# Patient Record
Sex: Male | Born: 1955 | Race: White | Hispanic: No | Smoking: Former smoker
Health system: Southern US, Community
[De-identification: ages and names within clinical notes are randomized; demographics above are authoritative.]

## PROBLEM LIST (undated history)

## (undated) DIAGNOSIS — E785 Hyperlipidemia, unspecified: Secondary | ICD-10-CM

## (undated) DIAGNOSIS — K859 Acute pancreatitis without necrosis or infection, unspecified: Secondary | ICD-10-CM

## (undated) DIAGNOSIS — N393 Stress incontinence (female) (male): Secondary | ICD-10-CM

## (undated) DIAGNOSIS — C61 Malignant neoplasm of prostate: Secondary | ICD-10-CM

## (undated) DIAGNOSIS — N529 Male erectile dysfunction, unspecified: Secondary | ICD-10-CM

## (undated) DIAGNOSIS — Z8669 Personal history of other diseases of the nervous system and sense organs: Secondary | ICD-10-CM

## (undated) DIAGNOSIS — E119 Type 2 diabetes mellitus without complications: Secondary | ICD-10-CM

## (undated) DIAGNOSIS — H269 Unspecified cataract: Secondary | ICD-10-CM

## (undated) DIAGNOSIS — Z8719 Personal history of other diseases of the digestive system: Secondary | ICD-10-CM

## (undated) DIAGNOSIS — I10 Essential (primary) hypertension: Secondary | ICD-10-CM

## (undated) DIAGNOSIS — M1990 Unspecified osteoarthritis, unspecified site: Secondary | ICD-10-CM

## (undated) DIAGNOSIS — K219 Gastro-esophageal reflux disease without esophagitis: Secondary | ICD-10-CM

## (undated) DIAGNOSIS — R51 Headache: Secondary | ICD-10-CM

## (undated) DIAGNOSIS — U071 COVID-19: Secondary | ICD-10-CM

## (undated) DIAGNOSIS — IMO0001 Reserved for inherently not codable concepts without codable children: Secondary | ICD-10-CM

## (undated) DIAGNOSIS — IMO0002 Reserved for concepts with insufficient information to code with codable children: Secondary | ICD-10-CM

## (undated) HISTORY — DX: Stress incontinence (female) (male): N39.3

## (undated) HISTORY — PX: INGUINAL HERNIA REPAIR: SUR1180

## (undated) HISTORY — PX: CHOLECYSTECTOMY, LAPAROSCOPIC: SHX56

## (undated) HISTORY — DX: Reserved for concepts with insufficient information to code with codable children: IMO0002

## (undated) HISTORY — PX: CATARACT EXTRACTION: SUR2

## (undated) HISTORY — DX: Reserved for inherently not codable concepts without codable children: IMO0001

---

## 1988-06-05 HISTORY — PX: CHOLECYSTECTOMY: SHX55

## 2011-01-09 DIAGNOSIS — C61 Malignant neoplasm of prostate: Secondary | ICD-10-CM

## 2011-01-09 HISTORY — DX: Malignant neoplasm of prostate: C61

## 2011-01-11 ENCOUNTER — Other Ambulatory Visit (HOSPITAL_COMMUNITY): Payer: Self-pay | Admitting: Urology

## 2011-01-11 DIAGNOSIS — C61 Malignant neoplasm of prostate: Secondary | ICD-10-CM

## 2011-01-19 ENCOUNTER — Encounter (HOSPITAL_COMMUNITY)
Admission: RE | Admit: 2011-01-19 | Discharge: 2011-01-19 | Disposition: A | Discharge status: Home / Self Care | Payer: Managed Care, Other (non HMO) | Source: Ambulatory Visit | Attending: Urology | Admitting: Urology

## 2011-01-19 ENCOUNTER — Encounter (HOSPITAL_COMMUNITY): Payer: Self-pay

## 2011-01-19 DIAGNOSIS — C61 Malignant neoplasm of prostate: Secondary | ICD-10-CM | POA: Insufficient documentation

## 2011-01-19 HISTORY — DX: Malignant neoplasm of prostate: C61

## 2011-01-19 MED ORDER — TECHNETIUM TC 99M MEDRONATE IV KIT
23.2000 | PACK | Freq: Once | INTRAVENOUS | Status: AC | PRN
  Administered 2011-01-19: 23.2 via INTRAVENOUS

## 2011-01-20 ENCOUNTER — Ambulatory Visit (HOSPITAL_COMMUNITY)
Admission: RE | Admit: 2011-01-20 | Discharge: 2011-01-20 | Disposition: A | Discharge status: Home / Self Care | Payer: Managed Care, Other (non HMO) | Source: Ambulatory Visit | Attending: Urology | Admitting: Urology

## 2011-01-20 DIAGNOSIS — C61 Malignant neoplasm of prostate: Secondary | ICD-10-CM | POA: Insufficient documentation

## 2011-01-20 DIAGNOSIS — N402 Nodular prostate without lower urinary tract symptoms: Secondary | ICD-10-CM | POA: Insufficient documentation

## 2011-01-20 MED ORDER — GADOBENATE DIMEGLUMINE 529 MG/ML IV SOLN
10.0000 mL | Freq: Once | INTRAVENOUS | Status: AC | PRN
  Administered 2011-01-20: 10 mL via INTRAVENOUS

## 2011-02-24 ENCOUNTER — Encounter (HOSPITAL_COMMUNITY): Payer: Managed Care, Other (non HMO)

## 2011-02-24 ENCOUNTER — Other Ambulatory Visit: Payer: Self-pay | Admitting: Urology

## 2011-02-24 ENCOUNTER — Ambulatory Visit (HOSPITAL_COMMUNITY)
Admission: RE | Admit: 2011-02-24 | Discharge: 2011-02-24 | Disposition: A | Discharge status: Home / Self Care | Payer: Managed Care, Other (non HMO) | Source: Ambulatory Visit | Attending: Urology | Admitting: Urology

## 2011-02-24 ENCOUNTER — Other Ambulatory Visit (HOSPITAL_COMMUNITY): Payer: Self-pay | Admitting: Urology

## 2011-02-24 DIAGNOSIS — C61 Malignant neoplasm of prostate: Secondary | ICD-10-CM | POA: Insufficient documentation

## 2011-02-24 DIAGNOSIS — Z01818 Encounter for other preprocedural examination: Secondary | ICD-10-CM

## 2011-02-24 DIAGNOSIS — Z01812 Encounter for preprocedural laboratory examination: Secondary | ICD-10-CM | POA: Insufficient documentation

## 2011-02-24 DIAGNOSIS — Z01811 Encounter for preprocedural respiratory examination: Secondary | ICD-10-CM | POA: Insufficient documentation

## 2011-02-24 LAB — BASIC METABOLIC PANEL
CO2: 27 mEq/L (ref 19–32)
Calcium: 10.3 mg/dL (ref 8.4–10.5)
Glucose, Bld: 118 mg/dL — ABNORMAL HIGH (ref 70–99)
Potassium: 4.4 mEq/L (ref 3.5–5.1)
Sodium: 138 mEq/L (ref 135–145)

## 2011-02-24 LAB — CBC
Hemoglobin: 15.1 g/dL (ref 13.0–17.0)
MCH: 29.5 pg (ref 26.0–34.0)
RBC: 5.12 MIL/uL (ref 4.22–5.81)

## 2011-02-24 LAB — SURGICAL PCR SCREEN: Staphylococcus aureus: NEGATIVE

## 2011-03-02 ENCOUNTER — Other Ambulatory Visit: Payer: Self-pay | Admitting: Urology

## 2011-03-02 ENCOUNTER — Inpatient Hospital Stay (HOSPITAL_COMMUNITY)
Admission: RE | Admit: 2011-03-02 | Discharge: 2011-03-03 | DRG: 708 | Disposition: A | Discharge status: Home / Self Care | Payer: Managed Care, Other (non HMO) | Source: Ambulatory Visit | Attending: Urology | Admitting: Urology

## 2011-03-02 DIAGNOSIS — E785 Hyperlipidemia, unspecified: Secondary | ICD-10-CM | POA: Diagnosis present

## 2011-03-02 DIAGNOSIS — E119 Type 2 diabetes mellitus without complications: Secondary | ICD-10-CM | POA: Diagnosis present

## 2011-03-02 DIAGNOSIS — I10 Essential (primary) hypertension: Secondary | ICD-10-CM | POA: Diagnosis present

## 2011-03-02 DIAGNOSIS — Z87891 Personal history of nicotine dependence: Secondary | ICD-10-CM

## 2011-03-02 DIAGNOSIS — C61 Malignant neoplasm of prostate: Principal | ICD-10-CM | POA: Diagnosis present

## 2011-03-02 HISTORY — PX: PROSTATE SURGERY: SHX751

## 2011-03-02 LAB — ABO/RH: ABO/RH(D): O POS

## 2011-03-02 LAB — TYPE AND SCREEN: ABO/RH(D): O POS

## 2011-03-02 LAB — HEMOGLOBIN AND HEMATOCRIT, BLOOD: Hemoglobin: 13.9 g/dL (ref 13.0–17.0)

## 2011-03-02 LAB — GLUCOSE, CAPILLARY: Glucose-Capillary: 212 mg/dL — ABNORMAL HIGH (ref 70–99)

## 2011-03-03 LAB — GLUCOSE, CAPILLARY
Glucose-Capillary: 192 mg/dL — ABNORMAL HIGH (ref 70–99)
Glucose-Capillary: 152 mg/dL — ABNORMAL HIGH (ref 70–99)

## 2011-03-06 NOTE — Op Note (Signed)
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  PROCEDURES: 1. Robotic-assisted laparoscopic radical prostatectomy (non-nerve     sparing). 2. Bilateral robotic-assisted extended pelvic lymphadenectomy.  SURGEON:  Heloise Purpura, MD  ASSISTANT:  Delia Chimes, [facility]  ANESTHESIA:  General.  COMPLICATIONS:  None.  ESTIMATED BLOOD LOSS:  100 cc.  INTRAVENOUS FLUIDS:  1500 cc of crystalloid.  SPECIMEN: 1. Prostate and seminal vesicles. 2. Right external iliac lymph nodes. 3. Right pelvic lymph nodes. 4. Left external iliac lymph nodes. 5. Left pelvic lymph nodes.  DISPOSITION:  Specimens to Pathology.  DRAINS: 1. 20-French coude catheter. 2. #19 Blake pelvic drain.  INDICATION:  Douglas Zhang is a 55 year old gentleman who was recently diagnosed with locally advanced adenocarcinoma of prostate.  He was not found to have any evidence of metastatic disease on staging evaluation and we discussed options, which included primary surgical therapy versus primary radiotherapy, understanding that he likely would require a multimodality approach.  Considering his age and excellent life expectancy, he elected to proceed with primary surgical therapy.  The above procedures were discussed including the potential risks and complications and he elected to proceed and gave his informed consent.  DESCRIPTION OF PROCEDURE:  The patient was taken to the operating room and a general anesthetic was administered.  He was given  preoperative antibiotics, placed in the dorsal lithotomy position, and prepped and draped in the usual sterile fashion.  Next, preoperative time-out was performed.  A Foley catheter was then inserted into the bladder and a site was selected just superior to the umbilicus for placement of the camera port.  This was placed using a standard open Hassan technique, which allowed entry into the peritoneal cavity under direct vision without difficulty.  A 12 mm port was then placed and the pneumoperitoneum established.  With the 0-degree lens, the abdomen was inspected and there was no evidence of any intra-abdominal injuries. There were noted to be some adhesions toward the left inguinal canal, likely a result of his prior inguinal hernia repairs, which had been performed bilaterally.  The remaining ports were then placed with 8 mm robotic ports placed in the left lower quadrant, far left lower quadrant, and right lower quadrant.  A 12 mm port was placed in the far right lateral abdominal wall and a 5-mm port was placed in the right upper quadrant.  All ports were placed under direct vision without difficulty.  The surgical cart was then docked.  The aforementioned adhesions in the left lower quadrant were then carefully taken down sharply and the bladder was then reflected posteriorly with the aid of cautery scissors allowing entry into the space of Retzius and identification of the endopelvic  fascia and prostate.  The endopelvic fascia was then incised from the apex back to the base of the prostate bilaterally and the underlying levator muscle fibers were swept laterally off the prostate thereby isolating the dorsal venous complex. The dorsal vein was then stapled and divided with a 45 mm flex echelon stapler.  The bladder neck was identified with the aid of Foley catheter manipulation and was divided anteriorly thereby exposing the catheter. The catheter balloon was deflated and the catheter  was brought into the operative field and used to retract the prostate anteriorly.  This exposed the posterior bladder neck, which appeared unremarkable and it was divided and dissection proceeded between the bladder and prostate until the vasa deferentia and seminal vesicles were identified.  The vasa deferentia were isolated, divided and lifted anteriorly and the seminal vesicles were dissected down to their tips with care to control seminal vesicle arterial blood supply.  These structures were then also lifted anteriorly and the space between the Denonvilliers fascia and the anterior rectum was developed with a combination of sharp and blunt dissection.  Due to the extent of the patient's disease and his preexisting erectile dysfunction, a wide non-nerve sparing dissection was performed bilaterally.  The vascular pedicles of the prostate were ligated with Hem-o-lok clips and divided with sharp scissor dissection. Toward the apex of the prostate, bipolar energy was used for hemostatic purposes.  The urethra was then sharply divided allowing the prostate specimen to be disarticulated.  The pelvis was copiously irrigated and hemostasis was ensured.  With the irrigation in the pelvis, air was injected into the rectal catheter.  There was no evidence of a rectal injury.  Attention then turned to the right pelvic sidewall.  The fibrofatty tissue extending from the genitofemoral nerve laterally to the bifurcation of the iliac vessels proximally, the bladder medially and the hypogastric artery inferiorly and to  Cooper's ligament distally was removed with care to preserve the obturator nerve and vascular structures.  Hem-o-lok clips were used for lymphostasis and hemostasis. These packets were sent separately as external iliac lymph nodes and pelvic lymph nodes.  An identical procedure was performed on the contralateral side in a similar fashion.  Attention then returned to the pelvis.  The  urethral anastomosis was performed after a 2-0 Vicryl slip- knot was placed between Denonvilliers fascia, the posterior bladder neck, and the posterior urethra to reapproximate these structures.  A double-armed 3-0 Monocryl suture was then used to perform a 360 degrees running tension-free anastomosis between these structures.  A new 20- [place] coude catheter was inserted into the bladder and irrigated. There were no blood clots within the bladder and the anastomosis appeared to be watertight.  A #19 Blake drain was then brought through the left robotic port and positioned appropriately within the pelvis. It was secured to the skin with a nylon suture.  Surgical cart was then undocked.  The right lateral 12 mm port site was closed with a 0 Vicryl suture placed laparoscopically.  All remaining ports were then removed under direct vision and the prostate specimen was removed intact via the periumbilical port site within the Endopouch retrieval bag.  This fascial opening was then closed with 2 running 0 Vicryl sutures.  All port sites were then injected with 4% Marcaine and reapproximated at the skin level with staples.  Sterile dressings were applied.  The patient tolerated the procedure well without complications.  He was able to be extubated and transferred to recovery unit in satisfactory  condition.     Heloise Purpura, MD     LB/MEDQ  D:  03/02/2011  T:  03/02/2011  Job:  [id number]  Electronically Signed by Heloise Purpura MD on 03/06/2011 10:31:16 AM

## 2011-12-27 ENCOUNTER — Encounter: Payer: Self-pay | Admitting: Gastroenterology

## 2012-01-31 ENCOUNTER — Ambulatory Visit (AMBULATORY_SURGERY_CENTER): Payer: BC Managed Care – PPO

## 2012-01-31 VITALS — Ht 70.0 in | Wt 206.6 lb

## 2012-01-31 DIAGNOSIS — Z8546 Personal history of malignant neoplasm of prostate: Secondary | ICD-10-CM

## 2012-01-31 DIAGNOSIS — Z1211 Encounter for screening for malignant neoplasm of colon: Secondary | ICD-10-CM

## 2012-01-31 MED ORDER — MOVIPREP 100 G PO SOLR: ORAL | Status: DC

## 2012-02-01 ENCOUNTER — Encounter: Payer: Self-pay | Admitting: Gastroenterology

## 2012-02-13 ENCOUNTER — Telehealth: Payer: Self-pay | Admitting: Gastroenterology

## 2012-02-13 NOTE — Telephone Encounter (Signed)
Questions about the prep and his diabetic meds answered.  No further questions

## 2012-02-14 ENCOUNTER — Encounter: Payer: Self-pay | Admitting: Gastroenterology

## 2012-02-14 ENCOUNTER — Ambulatory Visit (AMBULATORY_SURGERY_CENTER): Payer: BC Managed Care – PPO | Admitting: Gastroenterology

## 2012-02-14 VITALS — BP 134/91 | HR 96 | Temp 97.4°F | Resp 20 | Ht 70.0 in | Wt 206.0 lb

## 2012-02-14 DIAGNOSIS — D126 Benign neoplasm of colon, unspecified: Secondary | ICD-10-CM

## 2012-02-14 DIAGNOSIS — Z1211 Encounter for screening for malignant neoplasm of colon: Secondary | ICD-10-CM

## 2012-02-14 MED ORDER — SODIUM CHLORIDE 0.9 % IV SOLN: 500.0000 mL | INTRAVENOUS | Status: DC

## 2012-02-14 NOTE — Progress Notes (Signed)
Patient did not experience any of the following events: a burn prior to discharge; a fall within the facility; wrong site/side/patient/procedure/implant event; or a hospital transfer or hospital admission upon discharge from the facility. (G8907) Patient did not have preoperative order for IV antibiotic SSI prophylaxis. (G8918)  

## 2012-02-14 NOTE — Patient Instructions (Signed)
YOU HAD AN ENDOSCOPIC PROCEDURE TODAY AT THE [facility]: Refer to the procedure report that was given to you for any specific questions about what was found during the examination.  If the procedure report does not answer your questions, please call your gastroenterologist to clarify.  If you requested that your care partner not be given the details of your procedure findings, then the procedure report has been included in a sealed envelope for you to review at your convenience later.  YOU SHOULD EXPECT: Some feelings of bloating in the abdomen. Passage of more gas than usual.  Walking can help get rid of the air that was put into your GI tract during the procedure and reduce the bloating. If you had a lower endoscopy (such as a colonoscopy or flexible sigmoidoscopy) you may notice spotting of blood in your stool or on the toilet paper. If you underwent a bowel prep for your procedure, then you may not have a normal bowel movement for a few days.  DIET: Your first meal following the procedure should be a light meal and then it is ok to progress to your normal diet.  A half-sandwich or bowl of soup is an example of a good first meal.  Heavy or fried foods are harder to digest and may make you feel nauseous or bloated.  Likewise meals heavy in dairy and vegetables can cause extra gas to form and this can also increase the bloating.  Drink plenty of fluids but you should avoid alcoholic beverages for 24 hours.  ACTIVITY: Your care partner should take you home directly after the procedure.  You should plan to take it easy, moving slowly for the rest of the day.  You can resume normal activity the day after the procedure however you should NOT DRIVE or use heavy machinery for 24 hours (because of the sedation medicines used during the test).    SYMPTOMS TO REPORT IMMEDIATELY: A gastroenterologist can be reached at any hour.  During normal business hours, 8:30 AM to 5:00 PM Monday through Friday,  call [phone].  After hours and on weekends, please call the GI answering service at [phone] who will take a message and have the physician on call contact you.   Following lower endoscopy (colonoscopy or flexible sigmoidoscopy):  Excessive amounts of blood in the stool  Significant tenderness or worsening of abdominal pains  Swelling of the abdomen that is new, acute  Fever of 100F or higher    FOLLOW UP: If any biopsies were taken you will be contacted by phone or by letter within the next 1-3 weeks.  Call your gastroenterologist if you have not heard about the biopsies in 3 weeks.  Our staff will call the home number listed on your records the next business day following your procedure to check on you and address any questions or concerns that you may have at that time regarding the information given to you following your procedure. This is a courtesy call and so if there is no answer at the home number and we have not heard from you through the emergency physician on call, we will assume that you have returned to your regular daily activities without incident.  SIGNATURES/CONFIDENTIALITY: You and/or your care partner have signed paperwork which will be entered into your electronic medical record.  These signatures attest to the fact that that the information above on your After Visit Summary has been reviewed and is understood.  Full responsibility of the confidentiality   of this discharge information lies with you and/or your care-partner.     

## 2012-02-14 NOTE — Op Note (Addendum)
[facility] [address] [place], [address]   COLONOSCOPY PROCEDURE REPORT PATIENT: Douglas Zhang, Douglas Zhang  MR#: [id number] BIRTHDATE: Jan 11, 1956 , 55  yrs. old GENDER: Male ENDOSCOPIST: Meryl Dare, MD, [facility] REFERRED MV:HQION Duaine Dredge, M.D. PROCEDURE DATE:  02/14/2012 PROCEDURE:   Colonoscopy with snare polypectomy ASA CLASS:   Class III INDICATIONS:average risk screening. MEDICATIONS: MAC sedation, administered by CRNA and propofol (Diprivan) 280mg  IV DESCRIPTION OF PROCEDURE:   After the risks benefits and alternatives of the procedure were thoroughly explained, informed consent was obtained.  A digital rectal exam revealed no abnormalities of the rectum.   The LB CF-H180AL [id number]  endoscope was introduced through the anus and advanced to the cecum, which was identified by both the appendix and ileocecal valve. No adverse events experienced.   The quality of the prep was adequate, using MoviPrep  The instrument was then slowly withdrawn as the colon was fully examined.   COLON FINDINGS: A sessile polyp measuring 6 mm in size was found in the ascending colon.  A polypectomy was performed with a cold snare.  The resection was complete and the polyp tissue was completely retrieved.   A pedunculated polyp measuring 1 cm in size was found at the hepatic flexure.  A polypectomy was performed using snare cautery.  The resection was complete and the polyp tissue was completely retrieved.   Two sessile polyps measuring 5-7 mm in size were found in the descending colon.  A polypectomy was performed with a cold snare.  The resection was complete and the polyp tissue was completely retrieved.   The colon was otherwise normal.  There was no diverticulosis, inflamation, polyps or cancers unless previously stated.  Retroflexed views revealed internal hemorrhoids. The time to cecum=1 minutes 54 seconds. Withdrawal time=12 minutes 44 seconds.  The scope was withdrawn and the  procedure completed. COMPLICATIONS: There were no complications.  ENDOSCOPIC IMPRESSION: 1.   Sessile polyp in the ascending colon; polypectomy was performed with a cold snare 2.   Pedunculated polyp at the hepatic flexure; polypectomy was performed using snare cautery 3.   Two sessile polyps found in the descending colon; polypectomy was performed with a cold snare 4.   Internal hemorrhoids  RECOMMENDATIONS: 1.  await pathology results 2.  Repeat colonoscopy in 3 years if the 1 cm polyp is adenomatous or 3 polyps are adenomatous; 5 years if 1-2 smaller polyps adenomatous, otherwise 10 years eSigned:  Meryl Dare, MD, [facility] 02/14/2012 9:32 AM Revised: 02/14/2012 9:32 AM

## 2012-02-15 ENCOUNTER — Telehealth: Payer: Self-pay | Admitting: *Deleted

## 2012-02-15 NOTE — Telephone Encounter (Signed)
  Follow up Call-  Call back number 02/14/2012  Post procedure Call Back phone  # [phone]  Permission to leave phone message Yes     Patient questions:  Do you have a fever, pain , or abdominal swelling? no Pain Score  0 *  Have you tolerated food without any problems? yes  Have you been able to return to your normal activities? yes  Do you have any questions about your discharge instructions: Diet   no Medications  no Follow up visit  no  Do you have questions or concerns about your Care? no  Actions: * If pain score is 4 or above: No action needed, pain <4.

## 2012-02-19 ENCOUNTER — Encounter: Payer: Self-pay | Admitting: Gastroenterology

## 2012-02-27 ENCOUNTER — Other Ambulatory Visit: Payer: Self-pay | Admitting: Family Medicine

## 2012-02-27 DIAGNOSIS — R1110 Vomiting, unspecified: Secondary | ICD-10-CM

## 2012-02-27 DIAGNOSIS — R1310 Dysphagia, unspecified: Secondary | ICD-10-CM

## 2012-03-06 ENCOUNTER — Ambulatory Visit
Admission: RE | Admit: 2012-03-06 | Discharge: 2012-03-06 | Disposition: A | Discharge status: Home / Self Care | Payer: BC Managed Care – PPO | Source: Ambulatory Visit | Attending: Family Medicine | Admitting: Family Medicine

## 2012-03-06 DIAGNOSIS — R1310 Dysphagia, unspecified: Secondary | ICD-10-CM

## 2012-03-06 DIAGNOSIS — R1110 Vomiting, unspecified: Secondary | ICD-10-CM

## 2012-06-17 ENCOUNTER — Encounter: Payer: Self-pay | Admitting: *Deleted

## 2012-06-17 ENCOUNTER — Encounter: Payer: Self-pay | Admitting: Radiation Oncology

## 2012-06-17 DIAGNOSIS — N393 Stress incontinence (female) (male): Secondary | ICD-10-CM | POA: Insufficient documentation

## 2012-06-17 DIAGNOSIS — C61 Malignant neoplasm of prostate: Secondary | ICD-10-CM | POA: Insufficient documentation

## 2012-06-17 NOTE — Progress Notes (Signed)
New Consult Prostate Cancer; Elevated PSA's s/p radical Prostatectomy 03/02/11 Gleason =4+4=8.PSA=8.39,volume=50cc PSA 06/09/11=0.64 PSA 05/15/12=<0.01  Single,no children, no  down to 1-2 pads daily,occaional dysuria, nocturia 2x sometimes, weak stream, "sometimes a little bit comes out when voiding"stated patient regular bowel movements, some fatigue, Hot flashes s/p androgen deprivation therapy   Allergies:NKDA

## 2012-06-18 ENCOUNTER — Ambulatory Visit
Admission: RE | Admit: 2012-06-18 | Discharge: 2012-06-18 | Disposition: A | Discharge status: Home / Self Care | Payer: BC Managed Care – PPO | Source: Ambulatory Visit | Attending: Radiation Oncology | Admitting: Radiation Oncology

## 2012-06-18 ENCOUNTER — Encounter: Payer: Self-pay | Admitting: Radiation Oncology

## 2012-06-18 VITALS — BP 119/73 | HR 109 | Temp 97.6°F | Resp 20 | Ht 69.0 in | Wt 202.2 lb

## 2012-06-18 DIAGNOSIS — C61 Malignant neoplasm of prostate: Secondary | ICD-10-CM

## 2012-06-18 DIAGNOSIS — Z7982 Long term (current) use of aspirin: Secondary | ICD-10-CM | POA: Insufficient documentation

## 2012-06-18 DIAGNOSIS — N393 Stress incontinence (female) (male): Secondary | ICD-10-CM | POA: Insufficient documentation

## 2012-06-18 DIAGNOSIS — E119 Type 2 diabetes mellitus without complications: Secondary | ICD-10-CM | POA: Insufficient documentation

## 2012-06-18 DIAGNOSIS — I10 Essential (primary) hypertension: Secondary | ICD-10-CM | POA: Insufficient documentation

## 2012-06-18 DIAGNOSIS — Z79899 Other long term (current) drug therapy: Secondary | ICD-10-CM | POA: Insufficient documentation

## 2012-06-18 HISTORY — DX: Unspecified cataract: H26.9

## 2012-06-18 HISTORY — DX: Essential (primary) hypertension: I10

## 2012-06-18 HISTORY — DX: Personal history of other diseases of the nervous system and sense organs: Z86.69

## 2012-06-18 HISTORY — DX: Male erectile dysfunction, unspecified: N52.9

## 2012-06-18 HISTORY — DX: Type 2 diabetes mellitus without complications: E11.9

## 2012-06-18 HISTORY — DX: Acute pancreatitis without necrosis or infection, unspecified: K85.90

## 2012-06-18 HISTORY — DX: Hyperlipidemia, unspecified: E78.5

## 2012-06-18 HISTORY — DX: Gastro-esophageal reflux disease without esophagitis: K21.9

## 2012-06-18 NOTE — Progress Notes (Signed)
Please see the Nurse Progress Note in the MD Initial Consult Encounter for this patient. 

## 2012-06-18 NOTE — Progress Notes (Signed)
[facility] Radiation Oncology NEW PATIENT EVALUATION  Name: Douglas Zhang MRN: [id number]  Date:   06/18/2012           DOB: 1956/05/17  Status: outpatient   CC: Carolyne Fiscal, MD  Crecencio Mc, MD    REFERRING PHYSICIAN: Crecencio Mc, MD   DIAGNOSIS: Pathologic stage T3a adenocarcinoma prostate   HISTORY OF PRESENT ILLNESS:  Douglas Zhang is a 57 y.o. male who is a most pleasant 57 year old male who is seen today for the courtesy of Dr. Laverle Patter for consideration of post-prostatectomy radiation therapy in the management of his pathologic stage T3a adenocarcinoma prostate. He initially presented with a PSA of 8.39 prompting a biopsies by Dr. Laverle Patter on 01/09/2011. His then have extensive Gleason 8 (4+4) involving 50% of one core from the left lateral base, 30% of one core from the left base, 80% of one core from left lateral mid gland, 60% of one core from the left mid gland, and 40% of one core from left lateral apex. He had other areas of high-grade PIN. His staging workup was negative. On 03/02/2011 he underwent a unilateral nerve sparing laparoscopic radical prostatectomy by Dr. Laverle Patter. He was found to have Gleason 8 (4+4) involving 15% of the gland, less than one half of the left lobe. There was extracapsular extension of carcinoma with focal involvement of the soft tissue surgical margin of resection in the area of the left seminal vesicle, however there was no involvement of the seminal vesicles. 25 lymph nodes were free of metastatic disease. His postop PSA in November of 2012 0.36, and rose to 0.64 by January of 2013. He was then started on androgen deprivation therapy/Lupron with his PSA is becoming undetectable since then. His last PSA was in December 2013. He was prese uses nted at our GU oncology conference, and it was felt that he may be a candidate for salvage radiation therapy. He had some difficulty with incontinence following his surgery but this is much improved. He states  that he uses 2 pads a day. He is doing better on VESIcare. No GI difficulties. He does admit to hot flashes as expected. He has not had any erections since surgery.  PREVIOUS RADIATION THERAPY: No   PAST MEDICAL HISTORY:  has a past medical history of Stress incontinence, male; Prostate cancer (01/09/11); migraines; Hypertension; Hyperlipidemia; Diabetes mellitus without complication; AP (acute pancreatitis); ED (erectile dysfunction); GERD (gastroesophageal reflux disease); and Cataract.     PAST SURGICAL HISTORY:  Past Surgical History  Procedure Date  . Prostate surgery 03/02/11    gleason 4+4=8  . Cholecystectomy, laparoscopic   . Inguinal hernia repair     bi-lateral  . Cholecystectomy 1990     FAMILY HISTORY: family history includes Cancer in his maternal grandmother and Stroke in his father. His father died of a stroke at 14 and his mother died from complications of Alzheimer's disease at 24. No family history of prostate cancer.   SOCIAL HISTORY:  reports that he quit smoking about 29 years ago. His smoking use included Cigarettes. He has a 5 pack-year smoking history. He has never used smokeless tobacco. He reports that he drinks alcohol. He reports that he does not use illicit drugs. Divorced, no children. 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   ALLERGIES: Review of patient's allergies indicates no known allergies.   MEDICATIONS:  Current Outpatient Prescriptions  Medication Sig Dispense Refill  . aspirin 81 MG tablet  Take 81 mg by mouth daily.      . calcium citrate-vitamin D (CITRACAL+D) 315-200 MG-UNIT per tablet Take 1 tablet by mouth 2 (two) times daily.      . cetirizine (ZYRTEC) 10 MG tablet Take 10 mg by mouth as needed.      Marland Kitchen glipiZIDE (GLUCOTROL XL) 5 MG 24 hr tablet Take 5 mg by mouth 2 (two) times daily.      Marland Kitchen lisinopril (PRINIVIL,ZESTRIL) 20 MG tablet Take 20 mg by mouth daily.      . metFORMIN (GLUCOPHAGE)  500 MG tablet Take 500 mg by mouth 4 (four) times daily.       . Multiple Vitamin (MULTIVITAMIN WITH MINERALS) TABS Take 1 tablet by mouth daily.      Marland Kitchen omeprazole (PRILOSEC) 20 MG capsule Take 20 mg by mouth daily.      . pravastatin (PRAVACHOL) 40 MG tablet Take 40 mg by mouth daily.      . solifenacin (VESICARE) 5 MG tablet Take 10 mg by mouth as directed.      . SUMAtriptan (IMITREX) 100 MG tablet Take 100 mg by mouth every 2 (two) hours as needed.         REVIEW OF SYSTEMS:  Pertinent items are noted in HPI.    PHYSICAL EXAM:  height is 5\' 9"  (1.753 m) and weight is 202 lb 3.2 oz (91.717 kg). His oral temperature is 97.6 F (36.4 C). His blood pressure is 119/73 and his pulse is 109. His respiration is 20.   Alert and oriented 57 year old white male appearing his stated age. Head and neck examination: Grossly unremarkable. Nodes: Without palpable cervical or supraclavicular lymphadenopathy. Chest: Lungs clear. Back: Without spinal or CVA tenderness. Heart: Regular in rhythm. Abdomen: Without masses organomegaly. Genitalia: Unremarkable to inspection. Rectal: Prostate bed without masses or nodularity. Extremities: Without edema. Neurologic examination: Grossly nonfocal.   LABORATORY DATA:  Lab Results  Component Value Date   WBC 6.6 02/24/2011   HGB 13.1 03/03/2011   HCT 38.8* 03/03/2011   MCV 86.1 02/24/2011   PLT 175 02/24/2011   Lab Results  Component Value Date   NA 138 02/24/2011   K 4.4 02/24/2011   CL 102 02/24/2011   CO2 27 02/24/2011   No results found for this basename: ALT, AST, GGT, ALKPHOS, BILITOT   PSA from 05/15/2012 undetectable.   IMPRESSION: Pathologic stage T3a N0 Gleason 8 (4+4) adenocarcinoma with a focal positive soft tissue margin the left seminal vesicle. I explained to the patient and his sister that the risk for local failure is increased with margin positivity. On the other hand, he has competing risks factors for disseminated disease based on his Gleason  score of 8. Likelihood for successful salvage his low, but in view of his young age, absence of nodal spread, and a positive margin I would be willing to offer him post prostatectomy/salvage radiation therapy. We discussed the potential acute and late toxicities of radiation therapy. We also discussed treatment with a comfortably full bladder if this is possible . Dr. Laverle Patter plans on holding off on any further and a deprivation therapy so we can follow his PSA. Consent is signed today. I will have her return by early next week for simulation/treatment planning.   PLAN: As discussed above.   I spent 60 minutes minutes face to face with the patient and more than 50% of that time was spent in counseling and/or coordination of care.

## 2012-06-24 ENCOUNTER — Ambulatory Visit
Admission: RE | Admit: 2012-06-24 | Discharge: 2012-06-24 | Disposition: A | Discharge status: Home / Self Care | Payer: BC Managed Care – PPO | Source: Ambulatory Visit | Attending: Radiation Oncology | Admitting: Radiation Oncology

## 2012-06-24 ENCOUNTER — Telehealth: Payer: Self-pay | Admitting: Radiation Oncology

## 2012-06-24 DIAGNOSIS — Z510 Encounter for antineoplastic radiation therapy: Secondary | ICD-10-CM | POA: Insufficient documentation

## 2012-06-24 DIAGNOSIS — C61 Malignant neoplasm of prostate: Secondary | ICD-10-CM | POA: Insufficient documentation

## 2012-06-24 DIAGNOSIS — R351 Nocturia: Secondary | ICD-10-CM | POA: Insufficient documentation

## 2012-06-24 NOTE — Progress Notes (Signed)
Simulation/treatment planning note: Mr. Douglas Zhang was taken to the CT simulator. A VAC LOC immobilization device was constructed. A red rubber cath was placed within the rectal vault. He was in catheterized and contrast instilled into the bladder/urethra. I contoured his high-risk prostate bed, CTV 6600 and also low risk prostate bed, CTV 5445. He should these were expanded by 0.5 cm to create respective planning target volumes. I contoured his bladder and rectum as well. I'm prescribing 6006 were centigrays to his high-risk prostate bed PTV and 5000 445 cGy to his low risk prostate bed PTV. I requesting daily MV CT, setting up to his anterior rectum. He is be treated with a comfortably full bladder. He is now ready for IMRT simulation/treatment planning.

## 2012-06-24 NOTE — Telephone Encounter (Signed)
Met w patient to discuss RO billing. Pt had no financial concerns today.  Dx: Prostate  Attending Rad: RM  Rad Tx: IMRT x 33

## 2012-06-27 ENCOUNTER — Encounter: Payer: Self-pay | Admitting: Radiation Oncology

## 2012-06-27 NOTE — Progress Notes (Signed)
IMRT simulation/treatment planning note:  The patient underwent IMRT simulation/treatment planning in the management of his carcinoma the prostate. IMRT was chosen to decrease the risk for both acute and late bladder and rectal toxicity compared to 3-D conformal or conventional radiation therapy. Dose fine histograms were obtained for the high-risk prostate bed and also avoidance structures including the bladder and rectum. We met our departmental guidelines. He will receive 6600 cGy in 33 sessions utilizing 6 MV photons, helical IMRT Tomotherapy. I requesting daily MV CT for image guidance, setting up to his anterior rectum. He is be treated with a comfortably full bladder.

## 2012-07-03 ENCOUNTER — Ambulatory Visit
Admission: RE | Admit: 2012-07-03 | Discharge: 2012-07-03 | Disposition: A | Discharge status: Home / Self Care | Payer: BC Managed Care – PPO | Source: Ambulatory Visit | Attending: Radiation Oncology | Admitting: Radiation Oncology

## 2012-07-03 ENCOUNTER — Encounter: Payer: Self-pay | Admitting: Radiation Oncology

## 2012-07-03 NOTE — Progress Notes (Signed)
Chart note: Mr. Douglas Zhang began his radiation therapy today to his prostate bed. He is being treated with 6 MV photons, helical IMRT. He is being treated to 6.9 delivered field widths corresponding to one set of IMRT treatment devices [phone]).

## 2012-07-04 ENCOUNTER — Ambulatory Visit
Admission: RE | Admit: 2012-07-04 | Discharge: 2012-07-04 | Disposition: A | Discharge status: Home / Self Care | Payer: BC Managed Care – PPO | Source: Ambulatory Visit | Attending: Radiation Oncology | Admitting: Radiation Oncology

## 2012-07-05 ENCOUNTER — Ambulatory Visit
Admission: RE | Admit: 2012-07-05 | Discharge: 2012-07-05 | Disposition: A | Discharge status: Home / Self Care | Payer: BC Managed Care – PPO | Source: Ambulatory Visit | Attending: Radiation Oncology | Admitting: Radiation Oncology

## 2012-07-08 ENCOUNTER — Ambulatory Visit
Admission: RE | Admit: 2012-07-08 | Discharge: 2012-07-08 | Disposition: A | Discharge status: Home / Self Care | Payer: BC Managed Care – PPO | Source: Ambulatory Visit | Attending: Radiation Oncology | Admitting: Radiation Oncology

## 2012-07-08 ENCOUNTER — Encounter: Payer: Self-pay | Admitting: Radiation Oncology

## 2012-07-08 VITALS — BP 129/86 | HR 92 | Temp 98.1°F | Ht 69.0 in | Wt 207.2 lb

## 2012-07-08 DIAGNOSIS — C61 Malignant neoplasm of prostate: Secondary | ICD-10-CM

## 2012-07-08 NOTE — Progress Notes (Signed)
Weekly Management Note:  Site: Prostate bed Current Dose:  800  cGy Projected Dose: 6600  cGy  Narrative: The patient is seen today for routine under treatment assessment. CBCT/MVCT images/port films were reviewed. The chart was reviewed.   Bladder filling satisfactory. No new GU or GI difficulties.  Physical Examination:  Filed Vitals:   07/08/12 1636  BP: 129/86  Pulse: 92  Temp: 98.1 F (36.7 C)  .  Weight: 207 lb 3.2 oz (93.985 kg). No change.   Impression: Tolerating radiation therapy well.  Plan: Continue radiation therapy as planned.

## 2012-07-08 NOTE — Progress Notes (Signed)
Douglas Zhang has received 4 fractions to pelvis for prostate cancer.  He denies any pain or marked change in his urination.

## 2012-07-09 ENCOUNTER — Ambulatory Visit
Admission: RE | Admit: 2012-07-09 | Discharge: 2012-07-09 | Disposition: A | Discharge status: Home / Self Care | Payer: BC Managed Care – PPO | Source: Ambulatory Visit | Attending: Radiation Oncology | Admitting: Radiation Oncology

## 2012-07-10 ENCOUNTER — Ambulatory Visit
Admission: RE | Admit: 2012-07-10 | Discharge: 2012-07-10 | Disposition: A | Discharge status: Home / Self Care | Payer: BC Managed Care – PPO | Source: Ambulatory Visit | Attending: Radiation Oncology | Admitting: Radiation Oncology

## 2012-07-11 ENCOUNTER — Ambulatory Visit
Admission: RE | Admit: 2012-07-11 | Discharge: 2012-07-11 | Disposition: A | Discharge status: Home / Self Care | Payer: BC Managed Care – PPO | Source: Ambulatory Visit | Attending: Radiation Oncology | Admitting: Radiation Oncology

## 2012-07-12 ENCOUNTER — Ambulatory Visit
Admission: RE | Admit: 2012-07-12 | Discharge: 2012-07-12 | Disposition: A | Discharge status: Home / Self Care | Payer: BC Managed Care – PPO | Source: Ambulatory Visit | Attending: Radiation Oncology | Admitting: Radiation Oncology

## 2012-07-14 ENCOUNTER — Encounter: Payer: Self-pay | Admitting: Radiation Oncology

## 2012-07-15 ENCOUNTER — Ambulatory Visit
Admission: RE | Admit: 2012-07-15 | Discharge: 2012-07-15 | Disposition: A | Discharge status: Home / Self Care | Payer: BC Managed Care – PPO | Source: Ambulatory Visit | Attending: Radiation Oncology | Admitting: Radiation Oncology

## 2012-07-15 ENCOUNTER — Encounter: Payer: Self-pay | Admitting: Radiation Oncology

## 2012-07-15 VITALS — BP 124/80 | HR 96 | Temp 98.0°F | Resp 20 | Wt 205.5 lb

## 2012-07-15 DIAGNOSIS — C61 Malignant neoplasm of prostate: Secondary | ICD-10-CM

## 2012-07-15 NOTE — Progress Notes (Signed)
Post sim ed completed w/pt and 2 family members. Gave pt "Radiation and You" booklet w/all pertinent information marked and discussed, re: fatigue, urinary issues/care, nutrition, pain. Pt and family verbalized understanding, no questions verbalized.

## 2012-07-15 NOTE — Progress Notes (Signed)
Pt denies pain, loss of appetite, urinary/bowel issues. He has urinary urgency which is his baseline. Post sim done, charted under pt education appt.

## 2012-07-15 NOTE — Progress Notes (Signed)
Weekly Management Note:  Site: Prostate bed Current Dose:  1800  cGy Projected Dose: 6600  cGy  Narrative: The patient is seen today for routine under treatment assessment. CBCT/MVCT images/port films were reviewed. The chart was reviewed.   Satisfactory bladder filling today. He does have intermittent fatigue which is common for him. No GU or GI difficulties.  Physical Examination:  Filed Vitals:   07/15/12 1419  BP: 124/80  Pulse: 96  Temp: 98 F (36.7 C)  Resp: 20  .  Weight: 205 lb 8 oz (93.214 kg). No change .  Impression: Tolerating radiation therapy well.  Plan: Continue radiation therapy as planned.

## 2012-07-16 ENCOUNTER — Ambulatory Visit
Admission: RE | Admit: 2012-07-16 | Discharge: 2012-07-16 | Disposition: A | Discharge status: Home / Self Care | Payer: BC Managed Care – PPO | Source: Ambulatory Visit | Attending: Radiation Oncology | Admitting: Radiation Oncology

## 2012-07-17 ENCOUNTER — Ambulatory Visit
Admission: RE | Admit: 2012-07-17 | Discharge: 2012-07-17 | Disposition: A | Discharge status: Home / Self Care | Payer: BC Managed Care – PPO | Source: Ambulatory Visit | Attending: Radiation Oncology | Admitting: Radiation Oncology

## 2012-07-18 ENCOUNTER — Ambulatory Visit: Payer: BC Managed Care – PPO

## 2012-07-19 ENCOUNTER — Ambulatory Visit
Admission: RE | Admit: 2012-07-19 | Discharge: 2012-07-19 | Disposition: A | Discharge status: Home / Self Care | Payer: BC Managed Care – PPO | Source: Ambulatory Visit | Attending: Radiation Oncology | Admitting: Radiation Oncology

## 2012-07-22 ENCOUNTER — Encounter: Payer: Self-pay | Admitting: Radiation Oncology

## 2012-07-22 ENCOUNTER — Ambulatory Visit
Admission: RE | Admit: 2012-07-22 | Discharge: 2012-07-22 | Disposition: A | Discharge status: Home / Self Care | Payer: BC Managed Care – PPO | Source: Ambulatory Visit | Attending: Radiation Oncology | Admitting: Radiation Oncology

## 2012-07-22 VITALS — BP 129/93 | HR 97 | Temp 97.5°F | Resp 20 | Wt 204.6 lb

## 2012-07-22 DIAGNOSIS — C61 Malignant neoplasm of prostate: Secondary | ICD-10-CM

## 2012-07-22 NOTE — Progress Notes (Signed)
Weekly Management Note:  Site: Prostate bed Current Dose:  2600  cGy Projected Dose: 6600  cGy  Narrative: The patient is seen today for routine under treatment assessment. CBCT/MVCT images/port films were reviewed. The chart was reviewed.   Bladder filling appears to be suboptimal today. He tells me he has difficulty maintaining a full bladder. He is on VESIcare 10 mg daily.  Physical Examination:  Filed Vitals:   07/22/12 1403  BP: 129/93  Pulse: 97  Temp: 97.5 F (36.4 C)  Resp: 20  .  Weight: 204 lb 9.6 oz (92.806 kg). No change  Impression: Tolerating radiation therapy well. I encouraged him to make an extra effort to try to keep his bladder full.  Plan: Continue radiation therapy as planned.

## 2012-07-22 NOTE — Progress Notes (Signed)
Pt denies pain, loss of appetite, bowel issues. He reports urinary urgency,  nocturia x 2-3, fatigue which he states is his usual since beginning hormone therapy.

## 2012-07-23 ENCOUNTER — Ambulatory Visit
Admission: RE | Admit: 2012-07-23 | Discharge: 2012-07-23 | Disposition: A | Discharge status: Home / Self Care | Payer: BC Managed Care – PPO | Source: Ambulatory Visit | Attending: Radiation Oncology | Admitting: Radiation Oncology

## 2012-07-24 ENCOUNTER — Ambulatory Visit
Admission: RE | Admit: 2012-07-24 | Discharge: 2012-07-24 | Disposition: A | Discharge status: Home / Self Care | Payer: BC Managed Care – PPO | Source: Ambulatory Visit | Attending: Radiation Oncology | Admitting: Radiation Oncology

## 2012-07-25 ENCOUNTER — Ambulatory Visit
Admission: RE | Admit: 2012-07-25 | Discharge: 2012-07-25 | Disposition: A | Discharge status: Home / Self Care | Payer: BC Managed Care – PPO | Source: Ambulatory Visit | Attending: Radiation Oncology | Admitting: Radiation Oncology

## 2012-07-26 ENCOUNTER — Ambulatory Visit
Admission: RE | Admit: 2012-07-26 | Discharge: 2012-07-26 | Disposition: A | Discharge status: Home / Self Care | Payer: BC Managed Care – PPO | Source: Ambulatory Visit | Attending: Radiation Oncology | Admitting: Radiation Oncology

## 2012-07-29 ENCOUNTER — Ambulatory Visit
Admission: RE | Admit: 2012-07-29 | Payer: BC Managed Care – PPO | Source: Ambulatory Visit | Admitting: Radiation Oncology

## 2012-07-29 ENCOUNTER — Encounter: Payer: Self-pay | Admitting: Radiation Oncology

## 2012-07-29 ENCOUNTER — Ambulatory Visit
Admission: RE | Admit: 2012-07-29 | Discharge: 2012-07-29 | Disposition: A | Discharge status: Home / Self Care | Payer: BC Managed Care – PPO | Source: Ambulatory Visit | Attending: Radiation Oncology | Admitting: Radiation Oncology

## 2012-07-29 VITALS — BP 139/72 | HR 79 | Temp 97.6°F | Wt 203.8 lb

## 2012-07-29 DIAGNOSIS — C61 Malignant neoplasm of prostate: Secondary | ICD-10-CM

## 2012-07-29 NOTE — Progress Notes (Signed)
Weekly Management Note:  Site: Prostate bed Current Dose:  3600  cGy Projected Dose: 6600  cGy  Narrative: The patient is seen today for routine under treatment assessment. CBCT/MVCT images/port films were reviewed. The chart was reviewed.   Satisfactory bladder filling. No new GU or GI difficulties although he does report nocturia x3-4.  Physical Examination:  Filed Vitals:   07/29/12 1411  BP: 139/72  Pulse: 79  Temp: 97.6 F (36.4 C)  .  Weight: 203 lb 12.8 oz (92.443 kg). No change.  Impression: Tolerating radiation therapy well.  Plan: Continue radiation therapy as planned.

## 2012-07-29 NOTE — Progress Notes (Signed)
States he notes increased frequency of urination with urgency as well as increase in nocturia from 2 x nightly to 3.  He also notes that when he voids he has to havea BM, but he denies any proctitis.

## 2012-07-30 ENCOUNTER — Ambulatory Visit
Admission: RE | Admit: 2012-07-30 | Discharge: 2012-07-30 | Disposition: A | Discharge status: Home / Self Care | Payer: BC Managed Care – PPO | Source: Ambulatory Visit | Attending: Radiation Oncology | Admitting: Radiation Oncology

## 2012-07-31 ENCOUNTER — Ambulatory Visit
Admission: RE | Admit: 2012-07-31 | Discharge: 2012-07-31 | Disposition: A | Discharge status: Home / Self Care | Payer: BC Managed Care – PPO | Source: Ambulatory Visit | Attending: Radiation Oncology | Admitting: Radiation Oncology

## 2012-08-01 ENCOUNTER — Ambulatory Visit
Admission: RE | Admit: 2012-08-01 | Discharge: 2012-08-01 | Disposition: A | Discharge status: Home / Self Care | Payer: BC Managed Care – PPO | Source: Ambulatory Visit | Attending: Radiation Oncology | Admitting: Radiation Oncology

## 2012-08-02 ENCOUNTER — Ambulatory Visit
Admission: RE | Admit: 2012-08-02 | Discharge: 2012-08-02 | Disposition: A | Discharge status: Home / Self Care | Payer: BC Managed Care – PPO | Source: Ambulatory Visit | Attending: Radiation Oncology | Admitting: Radiation Oncology

## 2012-08-05 ENCOUNTER — Ambulatory Visit
Admission: RE | Admit: 2012-08-05 | Discharge: 2012-08-05 | Disposition: A | Discharge status: Home / Self Care | Payer: BC Managed Care – PPO | Source: Ambulatory Visit | Attending: Radiation Oncology | Admitting: Radiation Oncology

## 2012-08-05 VITALS — BP 123/80 | HR 98 | Temp 97.7°F | Wt 202.6 lb

## 2012-08-05 DIAGNOSIS — C61 Malignant neoplasm of prostate: Secondary | ICD-10-CM

## 2012-08-05 NOTE — Progress Notes (Signed)
Douglas Zhang is here for his weekly under treatment visit.  He denies pain, incontinence, frequency of urination or trouble maintaining urinary stream.  He does state that he is getting up 4-5 times each night to urinate.  He also states that he does have fatigue.  He has been trying to take naps during the day.  He also has diarrhea once a day in the morning.  He thinks this may be due to what he is eating.

## 2012-08-05 NOTE — Progress Notes (Signed)
Weekly Management Note:  Site: Prostate bed Current Dose:  4600  cGy Projected Dose: 6600  cGy  Narrative: The patient is seen today for routine under treatment assessment. CBCT/MVCT images/port films were reviewed. The chart was reviewed.   Bladder filling is suboptimal today. Continues to have nocturia x4-5, and this is been a chronic problem predating his radiation therapy. No GI difficulties.  Physical Examination:  Filed Vitals:   08/05/12 1329  BP: 123/80  Pulse: 98  Temp: 97.7 F (36.5 C)  .  Weight: 202 lb 9.6 oz (91.899 kg). No change  Impression: Tolerating radiation therapy well. I encouraged him to improve his bladder filling.  Plan: Continue radiation therapy as planned.

## 2012-08-06 ENCOUNTER — Ambulatory Visit
Admission: RE | Admit: 2012-08-06 | Discharge: 2012-08-06 | Disposition: A | Discharge status: Home / Self Care | Payer: BC Managed Care – PPO | Source: Ambulatory Visit | Attending: Radiation Oncology | Admitting: Radiation Oncology

## 2012-08-07 ENCOUNTER — Ambulatory Visit
Admission: RE | Admit: 2012-08-07 | Discharge: 2012-08-07 | Disposition: A | Discharge status: Home / Self Care | Payer: BC Managed Care – PPO | Source: Ambulatory Visit | Attending: Radiation Oncology | Admitting: Radiation Oncology

## 2012-08-08 ENCOUNTER — Ambulatory Visit: Admission: RE | Admit: 2012-08-08 | Payer: BC Managed Care – PPO | Source: Ambulatory Visit

## 2012-08-09 ENCOUNTER — Ambulatory Visit: Admission: RE | Admit: 2012-08-09 | Payer: BC Managed Care – PPO | Source: Ambulatory Visit

## 2012-08-12 ENCOUNTER — Ambulatory Visit
Admission: RE | Admit: 2012-08-12 | Discharge: 2012-08-12 | Disposition: A | Discharge status: Home / Self Care | Payer: BC Managed Care – PPO | Source: Ambulatory Visit | Attending: Radiation Oncology | Admitting: Radiation Oncology

## 2012-08-12 ENCOUNTER — Encounter: Payer: Self-pay | Admitting: Radiation Oncology

## 2012-08-12 VITALS — BP 123/78 | HR 93 | Temp 97.5°F | Resp 20 | Wt 204.6 lb

## 2012-08-12 DIAGNOSIS — C61 Malignant neoplasm of prostate: Secondary | ICD-10-CM

## 2012-08-12 NOTE — Progress Notes (Signed)
Pt continues to have fatigue, states maybe one loose BM daily. He denies pain, urinary incontinence, dysuria. He states since beginning Vesicare he does not have incontinence but still has nocturia x 3-4.

## 2012-08-12 NOTE — Addendum Note (Signed)
Encounter addended by: Glennie Hawk, RN on: 08/12/2012  3:05 PM<BR>     Documentation filed: Inpatient Document Flowsheet

## 2012-08-12 NOTE — Progress Notes (Signed)
Weekly Management Note:  Site: Prostate bed Current Dose:  5200  cGy Projected Dose: 6600  cGy  Narrative: The patient is seen today for routine under treatment assessment. CBCT/MVCT images/port films were reviewed. The chart was reviewed.   Bladder filling is less than ideal today. This was reviewed with the patient. No change in his GU or GI habits.  Physical Examination:  Filed Vitals:   08/12/12 1347  BP: 123/78  Pulse: 93  Temp: 97.5 F (36.4 C)  Resp: 20  .  Weight: 204 lb 9.6 oz (92.806 kg). No change.  Impression: Tolerating radiation therapy well. He'll finish his treatment next Wednesday. I again emphasized the need for comfortably full bladder.  Plan: Continue radiation therapy as planned.

## 2012-08-13 ENCOUNTER — Ambulatory Visit
Admission: RE | Admit: 2012-08-13 | Discharge: 2012-08-13 | Disposition: A | Discharge status: Home / Self Care | Payer: BC Managed Care – PPO | Source: Ambulatory Visit | Attending: Radiation Oncology | Admitting: Radiation Oncology

## 2012-08-14 ENCOUNTER — Ambulatory Visit
Admission: RE | Admit: 2012-08-14 | Discharge: 2012-08-14 | Disposition: A | Discharge status: Home / Self Care | Payer: BC Managed Care – PPO | Source: Ambulatory Visit | Attending: Radiation Oncology | Admitting: Radiation Oncology

## 2012-08-15 ENCOUNTER — Ambulatory Visit
Admission: RE | Admit: 2012-08-15 | Discharge: 2012-08-15 | Disposition: A | Discharge status: Home / Self Care | Payer: BC Managed Care – PPO | Source: Ambulatory Visit | Attending: Radiation Oncology | Admitting: Radiation Oncology

## 2012-08-16 ENCOUNTER — Ambulatory Visit
Admission: RE | Admit: 2012-08-16 | Discharge: 2012-08-16 | Disposition: A | Discharge status: Home / Self Care | Payer: BC Managed Care – PPO | Source: Ambulatory Visit | Attending: Radiation Oncology | Admitting: Radiation Oncology

## 2012-08-16 ENCOUNTER — Ambulatory Visit: Payer: BC Managed Care – PPO

## 2012-08-19 ENCOUNTER — Ambulatory Visit: Payer: BC Managed Care – PPO

## 2012-08-19 ENCOUNTER — Encounter: Payer: Self-pay | Admitting: Radiation Oncology

## 2012-08-19 ENCOUNTER — Ambulatory Visit
Admission: RE | Admit: 2012-08-19 | Discharge: 2012-08-19 | Disposition: A | Discharge status: Home / Self Care | Payer: BC Managed Care – PPO | Source: Ambulatory Visit | Attending: Radiation Oncology | Admitting: Radiation Oncology

## 2012-08-19 VITALS — BP 122/74 | HR 96 | Temp 97.4°F | Resp 20 | Wt 205.8 lb

## 2012-08-19 DIAGNOSIS — C61 Malignant neoplasm of prostate: Secondary | ICD-10-CM

## 2012-08-19 NOTE — Progress Notes (Signed)
Weekly rad txs prostate 30/33 completed, alert, x 3,  oriented ,no dysuria, nocturia 3-4x, some frequency  At times,  Drinks till 10pm at night, regular bowel movements, eating well, drinking plenty fluids, some fatigue 1:19 PM

## 2012-08-19 NOTE — Progress Notes (Signed)
   Weekly Management Note:  outpatient Current Dose:  60 Gy  Projected Dose: 66 Gy   Narrative:  The patient presents for routine under treatment assessment.  CBCT/MVCT images/Port film x-rays were reviewed.  The chart was checked. He is doing well. Does have loose stool in the AM. Urinary sx are stable  Physical Findings:  weight is 205 lb 12.8 oz (93.35 kg). His oral temperature is 97.4 F (36.3 C). His blood pressure is 122/74 and his pulse is 96. His respiration is 20.   Well appearing  Impression:  The patient is tolerating radiotherapy.  Plan:  Continue radiotherapy as planned.   ________________________________   Lonie Peak, M.D.

## 2012-08-20 ENCOUNTER — Ambulatory Visit: Payer: BC Managed Care – PPO

## 2012-08-20 ENCOUNTER — Ambulatory Visit
Admission: RE | Admit: 2012-08-20 | Discharge: 2012-08-20 | Disposition: A | Discharge status: Home / Self Care | Payer: BC Managed Care – PPO | Source: Ambulatory Visit | Attending: Radiation Oncology | Admitting: Radiation Oncology

## 2012-08-21 ENCOUNTER — Ambulatory Visit: Payer: BC Managed Care – PPO

## 2012-08-21 ENCOUNTER — Ambulatory Visit
Admission: RE | Admit: 2012-08-21 | Discharge: 2012-08-21 | Disposition: A | Discharge status: Home / Self Care | Payer: BC Managed Care – PPO | Source: Ambulatory Visit | Attending: Radiation Oncology | Admitting: Radiation Oncology

## 2012-08-21 VITALS — BP 129/85 | HR 87 | Temp 97.8°F | Wt 204.7 lb

## 2012-08-21 DIAGNOSIS — C61 Malignant neoplasm of prostate: Secondary | ICD-10-CM

## 2012-08-21 NOTE — Progress Notes (Addendum)
Douglas Zhang here for final treatment visit.  He has had 33/33 fractions to his prostate.  He denies pain and hematuria at this time.  He does have urinary frequency and urgency.  He has to get up 3/4 time a night to urinate.  He was having loose stools earlier in the week and Dr. Basilio Cairo advised him to take imodium.  He states that this has really helped and only had one stool today.  He also states that he has fatigue.

## 2012-08-21 NOTE — Progress Notes (Signed)
Weekly Management Note Current Dose:  66 Gy  Projected Dose: 66 Gy   Narrative:  The patient presents for routine under treatment assessment.  CBCT/MVCT images/Port film x-rays were reviewed.  The chart was checked. Doing well. Stools better with immodium. Nocturia stable.   Physical Findings: Weight: 204 lb 11.2 oz (92.851 kg). Pleasant, no distress.  Impression:  The patient finishes RT today.   Plan:  F/u 1 month. Call with questions or problems sooner.

## 2012-08-22 ENCOUNTER — Ambulatory Visit: Payer: BC Managed Care – PPO

## 2012-08-23 ENCOUNTER — Ambulatory Visit: Payer: BC Managed Care – PPO

## 2012-08-24 ENCOUNTER — Encounter: Payer: Self-pay | Admitting: Radiation Oncology

## 2012-08-24 NOTE — Progress Notes (Signed)
[facility] Radiation Oncology End of Treatment Note  Name:Douglas Zhang  Date: 08/24/2012 WUJ:[id number] DOB:10-Apr-1956   Status:outpatient    CC: Carolyne Fiscal, MD ,  Dr. Crecencio Mc  REFERRING PHYSICIAN:  Dr. Crecencio Mc    DIAGNOSIS: Pathologic stage T3a, PSA recurrent carcinoma the prostate   INDICATION FOR TREATMENT: Curative   TREATMENT DATES: 07/03/2002 T2 08/21/2012                          SITE/DOSE:   High-risk prostate bed 6600 cGy 33 sessions                         BEAMS/ENERGY:    6 MV photons helical IMRT Tomotherapy             NARRATIVE:   [name] tolerated treatment well with no significant GU or GI toxicity by completion of therapy.                        PLAN: Routine followup in one month. Patient instructed to call if questions or worsening complaints in interim.

## 2012-08-26 ENCOUNTER — Ambulatory Visit: Payer: BC Managed Care – PPO

## 2012-08-27 ENCOUNTER — Ambulatory Visit: Payer: BC Managed Care – PPO

## 2012-09-19 ENCOUNTER — Encounter: Payer: Self-pay | Admitting: Oncology

## 2012-09-25 ENCOUNTER — Ambulatory Visit
Admission: RE | Admit: 2012-09-25 | Discharge: 2012-09-25 | Disposition: A | Discharge status: Home / Self Care | Payer: BC Managed Care – PPO | Source: Ambulatory Visit | Attending: Radiation Oncology | Admitting: Radiation Oncology

## 2012-09-25 VITALS — BP 151/89 | HR 94 | Temp 98.2°F | Ht 69.0 in | Wt 202.2 lb

## 2012-09-25 DIAGNOSIS — C61 Malignant neoplasm of prostate: Secondary | ICD-10-CM

## 2012-09-25 NOTE — Progress Notes (Signed)
CC: Dr. Crecencio Mc, Dr. Mosetta Putt  Followup note: Douglas Zhang returns today approximately 1 month following completion of postoperative radiation therapy in the management of his pathologic stageT3a/PSA recurrent carcinoma the prostate. He believes that he is back to his baseline urinary habits. He still having slight dysuria, urinary frequency along with nocturia x4-5. He is on VESIcare. He occasionally has loose bowel movements for which he takes an occasional Imodium, but not every day. He is pleased with his progress. He sees Dr. Laverle Patter for a followup visit and PSA determination in mid May.  Physical examination: Alert and oriented. Filed Vitals:   09/25/12 0954  BP: 151/89  Pulse: 94  Temp: 98.2 F (36.8 C)   He's not examined today.  Impression: Clinically stable.  Plan: He will see Dr. Laverle Patter for a followup visit in May. He'll have a PSA determination at that time. I've not scheduled the patient for a formal followup visit and I ask that Dr. Laverle Patter keep me posted on his progress.

## 2012-09-25 NOTE — Progress Notes (Signed)
Douglas Zhang here for follow up after 33 fractions to his prostate.  He denies pain.  He does have fatigue.  He reports occasional burning with urination and frequency.  He needs to get up 4-5 times a night to urinate.  He denies hematuria.  He was having diarrhea during treatment and has been taking Imodium every other day which he states has stopped the diarrhea.

## 2013-05-17 ENCOUNTER — Emergency Department (HOSPITAL_COMMUNITY)
Admission: EM | Admit: 2013-05-17 | Discharge: 2013-05-17 | Disposition: A | Discharge status: Home / Self Care | Payer: BC Managed Care – PPO | Attending: Emergency Medicine | Admitting: Emergency Medicine

## 2013-05-17 ENCOUNTER — Encounter (HOSPITAL_COMMUNITY): Payer: Self-pay | Admitting: Emergency Medicine

## 2013-05-17 DIAGNOSIS — Z87891 Personal history of nicotine dependence: Secondary | ICD-10-CM | POA: Insufficient documentation

## 2013-05-17 DIAGNOSIS — K921 Melena: Secondary | ICD-10-CM | POA: Insufficient documentation

## 2013-05-17 DIAGNOSIS — Z923 Personal history of irradiation: Secondary | ICD-10-CM | POA: Insufficient documentation

## 2013-05-17 DIAGNOSIS — E785 Hyperlipidemia, unspecified: Secondary | ICD-10-CM | POA: Insufficient documentation

## 2013-05-17 DIAGNOSIS — G43909 Migraine, unspecified, not intractable, without status migrainosus: Secondary | ICD-10-CM | POA: Insufficient documentation

## 2013-05-17 DIAGNOSIS — Z8669 Personal history of other diseases of the nervous system and sense organs: Secondary | ICD-10-CM | POA: Insufficient documentation

## 2013-05-17 DIAGNOSIS — E119 Type 2 diabetes mellitus without complications: Secondary | ICD-10-CM | POA: Insufficient documentation

## 2013-05-17 DIAGNOSIS — R319 Hematuria, unspecified: Secondary | ICD-10-CM | POA: Insufficient documentation

## 2013-05-17 DIAGNOSIS — Z8546 Personal history of malignant neoplasm of prostate: Secondary | ICD-10-CM | POA: Insufficient documentation

## 2013-05-17 DIAGNOSIS — I10 Essential (primary) hypertension: Secondary | ICD-10-CM | POA: Insufficient documentation

## 2013-05-17 DIAGNOSIS — R5381 Other malaise: Secondary | ICD-10-CM | POA: Insufficient documentation

## 2013-05-17 DIAGNOSIS — Z79899 Other long term (current) drug therapy: Secondary | ICD-10-CM | POA: Insufficient documentation

## 2013-05-17 DIAGNOSIS — K219 Gastro-esophageal reflux disease without esophagitis: Secondary | ICD-10-CM | POA: Insufficient documentation

## 2013-05-17 DIAGNOSIS — R300 Dysuria: Secondary | ICD-10-CM | POA: Insufficient documentation

## 2013-05-17 DIAGNOSIS — Z794 Long term (current) use of insulin: Secondary | ICD-10-CM | POA: Insufficient documentation

## 2013-05-17 LAB — COMPREHENSIVE METABOLIC PANEL
ALT: 32 U/L (ref 0–53)
AST: 29 U/L (ref 0–37)
Albumin: 4.2 g/dL (ref 3.5–5.2)
Calcium: 9.8 mg/dL (ref 8.4–10.5)
Creatinine, Ser: 1.27 mg/dL (ref 0.50–1.35)
Sodium: 140 mEq/L (ref 135–145)
Total Protein: 7.3 g/dL (ref 6.0–8.3)

## 2013-05-17 LAB — URINALYSIS W MICROSCOPIC + REFLEX CULTURE
Bilirubin Urine: NEGATIVE
Nitrite: NEGATIVE
Specific Gravity, Urine: 1.006 (ref 1.005–1.030)
Urobilinogen, UA: 0.2 mg/dL (ref 0.0–1.0)
pH: 6.5 (ref 5.0–8.0)

## 2013-05-17 LAB — CBC WITH DIFFERENTIAL/PLATELET
Basophils Absolute: 0.1 10*3/uL (ref 0.0–0.1)
Basophils Relative: 1 % (ref 0–1)
Eosinophils Relative: 2 % (ref 0–5)
HCT: 43.6 % (ref 39.0–52.0)
Lymphocytes Relative: 20 % (ref 12–46)
MCHC: 34.9 g/dL (ref 30.0–36.0)
MCV: 85 fL (ref 78.0–100.0)
Monocytes Absolute: 0.6 10*3/uL (ref 0.1–1.0)
Neutro Abs: 4.3 10*3/uL (ref 1.7–7.7)
Platelets: 175 10*3/uL (ref 150–400)
RDW: 12.2 % (ref 11.5–15.5)
WBC: 6.4 10*3/uL (ref 4.0–10.5)

## 2013-05-17 NOTE — ED Notes (Signed)
He c/o painless hematuria--began this morning.  He states he has had TURP in 2012 by Dr. Laverle Patter.  He denies fever, nor any other sign of recent illness.

## 2013-05-17 NOTE — ED Notes (Addendum)
Patient arrived via POV. Patient states he is having bloody urine, which he describes the blood as fresh. Patient states his stool are dark since Tuesday of this week. Patient denies NV, diarrhea or stomach pain. Patient denies any changes in his diet. Patient states he is up to date on colonoscopy.

## 2013-05-17 NOTE — ED Provider Notes (Signed)
CSN: [id number]     Arrival date & time 05/17/13  1412 History   First MD Initiated Contact with Patient 05/17/13 1509     Chief Complaint  Patient presents with  . Hematuria   (Consider location/radiation/quality/duration/timing/severity/associated sxs/prior Treatment) The history is provided by the patient. No language interpreter was used.  Enmanuel Zufall is a 57 y/o M with PMHx of stress incontinence, prostate cancer with a TURP performed in 2012 by Dr. Laverle Patter, HTN, HL, DM, acute pancreatitis, migraines presenting to the ED with hematuria that started this morning. Patient reported that he has had at least 5-7 episodes of urination with red-tinged urine noted, patient cannot recall whether or not he has seen clots. Patient stated that he has mild burning sensation with each void. Stated that he has been experiencing black, tarry stools since Tuesday, reported that this occurs with each defecation the patient has. Patient reported that he was feeling tired approximately 2 days ago, but stated that he feels fine. Denied pelvic pain, abdominal pain, nausea, vomiting, diarrhea, hematochezia, back pain, neck pain, fever, dizziness, numbness, tingling.  PCP Dr. Duaine Dredge Urology: Dr. Illa Level   Past Medical History  Diagnosis Date  . Stress incontinence, male   . Prostate cancer 01/09/11    biopsy-gleason 4+4=8  . Hx of migraines   . Hypertension   . Hyperlipidemia   . Diabetes mellitus without complication   . AP (acute pancreatitis)     due to gallstones, history  . ED (erectile dysfunction)   . GERD (gastroesophageal reflux disease)   . Cataract     no surgery, just forming  . Radiation 07/03/2012-08/21/2012    prostate 6600 cGy 33 sessions   Past Surgical History  Procedure Laterality Date  . Prostate surgery  03/02/11    gleason 4+4=8  . Cholecystectomy, laparoscopic    . Inguinal hernia repair      bi-lateral  . Cholecystectomy  1990   Family History  Problem Relation Age of  Onset  . Stroke Father   . Cancer Maternal Grandmother     colon cancer   History  Substance Use Topics  . Smoking status: Former Smoker -- 1.00 packs/day for 5 years    Types: Cigarettes    Quit date: 01/31/1983  . Smokeless tobacco: Never Used  . Alcohol Use: No    Review of Systems  Constitutional: Negative for fever and chills.  Eyes: Negative for visual disturbance.  Gastrointestinal: Positive for blood in stool (black stools). Negative for nausea, vomiting, abdominal pain and diarrhea.  Genitourinary: Positive for dysuria and hematuria. Negative for flank pain.  Musculoskeletal: Negative for back pain and neck pain.  Neurological: Positive for weakness. Negative for dizziness.  All other systems reviewed and are negative.    Allergies  Review of patient's allergies indicates no known allergies.  Home Medications   Current Outpatient Rx  Name  Route  Sig  Dispense  Refill  . aspirin EC 81 MG tablet   Oral   Take 81 mg by mouth every morning.         . calcium citrate-vitamin D (CITRACAL+D) 315-200 MG-UNIT per tablet   Oral   Take 1 tablet by mouth 2 (two) times daily.         . cetirizine (ZYRTEC) 10 MG tablet   Oral   Take 10 mg by mouth daily as needed.          Marland Kitchen glipiZIDE (GLUCOTROL XL) 5 MG 24 hr tablet   Oral  Take 5 mg by mouth 2 (two) times daily.         Marland Kitchen JANUVIA 50 MG tablet   Oral   Take 50 mg by mouth daily.          Marland Kitchen lisinopril (PRINIVIL,ZESTRIL) 20 MG tablet   Oral   Take 20 mg by mouth daily.         . metFORMIN (GLUCOPHAGE) 500 MG tablet   Oral   Take 500 mg by mouth 4 (four) times daily.          . Multiple Vitamin (MULTIVITAMIN WITH MINERALS) TABS   Oral   Take 1 tablet by mouth daily.         Marland Kitchen omeprazole (PRILOSEC) 20 MG capsule   Oral   Take 20 mg by mouth daily.         . pravastatin (PRAVACHOL) 40 MG tablet   Oral   Take 40 mg by mouth daily.         . solifenacin (VESICARE) 5 MG tablet    Oral   Take 10 mg by mouth as directed.         . SUMAtriptan (IMITREX) 100 MG tablet   Oral   Take 100 mg by mouth every 2 (two) hours as needed.          BP 142/86  Pulse 101  Temp(Src) 98.2 F (36.8 C) (Oral)  Resp 18  SpO2 94% Physical Exam  Nursing note and vitals reviewed. Constitutional: He is oriented to person, place, and time. He appears well-developed and well-nourished. No distress.  HENT:  Head: Normocephalic and atraumatic.  Mouth/Throat: Oropharynx is clear and moist. No oropharyngeal exudate.  Eyes: Conjunctivae and EOM are normal. Pupils are equal, round, and reactive to light. Right eye exhibits no discharge. Left eye exhibits no discharge.  Neck: Normal range of motion. Neck supple.  Cardiovascular: Normal rate, regular rhythm and normal heart sounds.  Exam reveals no friction rub.   No murmur heard. Pulses:      Radial pulses are 2+ on the right side, and 2+ on the left side.  Pulmonary/Chest: Effort normal and breath sounds normal. No respiratory distress. He has no wheezes. He has no rales.  Abdominal: Soft. Bowel sounds are normal. There is no tenderness. There is no guarding.  Abdomen hard upon palpation   Genitourinary: Rectum normal.  Rectal Exam: negative swelling, erythema, inflammation, lesions, sores, hemorrhoids noted to the anus. Negative fissures noted. Negative masses palpated in the rectum. Sphincter tone strong. Negative bright red blood on glove. Stool brown on glove.   Musculoskeletal: Normal range of motion.  Neurological: He is alert and oriented to person, place, and time. He exhibits normal muscle tone. Coordination normal.  Skin: Skin is warm and dry. No rash noted. He is not diaphoretic. No erythema.  Psychiatric: He has a normal mood and affect. His behavior is normal. Thought content normal.    ED Course  Procedures (including critical care time)  This provider reviewed the patient's chart. Patient has been treated for prostate  cancer with a TURP in 2012 performed by Dr. Laverle Patter with radiation therapy that ended in 08/2012 for recurrent prostate cancer stage T3a.   4:13 PM Discussed history, case, presentation and labs with Dr. Silverio Lay. Dr. Silverio Lay to see patient and assess.   4:22 PM Dr. Cheri Rous recommended Urology to be consulted, patient has good outpatient follow-up. Consult for Urology made.   4:38 PM This provider spoke with Dr. Mena Goes,  Urology - discussed patient's history, case, presentation, labs. As per physician, reported that this is most likely from the radiation and effects of the recurrent cancer. Dr. Mena Goes did not recommend a scan at this time, reported that this can be done as an out patient. As per physician, recommended that patient can be discharge and to follow-up with Dr. Laverle Patter and for a cystoscopy to be performed as an outpatient.   Results for orders placed during the hospital encounter of 05/17/13  URINALYSIS W MICROSCOPIC + REFLEX CULTURE      Result Value Range   Color, Urine RED (*) YELLOW   APPearance CLOUDY (*) CLEAR   Specific Gravity, Urine 1.006  1.005 - 1.030   pH 6.5  5.0 - 8.0   Glucose, UA NEGATIVE  NEGATIVE mg/dL   Hgb urine dipstick LARGE (*) NEGATIVE   Bilirubin Urine NEGATIVE  NEGATIVE   Ketones, ur NEGATIVE  NEGATIVE mg/dL   Protein, ur 161 (*) NEGATIVE mg/dL   Urobilinogen, UA 0.2  0.0 - 1.0 mg/dL   Nitrite NEGATIVE  NEGATIVE   Leukocytes, UA SMALL (*) NEGATIVE   WBC, UA 0-2  <3 WBC/hpf   RBC / HPF TOO NUMEROUS TO COUNT  <3 RBC/hpf  CBC WITH DIFFERENTIAL      Result Value Range   WBC 6.4  4.0 - 10.5 K/uL   RBC 5.13  4.22 - 5.81 MIL/uL   Hemoglobin 15.2  13.0 - 17.0 g/dL   HCT 09.6  04.5 - 40.9 %   MCV 85.0  78.0 - 100.0 fL   MCH 29.6  26.0 - 34.0 pg   MCHC 34.9  30.0 - 36.0 g/dL   RDW 81.1  91.4 - 78.2 %   Platelets 175  150 - 400 K/uL   Neutrophils Relative % 67  43 - 77 %   Neutro Abs 4.3  1.7 - 7.7 K/uL   Lymphocytes Relative 20  12 - 46 %   Lymphs Abs 1.3  0.7  - 4.0 K/uL   Monocytes Relative 9  3 - 12 %   Monocytes Absolute 0.6  0.1 - 1.0 K/uL   Eosinophils Relative 2  0 - 5 %   Eosinophils Absolute 0.2  0.0 - 0.7 K/uL   Basophils Relative 1  0 - 1 %   Basophils Absolute 0.1  0.0 - 0.1 K/uL  COMPREHENSIVE METABOLIC PANEL      Result Value Range   Sodium 140  135 - 145 mEq/L   Potassium 4.2  3.5 - 5.1 mEq/L   Chloride 104  96 - 112 mEq/L   CO2 23  19 - 32 mEq/L   Glucose, Bld 128 (*) 70 - 99 mg/dL   BUN 13  6 - 23 mg/dL   Creatinine, Ser 9.56  0.50 - 1.35 mg/dL   Calcium 9.8  8.4 - 21.3 mg/dL   Total Protein 7.3  6.0 - 8.3 g/dL   Albumin 4.2  3.5 - 5.2 g/dL   AST 29  0 - 37 U/L   ALT 32  0 - 53 U/L   Alkaline Phosphatase 61  39 - 117 U/L   Total Bilirubin 0.2 (*) 0.3 - 1.2 mg/dL   GFR calc non Af Amer 61 (*) >90 mL/min   GFR calc Af Amer 70 (*) >90 mL/min  OCCULT BLOOD, POC DEVICE      Result Value Range   Fecal Occult Bld NEGATIVE  NEGATIVE     Labs Review Labs Reviewed  URINALYSIS W MICROSCOPIC + REFLEX CULTURE - Abnormal; Notable for the following:    Color, Urine RED (*)    APPearance CLOUDY (*)    Hgb urine dipstick LARGE (*)    Protein, ur 100 (*)    Leukocytes, UA SMALL (*)    All other components within normal limits  COMPREHENSIVE METABOLIC PANEL - Abnormal; Notable for the following:    Glucose, Bld 128 (*)    Total Bilirubin 0.2 (*)    GFR calc non Af Amer 61 (*)    GFR calc Af Amer 70 (*)    All other components within normal limits  CBC WITH DIFFERENTIAL  OCCULT BLOOD, POC DEVICE   Imaging Review No results found.  EKG Interpretation   None       MDM   1. Hematuria     Filed Vitals:   05/17/13 1432 05/17/13 1642  BP: 134/94 142/86  Pulse: 118 101  Temp: 98.5 F (36.9 C) 98.2 F (36.8 C)  TempSrc: Oral Oral  Resp: 16 18  SpO2: 92% 94%    Patient presenting to the ED with hematuria that started this morning and has been occurring with each void in association with mild dysuria. Patient  reported that he has been having black tarry stools since Tuesday. Alert and oriented GCS 15. Heart rate and rhythm normal. Pulses palpable and strong, radial 2+ bilaterally. Lungs clear to auscultation to upper and lower lobes bilaterally. BS normoactive in all 4 quadrants, non-tender, mildly hard upon palpation. Negative pain upon palpation to the abdomen. Rectal exam unremarkable  CBC negative findings - negative elevation in WBC, negative leukocytosis noted. Hgb 15.2, Hct 43.6. CMP negative findings. Urinalysis noted large Hgb with small amount of leukocytes. Fecal occult negative findings.  This provider spoke with Dr. Mena Goes, Urology, who recommended patient to be followed-up as outpatient. Patient has close follow-up and recommended cystoscopy to be performed. Patient stable, afebrile. Patient hemodynamically stable - negative drop in Hgb and Hct. Negative signs of infection to the urine. Suspicion of hematuria could possibly be mets to bladder (?) - highly recommended cystoscopy to be performed. Discussed with patient to rest and stay hydrated. Referred to PCP and urology. Discussed with patient to closely monitor symptoms and if symptoms are to worsen or change to report back to the ED - strict return instructions given.  Patient agreed to plan of care, understood, all questions answered.    Raymon Mutton, PA-C 05/18/13 1454

## 2013-05-18 NOTE — ED Provider Notes (Signed)
Medical screening examination/treatment/procedure(s) were conducted as a shared visit with non-physician practitioner(s) and myself.  I personally evaluated the patient during the encounter.  EKG Interpretation   None       Douglas Zhang is a 57 y.o. male hx of prostatectomy for prostate Ca, radiation here with blood in urine today. Able to urinate, no clots in urine. Nontender abdomen. CBC stable. No evidence of UTI. He has good f/u so workup can be done outpatient. PA called Dr. Mena Goes, who recommend f/u in clinic. I told him to return if he has symptoms of urinary retention.    Richardean Canal, MD 05/18/13 [phone]

## 2013-06-06 ENCOUNTER — Other Ambulatory Visit: Payer: Self-pay | Admitting: Urology

## 2013-06-10 ENCOUNTER — Other Ambulatory Visit (HOSPITAL_COMMUNITY): Payer: Self-pay | Admitting: Urology

## 2013-06-10 ENCOUNTER — Encounter (HOSPITAL_COMMUNITY): Payer: Self-pay | Admitting: Pharmacy Technician

## 2013-06-10 NOTE — Patient Instructions (Addendum)
[place]  06/10/2013   Your procedure is scheduled on:  06/12/13 THURSDAY  Report to [name] at  0830     AM.  Call this number if you have problems the morning of surgery: [phone]       Remember:   Do not eat food  Or drink :After Midnight. Wednesday NIGHT   Take these medicines the morning of surgery with A SIP OF WATER: OMEPRAZOLE DO NOT TAKE ANY DIABETES MEDICATION MORNING OF SURGERY  .  Contacts, dentures or partial plates can not be worn to surgery  Leave suitcase in the car. After surgery it may be brought to your room.  For patients admitted to the hospital, checkout time is 11:00 AM day of  discharge.             SPECIAL INSTRUCTIONS- SEE [place] PREPARING FOR SURGERY INSTRUCTION SHEET-     DO NOT WEAR JEWELRY, LOTIONS, POWDERS, OR PERFUMES.  WOMEN-- DO NOT SHAVE LEGS OR UNDERARMS FOR 12 HOURS BEFORE SHOWERS. MEN MAY SHAVE FACE.  Patients discharged the day of surgery will not be allowed to drive home. IF going home the day of surgery, you must have a driver and someone to stay with you for the first 24 hours  Name and phone number of your driver:    sister                                                                                                                                                      [name]  PST 25  [phone]                 FAILURE TO [place]                                                  Patient Signature _____________________________

## 2013-06-11 ENCOUNTER — Encounter (HOSPITAL_COMMUNITY): Payer: Self-pay | Admitting: *Deleted

## 2013-06-11 ENCOUNTER — Ambulatory Visit (HOSPITAL_COMMUNITY)
Admission: RE | Admit: 2013-06-11 | Discharge: 2013-06-11 | Disposition: A | Discharge status: Home / Self Care | Payer: BC Managed Care – PPO | Source: Ambulatory Visit | Attending: Urology | Admitting: Urology

## 2013-06-11 ENCOUNTER — Encounter (HOSPITAL_COMMUNITY)
Admission: RE | Admit: 2013-06-11 | Discharge: 2013-06-11 | Disposition: A | Discharge status: Home / Self Care | Payer: BC Managed Care – PPO | Source: Ambulatory Visit | Attending: Urology | Admitting: Urology

## 2013-06-11 ENCOUNTER — Encounter (HOSPITAL_COMMUNITY): Payer: Self-pay

## 2013-06-11 DIAGNOSIS — Z01812 Encounter for preprocedural laboratory examination: Secondary | ICD-10-CM

## 2013-06-11 DIAGNOSIS — Z01818 Encounter for other preprocedural examination: Secondary | ICD-10-CM | POA: Insufficient documentation

## 2013-06-11 DIAGNOSIS — R310 Gross hematuria: Secondary | ICD-10-CM | POA: Insufficient documentation

## 2013-06-11 DIAGNOSIS — Z01810 Encounter for preprocedural cardiovascular examination: Secondary | ICD-10-CM

## 2013-06-11 HISTORY — DX: Headache: R51

## 2013-06-11 LAB — CBC
HCT: 42.8 % (ref 39.0–52.0)
Hemoglobin: 14.5 g/dL (ref 13.0–17.0)
MCH: 28.8 pg (ref 26.0–34.0)
MCHC: 33.9 g/dL (ref 30.0–36.0)
MCV: 85.1 fL (ref 78.0–100.0)
PLATELETS: 169 10*3/uL (ref 150–400)
RBC: 5.03 MIL/uL (ref 4.22–5.81)
RDW: 12.1 % (ref 11.5–15.5)
WBC: 6.2 10*3/uL (ref 4.0–10.5)

## 2013-06-11 LAB — BASIC METABOLIC PANEL
BUN: 14 mg/dL (ref 6–23)
CALCIUM: 10.1 mg/dL (ref 8.4–10.5)
CO2: 23 mEq/L (ref 19–32)
CREATININE: 1.12 mg/dL (ref 0.50–1.35)
Chloride: 101 mEq/L (ref 96–112)
GFR calc non Af Amer: 71 mL/min — ABNORMAL LOW (ref >90)
GFR, EST AFRICAN AMERICAN: 82 mL/min — AB (ref >90)
Glucose, Bld: 157 mg/dL — ABNORMAL HIGH (ref 70–99)
Potassium: 4.4 mEq/L (ref 3.7–5.3)
Sodium: 139 mEq/L (ref 137–147)

## 2013-06-11 NOTE — H&P (Signed)
History of Present Illness Douglas Zhang is a 58 year old with prostate cancer s/p a UNS RAL radical prostatectomy on 03/02/11. His PSA was persistently elevated after surgery. He was initially treated with androgen deprivation which resulted in an undetectable PSA. He was treated with salvage radiation therapy from January 2014-March 2014 (66Gy) under the care of Dr. Valere Dross once his incontinence had stabilized.    Diagnosis: pT3a N0 Mx, Gleason 4+4=8 adenocarcinoma with a focal positive soft tissue margin in the area near the left seminal vesicle (25 lymph nodes negative)  Pretreatment PSA: 8.39  Pretreatment SHIM: 5    Interval history:    He follows up today for a new recent complaint of painless gross hematuria. He has had some mild dysuria but otherwise has not any significant flank pain or abdominal pain. He began noting gross hematuria in early December along with passage of multiple clots. He did not develop clot urinary retention but was concerned and presented for evaluation. He was seen in December and underwent a hematuria protocol CT scan which demonstrated multiple small left lower pole nonobstructing calculi and multiple small hypodense lesion likely renal cysts although they could not be completely characterized on his CT scan. His ureters were well visualized and there did not appear to be any cause for his significant gross hematuria on his upper tract imaging study. He follows up today for further evaluation and to undergo cystoscopy for lower tract evaluation.   Past Medical History Problems  1. History of Acute Pancreatitis Due To Gallstones (577.0) 2. Former smoker (V15.82) 3. History of diabetes mellitus (V12.29) 4. History of hyperlipidemia (V12.29) 5. History of hypertension (V12.59) 6. History of migraine headaches (V12.49)  Surgical History Problems  1. History of Cholecystectomy Laparoscopic 2. History of Inguinal Hernia Repair 3. History of Prostatectomy  Robotic-Assisted  Current Meds 1. Aspirin 81 MG Oral Tablet;  Therapy: (Recorded:13Jul2012) to Recorded 2. GlipiZIDE ER 5 MG Oral Tablet Extended Release 24 Hour;  Therapy: [id number] to Recorded 3. Januvia 50 MG Oral Tablet;  Therapy: [id number] to Recorded 4. Lisinopril 20 MG Oral Tablet;  Therapy: (Recorded:13Jul2012) to Recorded 5. MetFORMIN HCl - 500 MG Oral Tablet;  Therapy: (Recorded:13Jul2012) to Recorded 6. Multi Vitamin Mens TABS;  Therapy: (Recorded:18Dec2013) to Recorded 7. Myrbetriq 50 MG Oral Tablet Extended Release 24 Hour; Take 1 tablet daily;  Therapy: [id number] to (Evaluate:09Oct2015)  Requested for: 14Oct2014; Last  Rx:14Oct2014 Ordered 8. Omeprazole 20 MG Oral Capsule Delayed Release;  Therapy: 30Oct2013 to Recorded 9. Pravastatin Sodium 40 MG Oral Tablet;  Therapy: 23Jul2013 to Recorded 10. SUMAtriptan Succinate 100 MG Oral Tablet;   Therapy: (Recorded:13Jul2012) to Recorded 11. VESIcare 10 MG Oral Tablet; Take 1 tablet daily;   Therapy: [id number] to (Evaluate:19Sep2015)  Requested for: 24Sep2014; Last   Rx:24Sep2014 Ordered  Allergies Medication  1. No Known Drug Allergies  Family History Problems  1. Family history of Alzheimer's Disease : Mother 2. Family history of Asthma (V17.5) : Sister 3. Family history of Diabetes Mellitus (V18.0) : Sister 4. Family history of Hypertension (V17.49) : Sister 5. Family history of Migraine Headache : Sister 6. Family history of Stroke Syndrome (V17.1) : Father  Social History Problems  1. Alcohol Use   drinks socially, maybe once a month 2. Exercise Habits   no regular exercise program.  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 3. Former smoker (V15.82) 4. Occupation:   long Chief Operating Officer  Vitals Vital Signs [Data Includes: Last 1 Day]  Recorded: 02Jan2015 08:04AM  Heart Rate: 90 Recorded: 02Jan2015 08:03AM  Height: 5 ft 9.5 in Weight: 200 lb   BMI Calculated: 29.11 BSA Calculated: 2.08 Blood Pressure: 135 / 90 Temperature: 97.1 F  Physical Exam Constitutional: Well nourished and well developed . No acute distress.  ENT:. The ears and nose are normal in appearance.  Neck: The appearance of the neck is normal and no neck mass is present.  Pulmonary: No respiratory distress and normal respiratory rhythm and effort.  Cardiovascular: Heart rate and rhythm are normal . No peripheral edema.  Abdomen: The abdomen is soft and nontender. No masses are palpated. No CVA tenderness. No hernias are palpable. No hepatosplenomegaly noted.  Genitourinary: Examination of the penis demonstrates no lesions and a normal meatus.  Lymphatics: The supraclavicular, femoral and inguinal nodes are not enlarged or tender.  Skin: Normal skin turgor, no visible rash and no visible skin lesions.  Neuro/Psych:. Mood and affect are appropriate.    Results/Data Urine [Data Includes: Last 1 Day]   [id number]  COLOR YELLOW   APPEARANCE CLEAR   SPECIFIC GRAVITY 1.025   pH 5.5   GLUCOSE NEG mg/dL  BILIRUBIN NEG   KETONE NEG mg/dL  BLOOD LARGE   PROTEIN NEG mg/dL  UROBILINOGEN 0.2 mg/dL  NITRITE NEG   LEUKOCYTE ESTERASE NEG   SQUAMOUS EPITHELIAL/HPF NONE SEEN   WBC 0-2 WBC/hpf  RBC 11-20 RBC/hpf  BACTERIA NONE SEEN   CRYSTALS NONE SEEN   CASTS NONE SEEN   Selected Results  CBC W/DIFF [id number] 02:53PM Douglas Zhang  SPECIMEN TYPE: BLOOD   Test Name Result Flag Reference  WBC 4.9 K/uL  4.0-10.5  RBC 4.66 MIL/uL  4.22-5.81  HEMOGLOBIN 13.4 g/dL  13.0-17.0  HEMATOCRIT 39.7 %  39.0-52.0  MCV 85.2 fL  78.0-100.0  MCH 28.8 pg  26.0-34.0  MCHC 33.8 g/dL  30.0-36.0  RDW 13.4 %  11.5-15.5  PLATELET COUNT 153 K/uL  150-400  GRANULOCYTE % 69 %  43-77  LYMPH % 18 %  12-46  MONO % 9 %  3-12  EOS % 2 %  0-5  BASO % 2 % H 0-1  ABSOLUTE GRAN 3.4 K/uL  1.7-7.7  ABSOLUTE LYMPH 0.9 K/uL  0.7-4.0  ABSOLUTE MONO 0.5 K/uL  0.1-1.0  ABSOLUTE EOS 0.1 K/uL   0.0-0.7  ABSOLUTE BASO 0.1 K/uL  0.0-0.1  SMEAR REVIEW     Criteria for review not met   AU CT-HEMATURIA PROTOCOL [id number] 12:00AM Douglas Zhang   Test Name Result Flag Reference  AU CT-HEMATURIA PROTOCOL (Report)    ** RADIOLOGY REPORT BY [facility] **   CLINICAL DATA: Hematuria. History prostate cancer status post prostatectomy and radiation therapy.  EXAM: CT ABDOMEN AND PELVIS WITHOUT AND WITH CONTRAST  TECHNIQUE: Multidetector CT imaging of the abdomen and pelvis was performed without contrast material in one or both body regions, followed by contrast material(s) and further sections in one or both body regions.  CONTRAST: 125 mm of Isovue 300.  COMPARISON: No priors.  FINDINGS: Lung Bases: Unremarkable.  Abdomen/Pelvis: Two tiny nonobstructive calculi are present in the lower pole collecting system of the left kidney, largest of which measures only 3 mm. No additional calculi are identified within the right renal collecting system, along the course of either ureter, or within the lumen of the urinary bladder. No hydroureteronephrosis to suggest urinary tract obstruction at this time. Multiple low-attenuation lesions are noted in the kidneys bilaterally, many  of which are too small to definitively characterize. The largest of these low-attenuation lesions measures up to 3.8 cm in diameter extending exophytically the off the upper pole of the right kidney, compatible with a simple cyst. No definite aggressive appearing renal lesions are identified. Postcontrast delayed images demonstrate no definite filling defect within the collecting system of either kidney, along the course of either ureter, or within the lumen of the urinary bladder to strongly suggest the presence of a urothelial neoplasm at this time.  Status post cholecystectomy. The appearance of the liver, spleen and bilateral adrenal glands is unremarkable. Two subcentimeter  low attenuation lesions (fatty attenuation) in the body and tail of the pancreas are compatible with small pancreatic lipomas. In addition, there is a 1.8 cm fatty attenuation lesion in the 3rd portion of the duodenum, compatible with a duodenal lipoma.  No significant volume of ascites. No pneumoperitoneum. No pathologic distention of small bowel. No definite lymphadenopathy identified within the abdomen or pelvis. The appendix appears dilated, measuring up to 12 mm in diameter, however, there are no periappendiceal inflammatory changes, and the entire appendiceal lumen is filled with gas. Postoperative changes of radical prostatectomy are noted. Atherosclerosis throughout the abdominal and pelvic vasculature, without evidence of aneurysm or dissection.  Musculoskeletal: There are no aggressive appearing lytic or blastic lesions noted in the visualized portions of the skeleton. Superior endplate compression fracture of L3 with approximately 30% loss of central height.  IMPRESSION: 1. Two tiny nonobstructive calculi in the lower pole collecting system of the left kidney, largest of which measures only 3 mm. No ureteral stones or findings of urinary tract obstruction at this time. 2. Multiple low-attenuation renal lesions bilaterally, none of which appear suspicious, but several of which are too small to definitively characterize. These are all favored to represent cysts, but would require MRI with and without IV gadolinium for definitive characterization if clinically indicated. 3. 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 These lesions are benign. 4. There is also an incidental 1.8 cm lipoma in the 3rd portion of the duodenum. 5. Status post cholecystectomy. 6. Additional incidental findings, as above.   Electronically Signed  By: Vinnie Langton M.D.  On: 05/19/2013 16:57    I independently reviewed his CT scan from 05/19/13.  Findings are as outlined above in the history of present illness.  Procedure  Procedure: Cystoscopy  Chaperone Present: Uvaldo Bristle.  Indication: Hematuria.  Informed Consent: Risks, benefits, and potential adverse events were discussed and informed consent was obtained from the patient.  Prep: The patient was prepped with betadine.  Anesthesia:. Local anesthesia was administered intraurethrally with 2% lidocaine jelly.  Procedure Note:  Urethral meatus:. No abnormalities.  Anterior urethra: No abnormalities.  Prostatic urethra:. Surgically absent.  Bladder: Visulization was clear. The ureteral orifices were in the normal anatomic position bilaterally and had clear efflux of urine. Systematic examination of the bladder revealed significant hypertrophic changes of the left lateral bladder extending from the bladder neck back posteriorly and laterally. There were no definite papillary bladder tumors noted. This area measured approximately 5 cm in largest diameter and is potentially consistent with radiation changes although also could possibly represent an invasive bladder tumor. There were areas of hemorrhage clearly identifying this as the source of his bleeding symptoms. No other abnormalities were identified. The patient tolerated the procedure well.  Complications: None.    Plan  Health Maintenance  1. UA With REFLEX; [Do Not Release]; Status:Complete;  Done: [id number] 07:54AM Prostate cancer  2. PSA; Status:Hold For - Specimen/Data Collection,Appointment; Requested  YTK:[id number];  3. PSA; Status:In Progress - Specimen/Data Collected;   Done: 75TZG0174 4. VENIPUNCTURE; Status:Complete;   Done: 94WHQ7591 5. Follow-up Keep Future Appt Office  Follow-up  - PSA drawn today so can cancel lab appt  on 1/20 but need to keep [place] on 1/28  Status: Hold For - Appointment  Requested for:  02Jan2015 6. Follow-up Schedule Surgery Office  Follow-up  Status: Hold For - Appointment   Requested for:  [id number]  Prostate cancer (185) (C61)   Discussion/Summary 1. Gross hematuria: The source of his bleeding appears to be identified on cystoscopy today and either represents radiation changes to the bladder versus an invasive bladder tumor. Considering his age, history of radiation therapy, and remote history of smoking, I explained the need to rule out a bladder malignancy. I have recommended that he proceed with cystoscopy under anesthesia, pelvic exam under anesthesia, bladder washing for urine cytology, and bladder biopsies for diagnostic purposes. We have reviewed the potential risks and complications as well as the expected recovery process associated with this procedure. Specifically, we discussed the potential for increased risk of bleeding considering his prior radiation therapy.    2. Urolithiasis: He has nonobstructing left renal calculi which do not require intervention or treatment at this time.    3. Bilateral hypodense renal lesions: Although these statistically likely represent simple renal cysts, they are not completely characterized on CT imaging and he will require further imaging with MRI in the future. I will likely delay this for 6-8 months to assess for any interval size changes of these lesions. This also will help to definitively characterize these lesions.    4. Prostate cancer: His PSA will be checked today.    5. Incontinence: He will continue Vesicare 10 mg.    Cc: Dr. Arloa Koh  Dr. Derinda Late     Signatures Electronically signed by : Raynelle Bring, M.D.; Jun 06 2013  9:00AM EST

## 2013-06-11 NOTE — Progress Notes (Signed)
06/11/13 0819  OBSTRUCTIVE SLEEP APNEA  Have you ever been diagnosed with sleep apnea through a sleep study? No  Do you snore loudly (loud enough to be heard through closed doors)?  0  Do you often feel tired, fatigued, or sleepy during the daytime? 1  Has anyone observed you stop breathing during your sleep? 0  Do you have, or are you being treated for high blood pressure? 1  BMI more than 35 kg/m2? 0  Age over 59 years old? 1  Neck circumference greater than 40 cm/18 inches? 0  Gender: 1  Obstructive Sleep Apnea Score 4  Score 4 or greater  Results sent to PCP

## 2013-06-12 ENCOUNTER — Encounter (HOSPITAL_COMMUNITY)
Admission: RE | Disposition: A | Discharge status: Home / Self Care | Payer: Self-pay | Source: Ambulatory Visit | Attending: Urology

## 2013-06-12 ENCOUNTER — Ambulatory Visit (HOSPITAL_COMMUNITY): Payer: BC Managed Care – PPO | Admitting: Anesthesiology

## 2013-06-12 ENCOUNTER — Encounter (HOSPITAL_COMMUNITY): Payer: BC Managed Care – PPO | Admitting: Anesthesiology

## 2013-06-12 ENCOUNTER — Encounter (HOSPITAL_COMMUNITY): Payer: Self-pay | Admitting: *Deleted

## 2013-06-12 ENCOUNTER — Ambulatory Visit (HOSPITAL_COMMUNITY)
Admission: RE | Admit: 2013-06-12 | Discharge: 2013-06-12 | Disposition: A | Discharge status: Home / Self Care | Payer: BC Managed Care – PPO | Source: Ambulatory Visit | Attending: Urology | Admitting: Urology

## 2013-06-12 DIAGNOSIS — Z01812 Encounter for preprocedural laboratory examination: Secondary | ICD-10-CM | POA: Insufficient documentation

## 2013-06-12 DIAGNOSIS — R310 Gross hematuria: Secondary | ICD-10-CM | POA: Insufficient documentation

## 2013-06-12 DIAGNOSIS — Z9089 Acquired absence of other organs: Secondary | ICD-10-CM | POA: Insufficient documentation

## 2013-06-12 DIAGNOSIS — Z79899 Other long term (current) drug therapy: Secondary | ICD-10-CM | POA: Insufficient documentation

## 2013-06-12 DIAGNOSIS — N3289 Other specified disorders of bladder: Secondary | ICD-10-CM | POA: Insufficient documentation

## 2013-06-12 DIAGNOSIS — N289 Disorder of kidney and ureter, unspecified: Secondary | ICD-10-CM | POA: Insufficient documentation

## 2013-06-12 DIAGNOSIS — Z01810 Encounter for preprocedural cardiovascular examination: Secondary | ICD-10-CM | POA: Insufficient documentation

## 2013-06-12 DIAGNOSIS — I10 Essential (primary) hypertension: Secondary | ICD-10-CM | POA: Insufficient documentation

## 2013-06-12 DIAGNOSIS — K219 Gastro-esophageal reflux disease without esophagitis: Secondary | ICD-10-CM | POA: Insufficient documentation

## 2013-06-12 DIAGNOSIS — R32 Unspecified urinary incontinence: Secondary | ICD-10-CM | POA: Insufficient documentation

## 2013-06-12 DIAGNOSIS — Z7982 Long term (current) use of aspirin: Secondary | ICD-10-CM | POA: Insufficient documentation

## 2013-06-12 DIAGNOSIS — N200 Calculus of kidney: Secondary | ICD-10-CM | POA: Insufficient documentation

## 2013-06-12 DIAGNOSIS — C61 Malignant neoplasm of prostate: Secondary | ICD-10-CM | POA: Insufficient documentation

## 2013-06-12 DIAGNOSIS — N3000 Acute cystitis without hematuria: Secondary | ICD-10-CM | POA: Insufficient documentation

## 2013-06-12 DIAGNOSIS — Z87891 Personal history of nicotine dependence: Secondary | ICD-10-CM | POA: Insufficient documentation

## 2013-06-12 DIAGNOSIS — Z01818 Encounter for other preprocedural examination: Secondary | ICD-10-CM | POA: Insufficient documentation

## 2013-06-12 DIAGNOSIS — Z9079 Acquired absence of other genital organ(s): Secondary | ICD-10-CM | POA: Insufficient documentation

## 2013-06-12 DIAGNOSIS — E785 Hyperlipidemia, unspecified: Secondary | ICD-10-CM | POA: Insufficient documentation

## 2013-06-12 DIAGNOSIS — E119 Type 2 diabetes mellitus without complications: Secondary | ICD-10-CM | POA: Insufficient documentation

## 2013-06-12 HISTORY — PX: CYSTOSCOPY WITH BIOPSY: SHX5122

## 2013-06-12 LAB — GLUCOSE, CAPILLARY: Glucose-Capillary: 171 mg/dL — ABNORMAL HIGH (ref 70–99)

## 2013-06-12 SURGERY — CYSTOSCOPY, WITH BIOPSY
Anesthesia: General

## 2013-06-12 MED ORDER — PROMETHAZINE HCL 25 MG/ML IJ SOLN: 6.2500 mg | INTRAMUSCULAR | Status: DC | PRN

## 2013-06-12 MED ORDER — 0.9 % SODIUM CHLORIDE (POUR BTL) OPTIME
TOPICAL | Status: DC | PRN
  Administered 2013-06-12: 1000 mL

## 2013-06-12 MED ORDER — OXYCODONE HCL 5 MG PO TABS: 5.0000 mg | ORAL_TABLET | Freq: Once | ORAL | Status: DC | PRN

## 2013-06-12 MED ORDER — ONDANSETRON HCL 4 MG/2ML IJ SOLN
INTRAMUSCULAR | Status: DC | PRN
  Administered 2013-06-12: 4 mg via INTRAVENOUS

## 2013-06-12 MED ORDER — LIDOCAINE HCL (CARDIAC) 20 MG/ML IV SOLN
INTRAVENOUS | Status: DC | PRN
  Administered 2013-06-12: 50 mg via INTRAVENOUS

## 2013-06-12 MED ORDER — LACTATED RINGERS IV SOLN
INTRAVENOUS | Status: DC
  Administered 2013-06-12: 1000 mL via INTRAVENOUS

## 2013-06-12 MED ORDER — HYDROCODONE-ACETAMINOPHEN 5-325 MG PO TABS: 1.0000 | ORAL_TABLET | Freq: Four times a day (QID) | ORAL | Status: DC | PRN

## 2013-06-12 MED ORDER — HYDROMORPHONE HCL PF 1 MG/ML IJ SOLN: 0.2500 mg | INTRAMUSCULAR | Status: DC | PRN

## 2013-06-12 MED ORDER — MIDAZOLAM HCL 2 MG/2ML IJ SOLN
INTRAMUSCULAR | Status: AC
  Filled 2013-06-12: qty 2

## 2013-06-12 MED ORDER — LIDOCAINE HCL (CARDIAC) 20 MG/ML IV SOLN
INTRAVENOUS | Status: AC
  Filled 2013-06-12: qty 5

## 2013-06-12 MED ORDER — STERILE WATER FOR IRRIGATION IR SOLN
Status: DC | PRN
  Administered 2013-06-12: 3000 mL

## 2013-06-12 MED ORDER — CIPROFLOXACIN IN D5W 400 MG/200ML IV SOLN
INTRAVENOUS | Status: AC
  Filled 2013-06-12: qty 200

## 2013-06-12 MED ORDER — MIDAZOLAM HCL 5 MG/5ML IJ SOLN
INTRAMUSCULAR | Status: DC | PRN
  Administered 2013-06-12: 2 mg via INTRAVENOUS

## 2013-06-12 MED ORDER — FENTANYL CITRATE 0.05 MG/ML IJ SOLN
INTRAMUSCULAR | Status: AC
  Filled 2013-06-12: qty 2

## 2013-06-12 MED ORDER — FENTANYL CITRATE 0.05 MG/ML IJ SOLN
INTRAMUSCULAR | Status: DC | PRN
  Administered 2013-06-12 (×2): 50 ug via INTRAVENOUS

## 2013-06-12 MED ORDER — PHENAZOPYRIDINE HCL 100 MG PO TABS
100.0000 mg | ORAL_TABLET | Freq: Once | ORAL | Status: AC
  Administered 2013-06-12: 100 mg via ORAL
  Filled 2013-06-12: qty 1

## 2013-06-12 MED ORDER — PHENAZOPYRIDINE HCL 100 MG PO TABS: 100.0000 mg | ORAL_TABLET | Freq: Three times a day (TID) | ORAL | Status: DC | PRN

## 2013-06-12 MED ORDER — CIPROFLOXACIN IN D5W 400 MG/200ML IV SOLN
400.0000 mg | INTRAVENOUS | Status: AC
  Administered 2013-06-12: 400 mg via INTRAVENOUS

## 2013-06-12 MED ORDER — PROPOFOL 10 MG/ML IV BOLUS
INTRAVENOUS | Status: DC | PRN
Start: 2013-06-12 — End: 2013-06-12
  Administered 2013-06-12: 160 mg via INTRAVENOUS

## 2013-06-12 MED ORDER — MEPERIDINE HCL 50 MG/ML IJ SOLN: 6.2500 mg | INTRAMUSCULAR | Status: DC | PRN

## 2013-06-12 MED ORDER — PROPOFOL 10 MG/ML IV BOLUS
INTRAVENOUS | Status: AC
  Filled 2013-06-12: qty 20

## 2013-06-12 MED ORDER — CEFAZOLIN SODIUM-DEXTROSE 2-3 GM-% IV SOLR
INTRAVENOUS | Status: AC
  Filled 2013-06-12: qty 50

## 2013-06-12 MED ORDER — OXYCODONE HCL 5 MG/5ML PO SOLN
5.0000 mg | Freq: Once | ORAL | Status: DC | PRN
  Filled 2013-06-12: qty 5

## 2013-06-12 SURGICAL SUPPLY — 18 items
BAG URINE DRAINAGE (UROLOGICAL SUPPLIES) IMPLANT
BAG URO CATCHER STRL LF (DRAPE) ×3 IMPLANT
DRAPE CAMERA CLOSED 9X96 (DRAPES) ×3 IMPLANT
ELECT REM PT RETURN 9FT ADLT (ELECTROSURGICAL) ×3
ELECTRODE REM PT RTRN 9FT ADLT (ELECTROSURGICAL) ×1 IMPLANT
EVACUATOR MICROVAS BLADDER (UROLOGICAL SUPPLIES) IMPLANT
GLOVE BIOGEL M STRL SZ7.5 (GLOVE) ×3 IMPLANT
GOWN STRL REUS W/TWL LRG LVL3 (GOWN DISPOSABLE) ×9 IMPLANT
KIT ASPIRATION TUBING (SET/KITS/TRAYS/PACK) IMPLANT
LOOPS RESECTOSCOPE DISP (ELECTROSURGICAL) IMPLANT
MANIFOLD NEPTUNE II (INSTRUMENTS) ×3 IMPLANT
NDL SAFETY ECLIPSE 18X1.5 (NEEDLE) ×1 IMPLANT
NEEDLE HYPO 18GX1.5 SHARP (NEEDLE) ×2
PACK CYSTO (CUSTOM PROCEDURE TRAY) ×3 IMPLANT
SYR 50ML LL SCALE MARK (SYRINGE) ×6 IMPLANT
SYRINGE IRR TOOMEY STRL 70CC (SYRINGE) IMPLANT
TUBING CONNECTING 10 (TUBING) ×2 IMPLANT
TUBING CONNECTING 10' (TUBING) ×1

## 2013-06-12 NOTE — Anesthesia Preprocedure Evaluation (Addendum)
Anesthesia Evaluation  Patient identified by MRN, date of birth, ID band Patient awake    Reviewed: Allergy & Precautions, H&P , NPO status , Patient's Chart, lab work & pertinent test results  Airway Mallampati: II TM Distance: >3 FB Neck ROM: Full    Dental  (+) Dental Advisory Given   Pulmonary neg pulmonary ROS, former smoker,  breath sounds clear to auscultation        Cardiovascular hypertension, Pt. on medications Rhythm:Regular Rate:Normal     Neuro/Psych  Headaches, negative psych ROS   GI/Hepatic Neg liver ROS, GERD-  Medicated,  Endo/Other  diabetes, Type 2, Oral Hypoglycemic Agents  Renal/GU negative Renal ROS     Musculoskeletal negative musculoskeletal ROS (+)   Abdominal   Peds  Hematology negative hematology ROS (+)   Anesthesia Other Findings   Reproductive/Obstetrics negative OB ROS                          Anesthesia Physical Anesthesia Plan  ASA: II  Anesthesia Plan: General   Post-op Pain Management:    Induction: Intravenous  Airway Management Planned: LMA  Additional Equipment:   Intra-op Plan:   Post-operative Plan: Extubation in OR  Informed Consent: I have reviewed the patients History and Physical, chart, labs and discussed the procedure including the risks, benefits and alternatives for the proposed anesthesia with the patient or authorized representative who has indicated his/her understanding and acceptance.   Dental advisory given  Plan Discussed with: CRNA  Anesthesia Plan Comments:         Anesthesia Quick Evaluation

## 2013-06-12 NOTE — Transfer of Care (Signed)
Immediate Anesthesia Transfer of Care Note  Patient: Douglas Zhang  Procedure(s) Performed: Procedure(s) (LRB): CYSTOSCOPY WITH BIOPSY bladder washing (N/A)  Patient Location: PACU  Anesthesia Type: General  Level of Consciousness: sedated, patient cooperative and responds to stimulation  Airway & Oxygen Therapy: Patient Spontanous Breathing and Patient connected to face mask oxgen  Post-op Assessment: Report given to PACU RN and Post -op Vital signs reviewed and stable  Post vital signs: Reviewed and stable  Complications: No apparent anesthesia complications

## 2013-06-12 NOTE — Anesthesia Postprocedure Evaluation (Signed)
Anesthesia Post Note  Patient: Douglas Zhang  Procedure(s) Performed: Procedure(s) (LRB): CYSTOSCOPY WITH BIOPSY bladder washing (N/A)  Anesthesia type: General  Patient location: PACU  Post pain: Pain level controlled  Post assessment: Post-op Vital signs reviewed  Last Vitals: BP 126/82  Pulse 71  Temp(Src) 36.4 C (Oral)  Resp 16  SpO2 97%  Post vital signs: Reviewed  Level of consciousness: sedated  Complications: No apparent anesthesia complications

## 2013-06-12 NOTE — Interval H&P Note (Signed)
History and Physical Interval Note:  06/12/2013 10:28 AM  Douglas Zhang  has presented today for surgery, with the diagnosis of Gross Hematuria, Possible Bladder Tumor  The various methods of treatment have been discussed with the patient and family. After consideration of risks, benefits and other options for treatment, the patient has consented to  Procedure(s) with comments: CYSTOSCOPY WITH BIOPSY (N/A) - BLADDER BIOPSY CYTOLOGY EXAM UNDER ANESTHESIA   as a surgical intervention .  The patient's history has been reviewed, patient examined, no change in status, stable for surgery.  I have reviewed the patient's chart and labs.  Questions were answered to the patient's satisfaction.     ,LES

## 2013-06-12 NOTE — Discharge Instructions (Signed)
1. You may see some blood in the urine and may have some burning with urination for 48-72 hours. You also may notice that you have to urinate more frequently or urgently after your procedure which is normal.  2. You should call should you develop an inability urinate, fever > 101, persistent nausea and vomiting that prevents you from eating or drinking to stay hydrated.  3.   You should stop your aspirin and vitamin until you see Dr. Alinda Money for your follow up visit.

## 2013-06-12 NOTE — Op Note (Signed)
Preoperative diagnosis:  1. Gross hematuria 2. Bladder lesion  Postoperative diagnosis: 1. Gross hematuria  2. Bladder lesion  Procedure(s): 1. Cystoscopy 2. Examination under anesthesia 3. Bladder biopsy (x 3) 4. Bladder washing for cytology  Surgeon: Dr. Roxy Horseman, Jr  Anesthesia: General  Complications: None   EBL: Minimal  Specimens: 1. Urine cytology 2. Bladder biopsy of left lateral lesion  Disposition of specimens: Pathology  Indication: Douglas Zhang is a 58 year old with a history of prostate cancer s/p radical prostatectomy and salvage radiation therapy.  He recently developed gross hematuria. Upper tract imaging was performed with a CT scan which was unremarkable.  Cystoscopy revealed an erythematous lesion measuring about 4 cm along the left lateral and posterior bladder possibly representing a bladder malignancy vs radiation changes. I discussed the potential benefits and risks of the procedure, side effects of the proposed treatment, the likelihood of the patient achieving the goals of the procedure, and any potential problems that might occur during the procedure or recuperation.   Description of procedure:  The patient was taken to the operating room and a general anesthetic was administered.  He was given preoperative antibiotics, prepped and draped in the usual sterile fashion, and placed in the dorsal lithotomy position. A preoperative time out was performed.  Cystourethroscopy was then performed which revealed a normal anterior urethra.  There appeared to be adequate coaptation of urethral sphincter.  The prostatic urethra was surgically absent.  A systematic examination of the bladder was then performed with a 12 and 70 lens which revealed a 4 cm area of edematous and erythematous tissue along the left lateral and posterior bladder wall just beyond the ureteral orifice.  These findings were consistent with either radiation change or possible invasive tumor.   No other mucosal abnormalities or tumor growths were identified on evaluation of the remainder of the bladder.  The patient had previously undergone a CT scan for upper tract imaging with good opacification of the ureters and no filling defects identified. A bladder washing was obtained for cytologic analysis.  The cold cup biopsy forceps was then used to take multiple representative samples from the abnormal mucosa within the bladder.  These areas were sent for pathologic analysis.  Bugbee electrode was then used to provide adequate hemostasis.  The bladder was emptied and then reinspected and hemostasis appeared excellent.  The cystoscope was removed and the procedure was ended.  The patient appeared to tolerate the procedure well without complications.  He was able to be awakened and transferred to the recovery in satisfactory condition.

## 2013-06-16 ENCOUNTER — Encounter (HOSPITAL_COMMUNITY): Payer: Self-pay | Admitting: Urology

## 2014-01-09 ENCOUNTER — Other Ambulatory Visit (HOSPITAL_COMMUNITY): Payer: Self-pay | Admitting: Family Medicine

## 2014-01-09 ENCOUNTER — Ambulatory Visit (HOSPITAL_COMMUNITY)
Admission: RE | Admit: 2014-01-09 | Discharge: 2014-01-09 | Disposition: A | Discharge status: Home / Self Care | Payer: BC Managed Care – PPO | Source: Ambulatory Visit | Attending: Surgery | Admitting: Surgery

## 2014-01-09 DIAGNOSIS — R0989 Other specified symptoms and signs involving the circulatory and respiratory systems: Secondary | ICD-10-CM | POA: Insufficient documentation

## 2014-09-24 ENCOUNTER — Emergency Department (HOSPITAL_COMMUNITY)
Admission: EM | Admit: 2014-09-24 | Discharge: 2014-09-25 | Disposition: A | Discharge status: Home / Self Care | Payer: Self-pay | Attending: Emergency Medicine | Admitting: Emergency Medicine

## 2014-09-24 ENCOUNTER — Encounter (HOSPITAL_COMMUNITY): Payer: Self-pay

## 2014-09-24 DIAGNOSIS — N529 Male erectile dysfunction, unspecified: Secondary | ICD-10-CM | POA: Insufficient documentation

## 2014-09-24 DIAGNOSIS — E119 Type 2 diabetes mellitus without complications: Secondary | ICD-10-CM | POA: Insufficient documentation

## 2014-09-24 DIAGNOSIS — Z923 Personal history of irradiation: Secondary | ICD-10-CM | POA: Insufficient documentation

## 2014-09-24 DIAGNOSIS — Z87891 Personal history of nicotine dependence: Secondary | ICD-10-CM | POA: Insufficient documentation

## 2014-09-24 DIAGNOSIS — R339 Retention of urine, unspecified: Secondary | ICD-10-CM

## 2014-09-24 DIAGNOSIS — Z8546 Personal history of malignant neoplasm of prostate: Secondary | ICD-10-CM | POA: Insufficient documentation

## 2014-09-24 DIAGNOSIS — R531 Weakness: Secondary | ICD-10-CM | POA: Insufficient documentation

## 2014-09-24 DIAGNOSIS — N390 Urinary tract infection, site not specified: Secondary | ICD-10-CM | POA: Insufficient documentation

## 2014-09-24 DIAGNOSIS — K219 Gastro-esophageal reflux disease without esophagitis: Secondary | ICD-10-CM | POA: Insufficient documentation

## 2014-09-24 DIAGNOSIS — E785 Hyperlipidemia, unspecified: Secondary | ICD-10-CM | POA: Insufficient documentation

## 2014-09-24 DIAGNOSIS — I10 Essential (primary) hypertension: Secondary | ICD-10-CM | POA: Insufficient documentation

## 2014-09-24 DIAGNOSIS — Z9889 Other specified postprocedural states: Secondary | ICD-10-CM | POA: Insufficient documentation

## 2014-09-24 DIAGNOSIS — Z9049 Acquired absence of other specified parts of digestive tract: Secondary | ICD-10-CM | POA: Insufficient documentation

## 2014-09-24 DIAGNOSIS — Z79899 Other long term (current) drug therapy: Secondary | ICD-10-CM | POA: Insufficient documentation

## 2014-09-24 DIAGNOSIS — H269 Unspecified cataract: Secondary | ICD-10-CM | POA: Insufficient documentation

## 2014-09-24 LAB — CBC WITH DIFFERENTIAL/PLATELET
BASOS ABS: 0.1 10*3/uL (ref 0.0–0.1)
BASOS PCT: 1 % (ref 0–1)
EOS ABS: 0.1 10*3/uL (ref 0.0–0.7)
Eosinophils Relative: 1 % (ref 0–5)
HCT: 39.8 % (ref 39.0–52.0)
HEMOGLOBIN: 13.7 g/dL (ref 13.0–17.0)
Lymphocytes Relative: 9 % — ABNORMAL LOW (ref 12–46)
Lymphs Abs: 1 10*3/uL (ref 0.7–4.0)
MCH: 29.7 pg (ref 26.0–34.0)
MCHC: 34.4 g/dL (ref 30.0–36.0)
MCV: 86.3 fL (ref 78.0–100.0)
MONOS PCT: 6 % (ref 3–12)
Monocytes Absolute: 0.7 10*3/uL (ref 0.1–1.0)
NEUTROS ABS: 9.3 10*3/uL — AB (ref 1.7–7.7)
Neutrophils Relative %: 83 % — ABNORMAL HIGH (ref 43–77)
Platelets: 168 10*3/uL (ref 150–400)
RBC: 4.61 MIL/uL (ref 4.22–5.81)
RDW: 12.2 % (ref 11.5–15.5)
WBC: 11.1 10*3/uL — ABNORMAL HIGH (ref 4.0–10.5)

## 2014-09-24 LAB — COMPREHENSIVE METABOLIC PANEL
ALT: 29 U/L (ref 0–53)
ANION GAP: 11 (ref 5–15)
AST: 28 U/L (ref 0–37)
Albumin: 4.4 g/dL (ref 3.5–5.2)
Alkaline Phosphatase: 52 U/L (ref 39–117)
BUN: 35 mg/dL — AB (ref 6–23)
CO2: 24 mmol/L (ref 19–32)
Calcium: 9.8 mg/dL (ref 8.4–10.5)
Chloride: 97 mmol/L (ref 96–112)
Creatinine, Ser: 1.56 mg/dL — ABNORMAL HIGH (ref 0.50–1.35)
GFR calc non Af Amer: 47 mL/min — ABNORMAL LOW (ref >90)
GFR, EST AFRICAN AMERICAN: 54 mL/min — AB (ref >90)
GLUCOSE: 223 mg/dL — AB (ref 70–99)
Potassium: 4.6 mmol/L (ref 3.5–5.1)
Sodium: 132 mmol/L — ABNORMAL LOW (ref 135–145)
Total Bilirubin: 0.4 mg/dL (ref 0.3–1.2)
Total Protein: 7.2 g/dL (ref 6.0–8.3)

## 2014-09-24 LAB — URINE MICROSCOPIC-ADD ON

## 2014-09-24 MED ORDER — CIPROFLOXACIN IN D5W 400 MG/200ML IV SOLN: 400.0000 mg | Freq: Once | INTRAVENOUS | Status: DC

## 2014-09-24 MED ORDER — CIPROFLOXACIN HCL 500 MG PO TABS: 500.0000 mg | ORAL_TABLET | Freq: Two times a day (BID) | ORAL | Status: DC

## 2014-09-24 MED ORDER — CIPROFLOXACIN HCL 500 MG PO TABS
500.0000 mg | ORAL_TABLET | Freq: Once | ORAL | Status: AC
Start: 2014-09-24 — End: 2014-09-25
  Administered 2014-09-25: 500 mg via ORAL
  Filled 2014-09-24: qty 1

## 2014-09-24 MED ORDER — LIDOCAINE HCL 2 % EX GEL
1.0000 "application " | Freq: Once | CUTANEOUS | Status: AC
  Administered 2014-09-24: 1 via URETHRAL
  Filled 2014-09-24: qty 10

## 2014-09-24 MED ORDER — SODIUM CHLORIDE 0.9 % IV BOLUS (SEPSIS)
500.0000 mL | Freq: Once | INTRAVENOUS | Status: AC
  Administered 2014-09-24: 500 mL via INTRAVENOUS

## 2014-09-24 MED ORDER — HYDROCODONE-ACETAMINOPHEN 5-325 MG PO TABS: 1.0000 | ORAL_TABLET | Freq: Four times a day (QID) | ORAL | Status: DC | PRN

## 2014-09-24 NOTE — ED Notes (Signed)
Nurse currently starting IV 

## 2014-09-24 NOTE — Discharge Instructions (Signed)
As discussed, it is important that you follow up as soon as possible with your physician for continued management of your condition. ° °If you develop any new, or concerning changes in your condition, please return to the emergency department immediately. ° °

## 2014-09-24 NOTE — ED Provider Notes (Signed)
CSN: [id number]     Arrival date & time 09/24/14  1951 History   First MD Initiated Contact with Patient 09/24/14 2010     Chief Complaint  Patient presents with  . Urinary Retention     HPI  Patient presents with urinary retention. About 4 hours prior to my evaluation the patient was unable to void. Subsequent, he had several episodes of minimal urine output, though he started producing blood and blood clots. Just before ED arrival the patient developed suprapubic pain as well. No upper abdominal pain, no nausea, vomiting, chest pain or other focal complaints. Patient has a history of prostatectomy 4 years ago.   Past Medical History  Diagnosis Date  . Stress incontinence, male   . Prostate cancer 01/09/11    biopsy-gleason 4+4=8  . Hx of migraines   . Hypertension   . Hyperlipidemia   . Diabetes mellitus without complication   . AP (acute pancreatitis)     due to gallstones, history  . GERD (gastroesophageal reflux disease)   . Cataract     no surgery, just forming  . Radiation 07/03/2012-08/21/2012    prostate 6600 cGy 33 sessions  . Headache(784.0)     migraines  . ED (erectile dysfunction)    Past Surgical History  Procedure Laterality Date  . Prostate surgery  03/02/11    gleason 4+4=8  . Cholecystectomy, laparoscopic    . Inguinal hernia repair      bi-lateral  . Cholecystectomy  1990  . Cystoscopy with biopsy N/A 06/12/2013    Procedure: CYSTOSCOPY WITH BIOPSY bladder washing;  Surgeon: Dutch Gray, MD;  Location: [facility];  Service: Urology;  Laterality: N/A;  BLADDER BIOPSY CYTOLOGY EXAM UNDER ANESTHESIA     Family History  Problem Relation Age of Onset  . Stroke Father   . Cancer Maternal Grandmother     colon cancer   History  Substance Use Topics  . Smoking status: Former Smoker -- 1.00 packs/day for 5 years    Types: Cigarettes    Quit date: 01/31/1983  . Smokeless tobacco: Never Used  . Alcohol Use: No    Review of Systems  Constitutional:         Per HPI, otherwise negative  HENT:       Per HPI, otherwise negative  Respiratory:       Per HPI, otherwise negative  Cardiovascular:       Per HPI, otherwise negative  Gastrointestinal: Negative for vomiting.  Endocrine:       Negative aside from HPI  Genitourinary:       Neg aside from HPI   Musculoskeletal:       Per HPI, otherwise negative  Skin: Negative.   Neurological: Positive for weakness. Negative for syncope.      Allergies  Review of patient's allergies indicates no known allergies.  Home Medications   Prior to Admission medications   Medication Sig Start Date End Date Taking? Authorizing Provider  calcium citrate-vitamin D (CITRACAL+D) 315-200 MG-UNIT per tablet Take 1 tablet by mouth 2 (two) times daily.   Yes Historical Provider, MD  cetirizine (ZYRTEC) 10 MG tablet Take 10 mg by mouth daily.    Yes Historical Provider, MD  glipiZIDE (GLUCOTROL XL) 5 MG 24 hr tablet Take 5 mg by mouth 2 (two) times daily.   Yes Historical Provider, MD  hydrochlorothiazide (HYDRODIURIL) 25 MG tablet Take 25 mg by mouth daily.  09/13/14  Yes Historical Provider, MD  JANUVIA 100 MG tablet Take  100 mg by mouth daily.  07/28/14  Yes Historical Provider, MD  lisinopril (PRINIVIL,ZESTRIL) 20 MG tablet Take 20 mg by mouth daily with breakfast.    Yes Historical Provider, MD  metFORMIN (GLUCOPHAGE-XR) 500 MG 24 hr tablet Take 1,000 mg by mouth 2 (two) times daily.  09/15/14  Yes Historical Provider, MD  MYRBETRIQ 50 MG TB24 tablet Take 50 mg by mouth daily.  08/26/14  Yes Historical Provider, MD  omeprazole (PRILOSEC) 20 MG capsule Take 20 mg by mouth daily as needed (indigestion).    Yes Historical Provider, MD  pravastatin (PRAVACHOL) 80 MG tablet Take 80 mg by mouth daily.  09/13/14  Yes Historical Provider, MD  SUMAtriptan (IMITREX) 100 MG tablet Take 100 mg by mouth every 2 (two) hours as needed for migraine (migraine).    Yes Historical Provider, MD  ciprofloxacin (CIPRO) 500 MG  tablet Take 1 tablet (500 mg total) by mouth 2 (two) times daily. 09/24/14   Carmin Muskrat, MD  HYDROcodone-acetaminophen (NORCO/VICODIN) 5-325 MG per tablet Take 1 tablet by mouth every 6 (six) hours as needed for severe pain. 09/24/14   Carmin Muskrat, MD   BP 159/96 mmHg  Pulse 93  Resp 19  Ht 5' 9.5" (1.765 m)  Wt 185 lb (83.915 kg)  BMI 26.94 kg/m2  SpO2 100% Physical Exam  Constitutional: He is oriented to person, place, and time. He appears well-developed. No distress.  HENT:  Head: Normocephalic and atraumatic.  Eyes: Conjunctivae and EOM are normal.  Cardiovascular: Normal rate and regular rhythm.   Pulmonary/Chest: Effort normal. No stridor. No respiratory distress.  Abdominal: He exhibits no distension. There is no hepatosplenomegaly. There is tenderness in the suprapubic area. There is no rigidity, no guarding and no CVA tenderness.  Genitourinary:     Musculoskeletal: He exhibits no edema.  Neurological: He is alert and oriented to person, place, and time.  Skin: Skin is warm and dry.  Psychiatric: He has a normal mood and affect.  Nursing note and vitals reviewed.   ED Course  Procedures (including critical care time) Labs Review Labs Reviewed  URINALYSIS, ROUTINE W REFLEX MICROSCOPIC - Abnormal; Notable for the following:    Color, Urine RED (*)    APPearance TURBID (*)    Hgb urine dipstick LARGE (*)    Bilirubin Urine LARGE (*)    Ketones, ur >80 (*)    Protein, ur 100 (*)    Urobilinogen, UA 2.0 (*)    Nitrite POSITIVE (*)    Leukocytes, UA LARGE (*)    All other components within normal limits  COMPREHENSIVE METABOLIC PANEL - Abnormal; Notable for the following:    Sodium 132 (*)    Glucose, Bld 223 (*)    BUN 35 (*)    Creatinine, Ser 1.56 (*)    GFR calc non Af Amer 47 (*)    GFR calc Af Amer 54 (*)    All other components within normal limits  CBC WITH DIFFERENTIAL/PLATELET - Abnormal; Notable for the following:    WBC 11.1 (*)     Neutrophils Relative % 83 (*)    Neutro Abs 9.3 (*)    Lymphocytes Relative 9 (*)    All other components within normal limits  URINE MICROSCOPIC-ADD ON - Abnormal; Notable for the following:    Bacteria, UA MANY (*)    All other components within normal limits    Pulse ox 100% room air normal  I reviewed the results (including imaging as performed), agree  with the interpretation  On repeat exam the patient appears better.  We reviewed all findings.   MDM   Final diagnoses:  Urinary retention  UTI (lower urinary tract infection)    Patient presents with urinary retention, history of urinary tract infection. After some irrigation of the bladder, patient had clearing of urine. No evidence for bacteremia or sepsis. Patient does have a history of prostate cancer, has a urologist with whom you follow-up tomorrow via telephone to arrange appropriate ongoing care. No evidence for sepsis or bacteremia, patient was awake, alert, oriented appropriately, low suspicion for other acute new pathology.     Carmin Muskrat, MD 09/25/14 [phone]

## 2014-09-24 NOTE — ED Notes (Addendum)
Patient reports he was urinating normally today and three hours PTA he noticed he was unable to void.  Prostate removed due to CA in 2012.   Reports he is passing blood.  He is pale, diaphoretic.  Orthostatic: standing 87/62

## 2014-09-30 LAB — URINALYSIS, ROUTINE W REFLEX MICROSCOPIC
Glucose, UA: NEGATIVE mg/dL
Ketones, ur: >80 mg/dL — AB
Nitrite: POSITIVE — AB
PROTEIN: 100 mg/dL — AB
Specific Gravity, Urine: 1.015 (ref 1.005–1.030)
Urobilinogen, UA: 0.2 mg/dL (ref 0.0–1.0)
pH: 6 (ref 5.0–8.0)

## 2014-10-07 ENCOUNTER — Other Ambulatory Visit (HOSPITAL_COMMUNITY): Payer: Self-pay | Admitting: Urology

## 2014-10-07 DIAGNOSIS — R310 Gross hematuria: Secondary | ICD-10-CM

## 2014-10-09 ENCOUNTER — Ambulatory Visit (HOSPITAL_COMMUNITY): Payer: Self-pay

## 2015-01-04 ENCOUNTER — Encounter: Payer: Self-pay | Admitting: Gastroenterology

## 2015-03-30 ENCOUNTER — Encounter: Payer: Medicare Other | Admitting: Gastroenterology

## 2015-06-15 IMAGING — CR DG CHEST 2V
2 series · 2 of 2 positions shown · non-contrast
Comparison: 02/24/2011

CLINICAL DATA: Preop for cystoscopy.

EXAM:
CHEST  2 VIEW

[w chest pa]
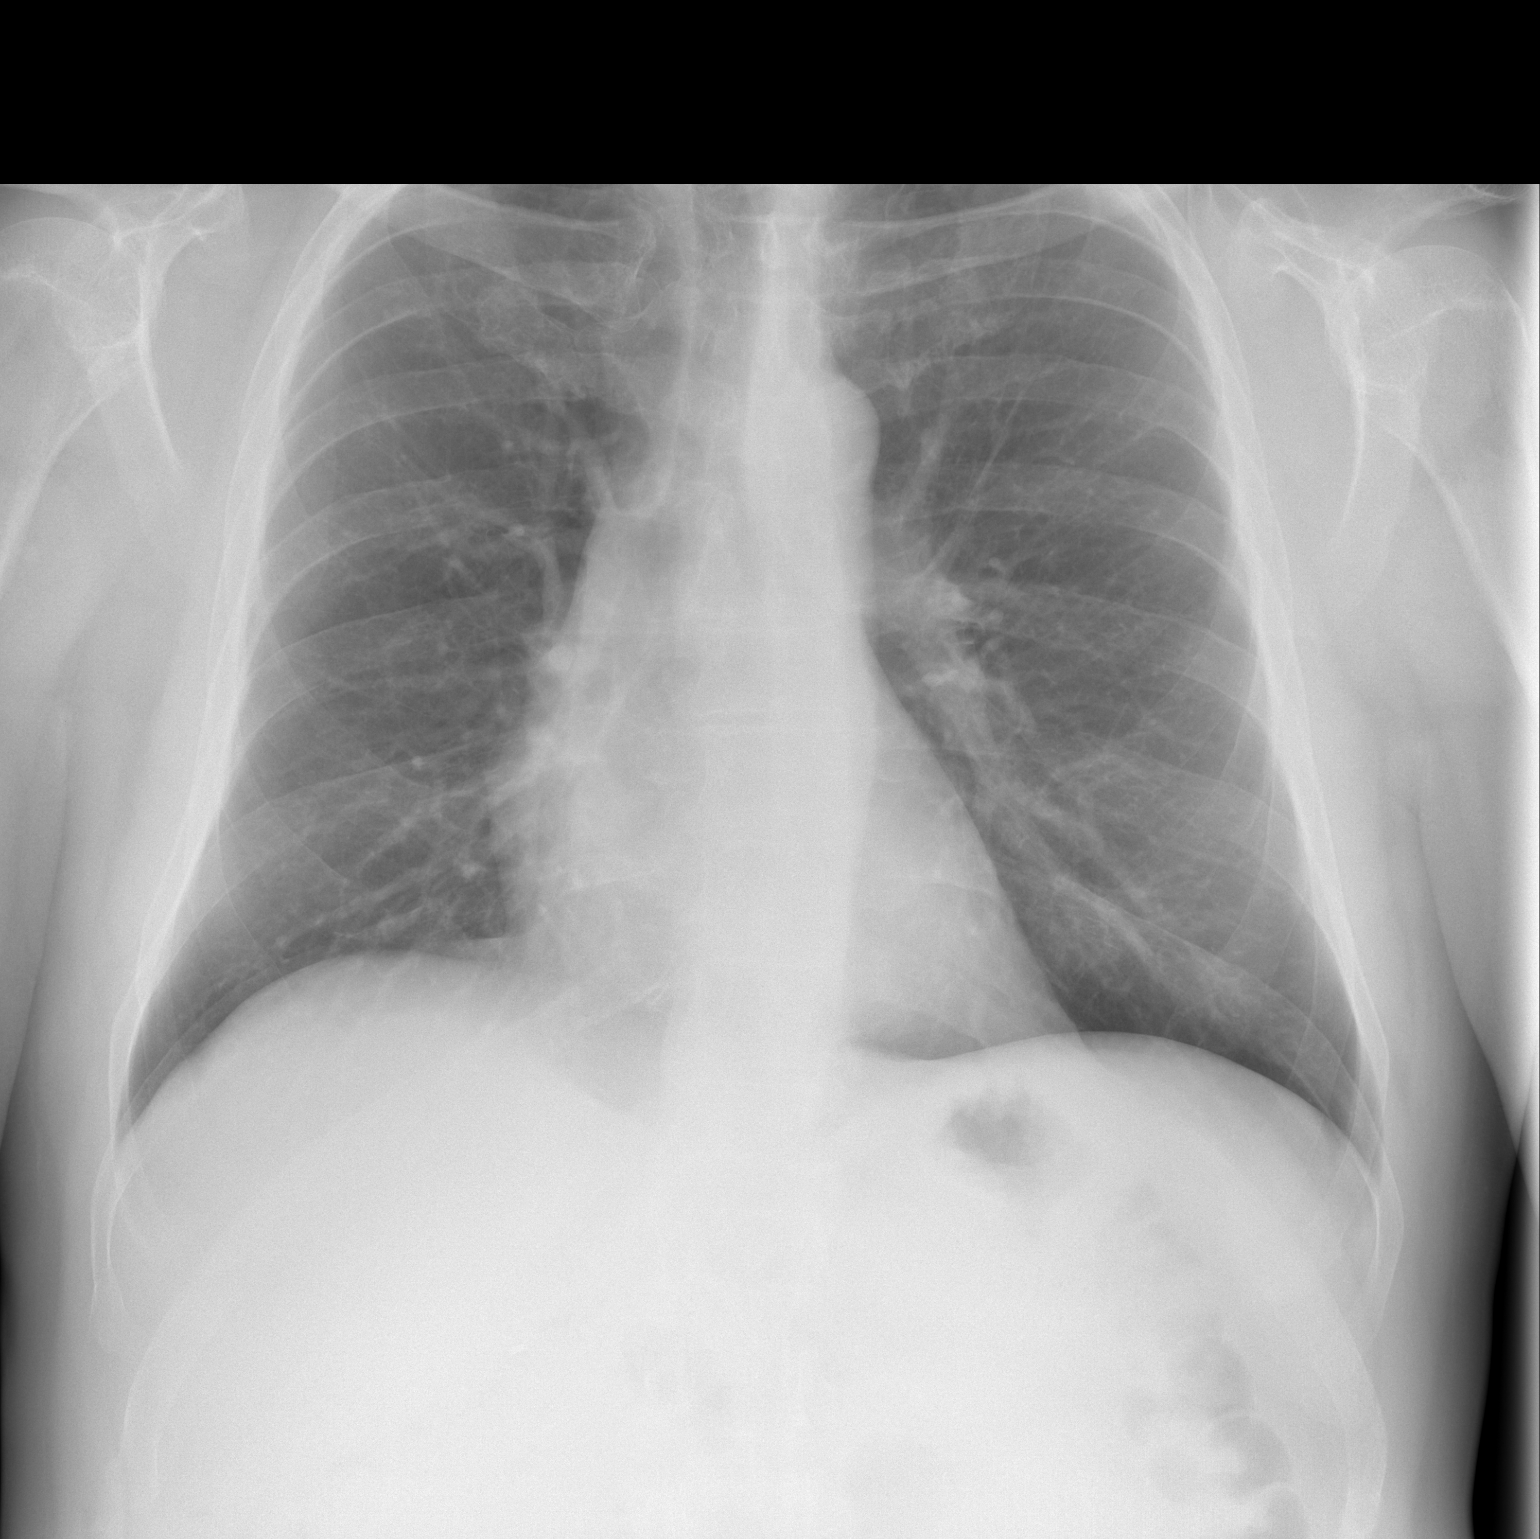

[w chest lat]
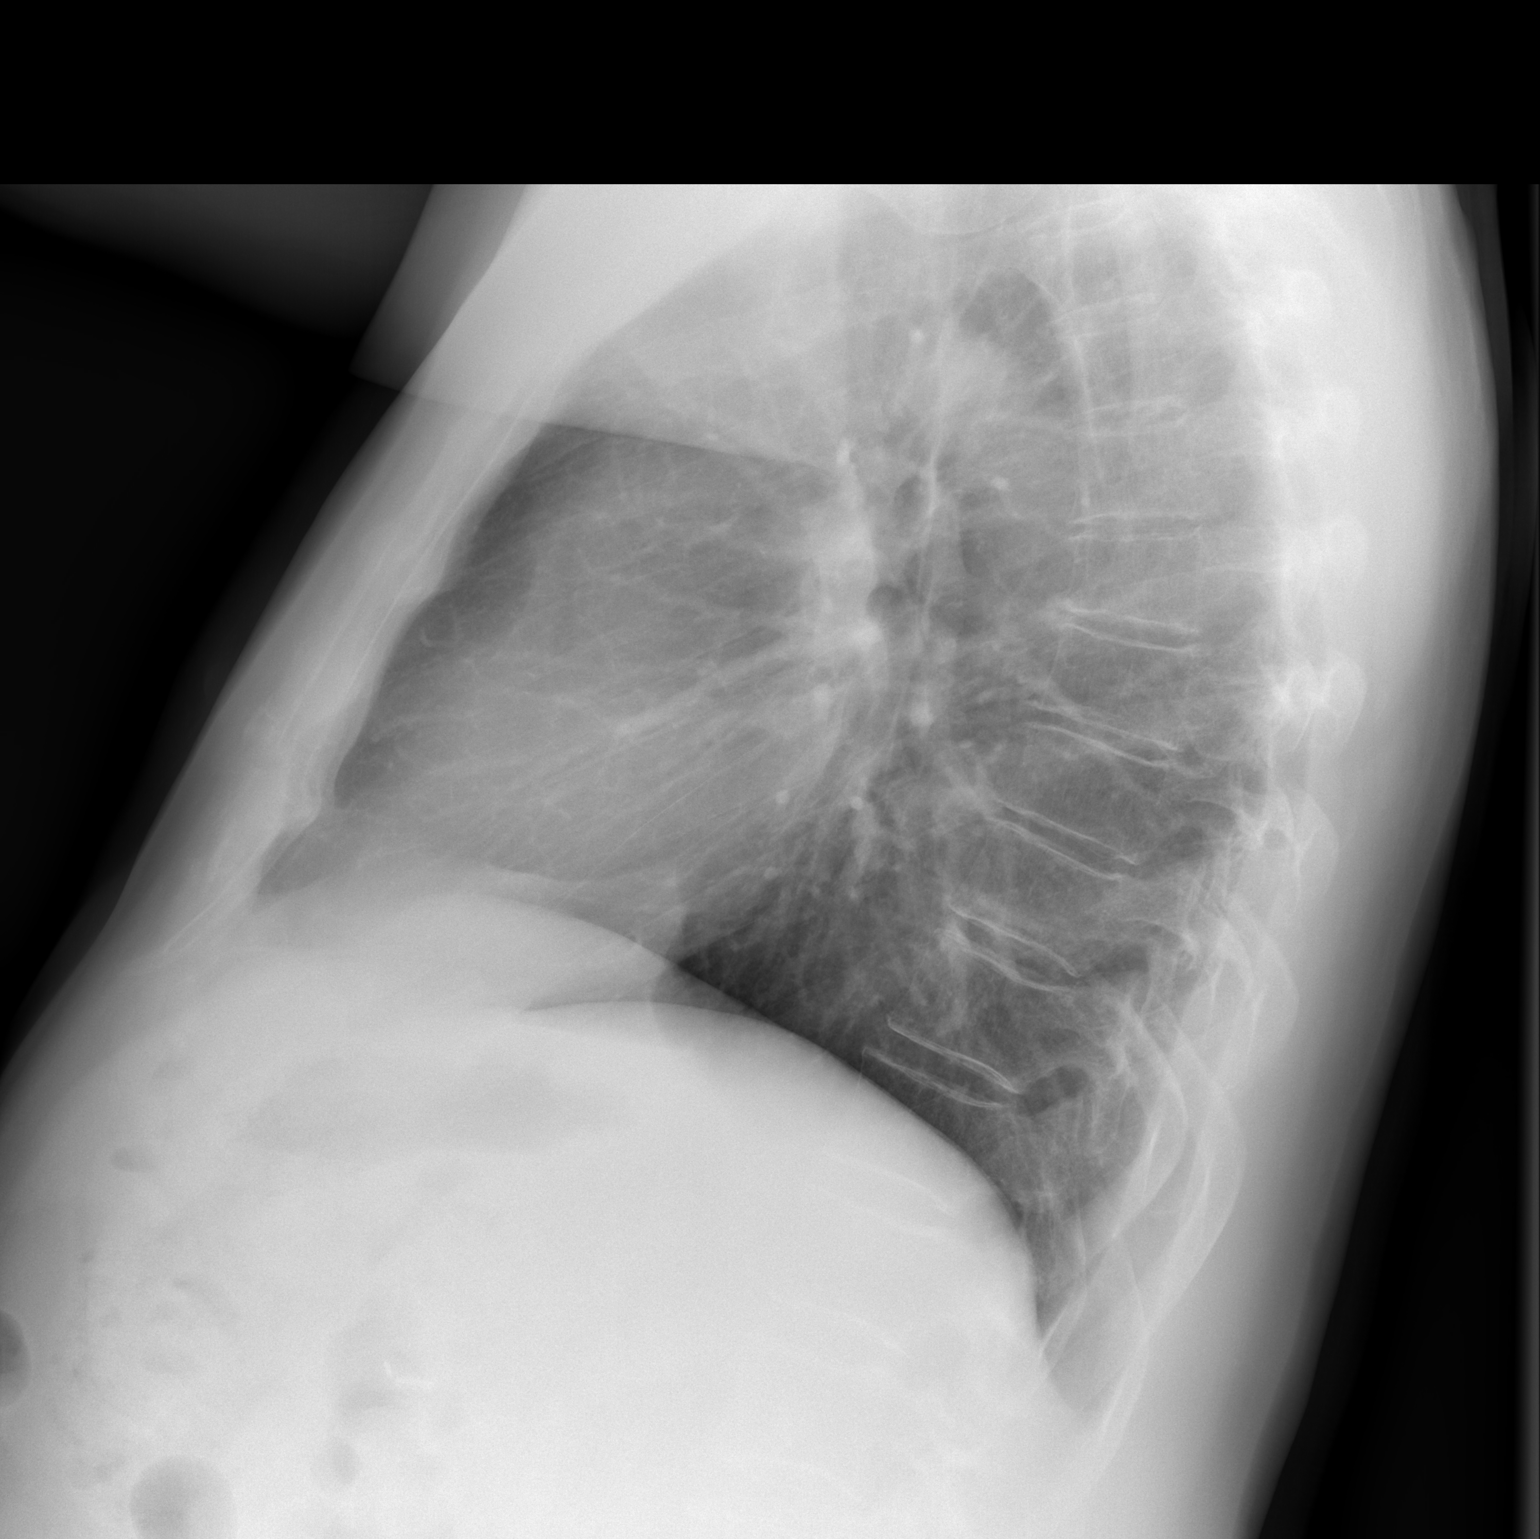

[2 of 2 positions shown; findings below may reference images not displayed]

FINDINGS: Two views of the chest demonstrate clear lungs. No evidence for
airspace disease or edema. Heart and mediastinum are stable. The
trachea is midline. Bony thorax is intact.
IMPRESSION: No acute cardiopulmonary disease.

## 2016-10-09 DIAGNOSIS — T83098A Other mechanical complication of other indwelling urethral catheter, initial encounter: Secondary | ICD-10-CM | POA: Diagnosis present

## 2016-10-09 DIAGNOSIS — Y732 Prosthetic and other implants, materials and accessory gastroenterology and urology devices associated with adverse incidents: Secondary | ICD-10-CM | POA: Insufficient documentation

## 2016-10-09 DIAGNOSIS — E119 Type 2 diabetes mellitus without complications: Secondary | ICD-10-CM | POA: Insufficient documentation

## 2016-10-09 DIAGNOSIS — Z79899 Other long term (current) drug therapy: Secondary | ICD-10-CM | POA: Diagnosis not present

## 2016-10-09 DIAGNOSIS — Z8546 Personal history of malignant neoplasm of prostate: Secondary | ICD-10-CM | POA: Insufficient documentation

## 2016-10-09 DIAGNOSIS — Z87891 Personal history of nicotine dependence: Secondary | ICD-10-CM | POA: Diagnosis not present

## 2016-10-09 DIAGNOSIS — Z7984 Long term (current) use of oral hypoglycemic drugs: Secondary | ICD-10-CM | POA: Insufficient documentation

## 2016-10-09 DIAGNOSIS — I10 Essential (primary) hypertension: Secondary | ICD-10-CM | POA: Diagnosis not present

## 2016-10-09 NOTE — ED Triage Notes (Signed)
Pt had a urinary catheter place on Saturday at [facility] for urinary retention He states it last drained about 3pm and it was 100cc of bright red urine, no urine since then

## 2016-10-10 ENCOUNTER — Emergency Department (HOSPITAL_COMMUNITY)
Admission: EM | Admit: 2016-10-10 | Discharge: 2016-10-10 | Disposition: A | Discharge status: Home / Self Care | Payer: Medicare HMO | Attending: Emergency Medicine | Admitting: Emergency Medicine

## 2016-10-10 DIAGNOSIS — T83091A Other mechanical complication of indwelling urethral catheter, initial encounter: Secondary | ICD-10-CM

## 2016-10-10 NOTE — Discharge Instructions (Signed)
Please follow up with your urologist for further care.

## 2016-10-10 NOTE — ED Notes (Signed)
Bladder scan showed approx. 400cc of urine retention.

## 2016-10-10 NOTE — ED Notes (Addendum)
Foley irrigated with 30cc of  sterile water, pt produced occasional small clots, and foley cath began to drain rendering 400 urinary output. Pt states that he felt relief, and no longer had abdominal pain or discomfort

## 2016-10-10 NOTE — ED Notes (Addendum)
Pt states that he has not been able to urinate since 3 pm yesterday. Pt has a foley cath in place and states that it has been leak around cath and has had a hx urethral  of blood clots. Pt has abdominal pain and pressure and marked abdominal distention.

## 2016-10-10 NOTE — ED Provider Notes (Signed)
[facility] DEPT Provider Note   CSN: [id number] Arrival date & time: 10/09/16  2047     History   Chief Complaint Chief Complaint  Patient presents with  . Urinary Retention    HPI Douglas Zhang is a 61 y.o. male.  HPI   62 year old male with history of prostate cancer status post radiation, diabetes presenting for urinary retention. Patient had a Foley catheter placed 4 days ago for urinary retention. He was able to make urine however, He reports noticing bright red blood per urine 10 hours ago and since then he has not produced any urine in the Foley bag. He is complaining of abdominal pain and distention since. He felt that maybe he may have been clogged up. Denies any associated fever, back pain or any recent injury. No specific treatment tried. Pain is described as tightness, soreness and pressure sensation, moderate in intensity and located in the suprapubic region.  Past Medical History:  Diagnosis Date  . AP (acute pancreatitis)    due to gallstones, history  . Cataract    no surgery, just forming  . Diabetes mellitus without complication   . ED (erectile dysfunction)   . GERD (gastroesophageal reflux disease)   . Headache(784.0)    migraines  . Hx of migraines   . Hyperlipidemia   . Hypertension   . Prostate cancer 01/09/11   biopsy-gleason 4+4=8  . Radiation 07/03/2012-08/21/2012   prostate 6600 cGy 33 sessions  . Stress incontinence, male     Patient Active Problem List   Diagnosis Date Noted  . Prostate cancer ([place])   . Stress incontinence, male     Past Surgical History:  Procedure Laterality Date  . CHOLECYSTECTOMY  1990  . CHOLECYSTECTOMY, LAPAROSCOPIC    . CYSTOSCOPY WITH BIOPSY N/A 06/12/2013   Procedure: CYSTOSCOPY WITH BIOPSY bladder washing;  Surgeon: Dutch Gray, MD;  Location: [facility];  Service: Urology;  Laterality: N/A;  BLADDER BIOPSY CYTOLOGY EXAM UNDER ANESTHESIA    . INGUINAL HERNIA REPAIR     bi-lateral  . PROSTATE SURGERY  03/02/11     gleason 4+4=8       Home Medications    Prior to Admission medications   Medication Sig Start Date End Date Taking? Authorizing Provider  calcium citrate-vitamin D (CITRACAL+D) 315-200 MG-UNIT per tablet Take 1 tablet by mouth 2 (two) times daily.    [provider]  cetirizine (ZYRTEC) 10 MG tablet Take 10 mg by mouth daily.     [provider]  ciprofloxacin (CIPRO) 500 MG tablet Take 1 tablet (500 mg total) by mouth 2 (two) times daily. 09/24/14   Carmin Muskrat, MD  glipiZIDE (GLUCOTROL XL) 5 MG 24 hr tablet Take 5 mg by mouth 2 (two) times daily.    [provider]  hydrochlorothiazide (HYDRODIURIL) 25 MG tablet Take 25 mg by mouth daily.  09/13/14   [provider]  HYDROcodone-acetaminophen (NORCO/VICODIN) 5-325 MG per tablet Take 1 tablet by mouth every 6 (six) hours as needed for severe pain. 09/24/14   Carmin Muskrat, MD  JANUVIA 100 MG tablet Take 100 mg by mouth daily.  07/28/14   [provider]  lisinopril (PRINIVIL,ZESTRIL) 20 MG tablet Take 20 mg by mouth daily with breakfast.     [provider]  metFORMIN (GLUCOPHAGE-XR) 500 MG 24 hr tablet Take 1,000 mg by mouth 2 (two) times daily.  09/15/14   [provider]  MYRBETRIQ 50 MG TB24 tablet Take 50 mg by mouth daily.  08/26/14  [provider]  omeprazole (PRILOSEC) 20 MG capsule Take 20 mg by mouth daily as needed (indigestion).     [provider]  pravastatin (PRAVACHOL) 80 MG tablet Take 80 mg by mouth daily.  09/13/14   [provider]  SUMAtriptan (IMITREX) 100 MG tablet Take 100 mg by mouth every 2 (two) hours as needed for migraine (migraine).     [provider]    Family History Family History  Problem Relation Age of Onset  . Stroke Father   . Cancer Maternal Grandmother     colon cancer    Social History Social History  Substance Use Topics  . Smoking status: Former Smoker    Packs/day: 1.00    Years:  5.00    Types: Cigarettes    Quit date: 01/31/1983  . Smokeless tobacco: Never Used  . Alcohol use No     Allergies   Patient has no known allergies.   Review of Systems Review of Systems  Genitourinary: Positive for difficulty urinating. Negative for testicular pain.  All other systems reviewed and are negative.    Physical Exam Updated Vital Signs BP 134/78 (BP Location: Left Arm)   Pulse 93   Temp 98.2 F (36.8 C) (Oral)   Resp 18   SpO2 95%   Physical Exam  Constitutional: He appears well-developed and well-nourished. No distress.  HENT:  Head: Atraumatic.  Eyes: Conjunctivae are normal.  Neck: Neck supple.  Abdominal: He exhibits distension (Abdomen is distended most significant at the suprapubic region, tender to palpation.).  Genitourinary:  Genitourinary Comments: Chaperone present during exam. Foley is in place, no urine in Foley bag.  Neurological: He is alert.  Skin: No rash noted.  Psychiatric: He has a normal mood and affect.  Nursing note and vitals reviewed.    ED Treatments / Results  Labs (all labs ordered are listed, but only abnormal results are displayed) Labs Reviewed - No data to display  EKG  EKG Interpretation None       Radiology No results found.  Procedures Procedures (including critical care time)  Medications Ordered in ED Medications - No data to display   Initial Impression / Assessment and Plan / ED Course  I have reviewed the triage vital signs and the nursing notes.  Pertinent labs & imaging results that were available during my care of the patient were reviewed by me and considered in my medical decision making (see chart for details).     BP 130/81 (BP Location: Left Arm)   Pulse 73   Temp 98.1 F (36.7 C) (Oral)   Resp 18   SpO2 97%    Final Clinical Impressions(s) / ED Diagnoses   Final diagnoses:  Obstruction of Foley catheter, initial encounter [facility])    New Prescriptions New Prescriptions    No medications on file   4:08 AM Patient came in with a clogged Foley catheter after he urinated blood through the Foley. Initial bladder scan shows approximately 400 mL of urine. He was complaining of low abdominal discomfort and does have a distended bladder. Successful irrigations the Foley which produce a small amount of clots and then the Foley were draining without difficulty. Approximately 400 mL of urine were noted in Foley bag. Patient felt much better. Encouraged patient to follow-up with his urologist for further management.   Domenic Moras, PA-C 10/10/16 0410    Rolland Porter, MD 10/10/16 [phone]

## 2023-01-16 ENCOUNTER — Other Ambulatory Visit (HOSPITAL_COMMUNITY): Payer: Self-pay | Admitting: Urology

## 2023-01-16 DIAGNOSIS — C61 Malignant neoplasm of prostate: Secondary | ICD-10-CM

## 2023-02-16 ENCOUNTER — Ambulatory Visit (HOSPITAL_COMMUNITY)
Admission: RE | Admit: 2023-02-16 | Discharge: 2023-02-16 | Disposition: A | Discharge status: Home / Self Care | Payer: Medicare HMO | Source: Ambulatory Visit | Attending: Urology | Admitting: Urology

## 2023-02-16 DIAGNOSIS — C61 Malignant neoplasm of prostate: Secondary | ICD-10-CM | POA: Diagnosis present

## 2023-02-16 MED ORDER — FLOTUFOLASTAT F 18 GALLIUM 296-5846 MBQ/ML IV SOLN
8.2000 | Freq: Once | INTRAVENOUS | Status: AC
  Administered 2023-02-16: 8.2 via INTRAVENOUS

## 2023-03-07 ENCOUNTER — Telehealth: Payer: Self-pay | Admitting: Emergency Medicine

## 2023-03-07 NOTE — Telephone Encounter (Signed)
Called and left VM for pt to call back to schedule pt a consult with Dr. Delton Coombes on 10/16 at 4pm (held slot).   Will forward to Dr. Laverle Patter as Lorain Childes.     ----- Message -----  From: Josephine Igo, DO  Sent: 03/06/2023   2:31 PM EDT  To: Leslye Peer, MD; Sheran Luz, RN    Can you get on mine or Rbs schedule next available?   Thanks   Brad  ----- Message -----  From: Heloise Purpura, MD  Sent: 02/27/2023   3:50 PM EDT  To: Josephine Igo, DO   Brad,   I hope you are well.  I wanted to touch base about this patient and see if you could peek at his recent PSMA PET scan.  He has what appears to be 3 avid pulmonary nodules that may be an unusual site for oligometastatic prostate cancer.  Do you think the largest one would be able to be biopsied?  I reviewed with Margaretmary Bayley and he would treat him with SBRT if confirmed to be metastatic disease since it is isolated to these 3 pulmonary lesions.  Thanks for looking.  If so, I will get him referred over to you.   Thanks so much!   Les

## 2023-03-21 ENCOUNTER — Ambulatory Visit: Payer: Medicare HMO | Admitting: Emergency Medicine

## 2023-03-21 ENCOUNTER — Encounter: Payer: Self-pay | Admitting: Emergency Medicine

## 2023-03-21 VITALS — BP 148/80 | HR 81 | Temp 98.3°F | Ht 69.0 in | Wt 170.4 lb

## 2023-03-21 DIAGNOSIS — R918 Other nonspecific abnormal finding of lung field: Secondary | ICD-10-CM | POA: Diagnosis not present

## 2023-03-21 NOTE — Assessment & Plan Note (Signed)
We reviewed his prostate specific PET scan today.  There are 3 discrete hypermetabolic pulmonary nodules, the largest is a lobulated 1.4 cm nodule in the left upper lobe.  These look accessible to navigational bronchoscopy.  Discussed the pros and cons of bronchoscopy and biopsy with him today as well as the differential diagnosis which includes prostate cancer, lung cancer, other.  He understands and agrees to proceed.  He last took his Ozempic on 10/12 and asked him not to take it this Saturday.  He will stop his aspirin 2 days prior.  Will try to get the procedure scheduled for 03/26/2023.  He will need a super D CT prior and I will order this today.

## 2023-03-21 NOTE — Patient Instructions (Signed)
We reviewed your prostate PET scan today. We will arrange for a robotic assisted navigational bronchoscopy to evaluate small pulmonary nodule seen on your scan.  This will be done under general anesthesia as an outpatient at [facility] endoscopy.  You need to stop your aspirin 2 days prior.  Do not take your Ozempic this coming Saturday.  Stay off of it until further notice.  You will need a designated driver and someone to watch you at home on that day after the procedure.  We will try to get this scheduled for 03/26/2023 You will need a CT scan of your chest and we will arrange for this to be done either before your procedure or on the day of your procedure at the hospital Follow with APP

## 2023-03-21 NOTE — H&P (View-Only) (Signed)
Subjective:    Patient ID: Douglas Zhang, male    DOB: 1955/09/01, 67 y.o.   MRN: [id number]  HPI 67 year old former smoker (25+ pack years) with a history of diabetes, hypertension, hyperlipidemia, prostate cancer that has been followed by Dr. Laverle Patter with Urology, post prostatectomy.  He was found to have an elevated PSA of 5.06 which prompted a prostate-specific PET scan on 02/16/2023 as below. He has fatigue. Has lost about 15 lbs in the last 6 months. He can walk, do whatever chores he wants. No cough. He is on ASA, took his Ozempic on 10/12.   Prostate-specific PET scan 02/16/2023 reviewed by me, shows that he is post prostatectomy with no abnormal radiotracer in the prostate fossa.  There were 3 radiotracer avid pulmonary nodules including a 14 mm nodule in the left upper lobe, 9 mm nodule in the lingula, 8 mm nodule in the right upper lobe suspicious for metastatic disease.   Review of Systems As per HPi  Past Medical History:  Diagnosis Date   AP (acute pancreatitis)    due to gallstones, history   Cataract    no surgery, just forming   Diabetes mellitus without complication (HCC)    ED (erectile dysfunction)    GERD (gastroesophageal reflux disease)    Headache(784.0)    migraines   Hx of migraines    Hyperlipidemia    Hypertension    Prostate cancer (HCC) 01/09/11   biopsy-gleason 4+4=8   Radiation 07/03/2012-08/21/2012   prostate 6600 cGy 33 sessions   Stress incontinence, male      Family History  Problem Relation Age of Onset   Stroke Father    Cancer Maternal Grandmother        colon cancer     Social History   Socioeconomic History   Marital status: Legally Separated    Spouse name: Not on file   Number of children: Not on file   Years of education: Not on file   Highest education level: Not on file  Occupational History    Employer: [employer]    Comment: Barrister's clerk long distance  Tobacco Use   Smoking status: Former    Current packs/day:  0.00    Average packs/day: 1 pack/day for 5.0 years (5.0 ttl pk-yrs)    Types: Cigarettes    Start date: 01/30/1978    Quit date: 01/31/1983    Years since quitting: 40.1   Smokeless tobacco: Never  Substance and Sexual Activity   Alcohol use: No   Drug use: No   Sexual activity: Never  Other Topics Concern   Not on file  Social History Narrative   Not on file   Social Determinants of Health   Financial Resource Strain: Low Risk  (05/03/2022)   Received from [facility]   Overall Financial Resource Strain (CARDIA)    Difficulty of Paying Living Expenses: Not hard at all  Food Insecurity: No Food Insecurity (11/15/2022)   Received from [facility]   Hunger Vital Sign    Worried About Running Out of Food in the Last Year: Never true    Ran Out of Food in the Last Year: Never true  Transportation Needs: No Transportation Needs (11/15/2022)   Received from [facility] - Transportation    Lack of Transportation (Medical): No    Lack of Transportation (Non-Medical): No  Physical Activity: Insufficiently Active (05/03/2022)   Received from [facility]   Exercise Vital Sign  Days of Exercise per Week: 7 days    Minutes of Exercise per Session: 20 min  Stress: No Stress Concern Present (11/15/2022)   Received from [facility] - Occupational Stress Questionnaire    Feeling of Stress : Only a little  Social Connections: Socially Integrated (05/03/2022)   Received from [facility]   Social Network    How would you rate your social network (family, work, friends)?: Good participation with social networks  Intimate Partner Violence: Not At Risk (11/15/2022)   Received from [facility]   HITS    Over the last 12 months how often did your partner physically hurt you?: 1    Over the last 12 months how often did your partner insult you or talk down to you?: 1    Over the last 12 months how often did your partner threaten  you with physical harm?: 1    Over the last 12 months how often did your partner scream or curse at you?: 1     No Known Allergies   Outpatient Medications Prior to Visit  Medication Sig Dispense Refill   aspirin EC 81 MG tablet Take by mouth.     atorvastatin (LIPITOR) 40 MG tablet Take by mouth.     calcium citrate-vitamin D (CITRACAL+D) 315-200 MG-UNIT per tablet Take 1 tablet by mouth 2 (two) times daily.     cetirizine (ZYRTEC) 10 MG tablet Take 10 mg by mouth daily.     cyanocobalamin ([employer] VITAMIN B12) 1000 MCG tablet Take 1 tablet by mouth daily.     glipiZIDE (GLUCOTROL XL) 10 MG 24 hr tablet Take 10 mg by mouth daily.     hydrochlorothiazide (HYDRODIURIL) 25 MG tablet Take 25 mg by mouth daily.      lisinopril (PRINIVIL,ZESTRIL) 20 MG tablet Take 20 mg by mouth daily with breakfast.      magnesium oxide (MAG-OX) 400 MG tablet Take by mouth.     metFORMIN (GLUCOPHAGE-XR) 500 MG 24 hr tablet Take 1,000 mg by mouth 2 (two) times daily.      Multiple Vitamins-Minerals (CENTRUM SILVER 50+MEN) TABS Take 1 tablet by mouth daily.     MYRBETRIQ 50 MG TB24 tablet Take 50 mg by mouth daily.      omeprazole (PRILOSEC) 20 MG capsule Take 20 mg by mouth daily as needed (indigestion).      OZEMPIC, 0.25 OR 0.5 MG/DOSE, 2 MG/3ML SOPN INJECT 1/2 (ONE-HALF) MG SUBCUTANEOUSLY ONCE A WEEK     SUMAtriptan (IMITREX) 100 MG tablet Take 100 mg by mouth every 2 (two) hours as needed for migraine (migraine).      valsartan (DIOVAN) 80 MG tablet Take by mouth.     HYDROcodone-acetaminophen (NORCO/VICODIN) 5-325 MG per tablet Take 1 tablet by mouth every 6 (six) hours as needed for severe pain. (Patient not taking: Reported on 03/21/2023) 15 tablet 0   JANUVIA 100 MG tablet Take 100 mg by mouth daily.  (Patient not taking: Reported on 03/21/2023)     pravastatin (PRAVACHOL) 80 MG tablet Take 80 mg by mouth daily.  (Patient not taking: Reported on 03/21/2023)     CALCIUM CITRATE-VITAMIN D PO Take 1 tablet by  mouth 2 (two) times daily.     ciprofloxacin (CIPRO) 500 MG tablet Take 1 tablet (500 mg total) by mouth 2 (two) times daily. (Patient not taking: Reported on 03/21/2023) 14 tablet 0   glipiZIDE (GLUCOTROL XL) 5 MG 24 hr tablet Take 5  mg by mouth 2 (two) times daily.     No facility-administered medications prior to visit.        Objective:   Physical Exam Vitals:   03/21/23 1603 03/21/23 1607  BP: (!) 143/91 (!) 148/80  Pulse: 81   Temp: 98.3 F (36.8 C)   TempSrc: Oral   SpO2: 96%   Weight: 170 lb 6.4 oz (77.3 kg)   Height: 5\' 9"  (1.753 m)    Gen: Pleasant, well-nourished, in no distress,  normal affect  ENT: No lesions,  mouth clear,  oropharynx clear, no postnasal drip  Neck: No JVD, no stridor  Lungs: No use of accessory muscles, no crackles or wheezing on normal respiration, no wheeze on forced expiration  Cardiovascular: RRR, heart sounds normal, no murmur or gallops, no peripheral edema  Musculoskeletal: No deformities, no cyanosis or clubbing  Neuro: alert, awake, non focal  Skin: Warm, no lesions or rash       Assessment & Plan:  Pulmonary nodules We reviewed his prostate specific PET scan today.  There are 3 discrete hypermetabolic pulmonary nodules, the largest is a lobulated 1.4 cm nodule in the left upper lobe.  These look accessible to navigational bronchoscopy.  Discussed the pros and cons of bronchoscopy and biopsy with him today as well as the differential diagnosis which includes prostate cancer, lung cancer, other.  He understands and agrees to proceed.  He last took his Ozempic on 10/12 and asked him not to take it this Saturday.  He will stop his aspirin 2 days prior.  Will try to get the procedure scheduled for 03/26/2023.  He will need a super D CT prior and I will order this today.   Levy Pupa, MD, PhD 03/21/2023, 4:55 PM [facility] [phone] or if no answer before 7:00PM call [phone] For any issues after  7:00PM please call eLink [phone]

## 2023-03-21 NOTE — Progress Notes (Signed)
Subjective:    Patient ID: Douglas Zhang, male    DOB: 1955/09/01, 67 y.o.   MRN: [id number]  HPI 67 year old former smoker (25+ pack years) with a history of diabetes, hypertension, hyperlipidemia, prostate cancer that has been followed by Dr. Laverle Patter with Urology, post prostatectomy.  He was found to have an elevated PSA of 5.06 which prompted a prostate-specific PET scan on 02/16/2023 as below. He has fatigue. Has lost about 15 lbs in the last 6 months. He can walk, do whatever chores he wants. No cough. He is on ASA, took his Ozempic on 10/12.   Prostate-specific PET scan 02/16/2023 reviewed by me, shows that he is post prostatectomy with no abnormal radiotracer in the prostate fossa.  There were 3 radiotracer avid pulmonary nodules including a 14 mm nodule in the left upper lobe, 9 mm nodule in the lingula, 8 mm nodule in the right upper lobe suspicious for metastatic disease.   Review of Systems As per HPi  Past Medical History:  Diagnosis Date   AP (acute pancreatitis)    due to gallstones, history   Cataract    no surgery, just forming   Diabetes mellitus without complication (HCC)    ED (erectile dysfunction)    GERD (gastroesophageal reflux disease)    Headache(784.0)    migraines   Hx of migraines    Hyperlipidemia    Hypertension    Prostate cancer (HCC) 01/09/11   biopsy-gleason 4+4=8   Radiation 07/03/2012-08/21/2012   prostate 6600 cGy 33 sessions   Stress incontinence, male      Family History  Problem Relation Age of Onset   Stroke Father    Cancer Maternal Grandmother        colon cancer     Social History   Socioeconomic History   Marital status: Legally Separated    Spouse name: Not on file   Number of children: Not on file   Years of education: Not on file   Highest education level: Not on file  Occupational History    Employer: [employer]    Comment: Barrister's clerk long distance  Tobacco Use   Smoking status: Former    Current packs/day:  0.00    Average packs/day: 1 pack/day for 5.0 years (5.0 ttl pk-yrs)    Types: Cigarettes    Start date: 01/30/1978    Quit date: 01/31/1983    Years since quitting: 40.1   Smokeless tobacco: Never  Substance and Sexual Activity   Alcohol use: No   Drug use: No   Sexual activity: Never  Other Topics Concern   Not on file  Social History Narrative   Not on file   Social Determinants of Health   Financial Resource Strain: Low Risk  (05/03/2022)   Received from [facility]   Overall Financial Resource Strain (CARDIA)    Difficulty of Paying Living Expenses: Not hard at all  Food Insecurity: No Food Insecurity (11/15/2022)   Received from [facility]   Hunger Vital Sign    Worried About Running Out of Food in the Last Year: Never true    Ran Out of Food in the Last Year: Never true  Transportation Needs: No Transportation Needs (11/15/2022)   Received from [facility] - Transportation    Lack of Transportation (Medical): No    Lack of Transportation (Non-Medical): No  Physical Activity: Insufficiently Active (05/03/2022)   Received from [facility]   Exercise Vital Sign  Days of Exercise per Week: 7 days    Minutes of Exercise per Session: 20 min  Stress: No Stress Concern Present (11/15/2022)   Received from [facility] - Occupational Stress Questionnaire    Feeling of Stress : Only a little  Social Connections: Socially Integrated (05/03/2022)   Received from [facility]   Social Network    How would you rate your social network (family, work, friends)?: Good participation with social networks  Intimate Partner Violence: Not At Risk (11/15/2022)   Received from [facility]   HITS    Over the last 12 months how often did your partner physically hurt you?: 1    Over the last 12 months how often did your partner insult you or talk down to you?: 1    Over the last 12 months how often did your partner threaten  you with physical harm?: 1    Over the last 12 months how often did your partner scream or curse at you?: 1     No Known Allergies   Outpatient Medications Prior to Visit  Medication Sig Dispense Refill   aspirin EC 81 MG tablet Take by mouth.     atorvastatin (LIPITOR) 40 MG tablet Take by mouth.     calcium citrate-vitamin D (CITRACAL+D) 315-200 MG-UNIT per tablet Take 1 tablet by mouth 2 (two) times daily.     cetirizine (ZYRTEC) 10 MG tablet Take 10 mg by mouth daily.     cyanocobalamin ([employer] VITAMIN B12) 1000 MCG tablet Take 1 tablet by mouth daily.     glipiZIDE (GLUCOTROL XL) 10 MG 24 hr tablet Take 10 mg by mouth daily.     hydrochlorothiazide (HYDRODIURIL) 25 MG tablet Take 25 mg by mouth daily.      lisinopril (PRINIVIL,ZESTRIL) 20 MG tablet Take 20 mg by mouth daily with breakfast.      magnesium oxide (MAG-OX) 400 MG tablet Take by mouth.     metFORMIN (GLUCOPHAGE-XR) 500 MG 24 hr tablet Take 1,000 mg by mouth 2 (two) times daily.      Multiple Vitamins-Minerals (CENTRUM SILVER 50+MEN) TABS Take 1 tablet by mouth daily.     MYRBETRIQ 50 MG TB24 tablet Take 50 mg by mouth daily.      omeprazole (PRILOSEC) 20 MG capsule Take 20 mg by mouth daily as needed (indigestion).      OZEMPIC, 0.25 OR 0.5 MG/DOSE, 2 MG/3ML SOPN INJECT 1/2 (ONE-HALF) MG SUBCUTANEOUSLY ONCE A WEEK     SUMAtriptan (IMITREX) 100 MG tablet Take 100 mg by mouth every 2 (two) hours as needed for migraine (migraine).      valsartan (DIOVAN) 80 MG tablet Take by mouth.     HYDROcodone-acetaminophen (NORCO/VICODIN) 5-325 MG per tablet Take 1 tablet by mouth every 6 (six) hours as needed for severe pain. (Patient not taking: Reported on 03/21/2023) 15 tablet 0   JANUVIA 100 MG tablet Take 100 mg by mouth daily.  (Patient not taking: Reported on 03/21/2023)     pravastatin (PRAVACHOL) 80 MG tablet Take 80 mg by mouth daily.  (Patient not taking: Reported on 03/21/2023)     CALCIUM CITRATE-VITAMIN D PO Take 1 tablet by  mouth 2 (two) times daily.     ciprofloxacin (CIPRO) 500 MG tablet Take 1 tablet (500 mg total) by mouth 2 (two) times daily. (Patient not taking: Reported on 03/21/2023) 14 tablet 0   glipiZIDE (GLUCOTROL XL) 5 MG 24 hr tablet Take 5  mg by mouth 2 (two) times daily.     No facility-administered medications prior to visit.        Objective:   Physical Exam Vitals:   03/21/23 1603 03/21/23 1607  BP: (!) 143/91 (!) 148/80  Pulse: 81   Temp: 98.3 F (36.8 C)   TempSrc: Oral   SpO2: 96%   Weight: 170 lb 6.4 oz (77.3 kg)   Height: 5\' 9"  (1.753 m)    Gen: Pleasant, well-nourished, in no distress,  normal affect  ENT: No lesions,  mouth clear,  oropharynx clear, no postnasal drip  Neck: No JVD, no stridor  Lungs: No use of accessory muscles, no crackles or wheezing on normal respiration, no wheeze on forced expiration  Cardiovascular: RRR, heart sounds normal, no murmur or gallops, no peripheral edema  Musculoskeletal: No deformities, no cyanosis or clubbing  Neuro: alert, awake, non focal  Skin: Warm, no lesions or rash       Assessment & Plan:  Pulmonary nodules We reviewed his prostate specific PET scan today.  There are 3 discrete hypermetabolic pulmonary nodules, the largest is a lobulated 1.4 cm nodule in the left upper lobe.  These look accessible to navigational bronchoscopy.  Discussed the pros and cons of bronchoscopy and biopsy with him today as well as the differential diagnosis which includes prostate cancer, lung cancer, other.  He understands and agrees to proceed.  He last took his Ozempic on 10/12 and asked him not to take it this Saturday.  He will stop his aspirin 2 days prior.  Will try to get the procedure scheduled for 03/26/2023.  He will need a super D CT prior and I will order this today.   Levy Pupa, MD, PhD 03/21/2023, 4:55 PM [facility] [phone] or if no answer before 7:00PM call [phone] For any issues after  7:00PM please call eLink [phone]

## 2023-03-26 ENCOUNTER — Encounter (HOSPITAL_COMMUNITY): Payer: Self-pay | Admitting: Emergency Medicine

## 2023-03-26 ENCOUNTER — Other Ambulatory Visit: Payer: Self-pay

## 2023-03-26 NOTE — Plan of Care (Signed)
[facility] Tonsillectomy/Adenoidectomy, Postoperative PEDS care plan entered in error.

## 2023-03-26 NOTE — Pre-Procedure Instructions (Signed)
PCP - Paulino Door, MD  EKG - DOS  Fasting Blood Sugar - 108-115 Checks Blood Sugar 1/day  Aspirin Instructions: Stopped on 10/16  Anesthesia review: N  Patient verbally denies any shortness of breath, fever, cough and chest pain during phone call   -------------  SDW INSTRUCTIONS given:  Your procedure is scheduled on Tuesday, October 22nd.  Report to [facility] Main Entrance "A" at 0730 A.M., and check in at the Admitting office.  Call this number if you have problems the morning of surgery:  [phone]   Remember:  Do not eat or drink after midnight the night before your surgery     Take these medicines the morning of surgery with A SIP OF WATER  cetirizine (ZYRTEC)  MYRBETRIQ  omeprazole (PRILOSEC)  atorvastatin (LIPITOR)  SUMAtriptan (IMITREX)-if needed  ** PLEASE check your blood sugar the morning of your surgery when you wake up and every 2 hours until you get to the Short Stay unit.  If your blood sugar is less than 70 mg/dL, you will need to treat for low blood sugar: Do not take insulin. Treat a low blood sugar (less than 70 mg/dL) with  cup of clear juice (cranberry or apple), 4 glucose tablets, OR glucose gel. Recheck blood sugar in 15 minutes after treatment (to make sure it is greater than 70 mg/dL). If your blood sugar is not greater than 70 mg/dL on recheck, call [phone] for further instructions.  As of today, STOP taking any Aspirin (unless otherwise instructed by your surgeon) Aleve, Naproxen, Ibuprofen, Motrin, Advil, Goody's, BC's, all herbal medications, fish oil, and all vitamins.                      Do not wear jewelry, make up, or nail polish            Do not wear lotions, powders, perfumes/colognes, or deodorant.            Do not shave 48 hours prior to surgery.  Men may shave face and neck.            Do not bring valuables to the hospital.            [facility] is not responsible for any belongings or valuables.  Do NOT Smoke  (Tobacco/Vaping) 24 hours prior to your procedure If you use a CPAP at night, you may bring all equipment for your overnight stay.   Contacts, glasses, dentures or bridgework may not be worn into surgery.      For patients admitted to the hospital, discharge time will be determined by your treatment team.   Patients discharged the day of surgery will not be allowed to drive home, and someone needs to stay with them for 24 hours.    Special instructions:   [facility]- Preparing For Surgery  Before surgery, you can play an important role. Because skin is not sterile, your skin needs to be as free of germs as possible. You can reduce the number of germs on your skin by washing with CHG (chlorahexidine gluconate) Soap before surgery.  CHG is an antiseptic cleaner which kills germs and bonds with the skin to continue killing germs even after washing.    Oral Hygiene is also important to reduce your risk of infection.  Remember - BRUSH YOUR TEETH THE MORNING OF SURGERY WITH YOUR REGULAR TOOTHPASTE  Please do not use if you have an allergy to CHG or antibacterial soaps. If your  skin becomes reddened/irritated stop using the CHG.  Do not shave (including legs and underarms) for at least 48 hours prior to first CHG shower. It is OK to shave your face.  Please follow these instructions carefully.   Shower the NIGHT BEFORE SURGERY and the MORNING OF SURGERY with DIAL Soap.   Pat yourself dry with a CLEAN TOWEL.  Wear CLEAN PAJAMAS to bed the night before surgery  Place CLEAN SHEETS on your bed the night of your first shower and DO NOT SLEEP WITH PETS.   Day of Surgery: Please shower morning of surgery  Wear Clean/Comfortable clothing the morning of surgery Do not apply any deodorants/lotions.   Remember to brush your teeth WITH YOUR REGULAR TOOTHPASTE.   Questions were answered. Patient verbalized understanding of instructions.

## 2023-03-27 ENCOUNTER — Encounter (HOSPITAL_COMMUNITY)
Admission: RE | Disposition: A | Discharge status: Home / Self Care | Payer: Self-pay | Source: Home / Self Care | Attending: Emergency Medicine

## 2023-03-27 ENCOUNTER — Ambulatory Visit (HOSPITAL_COMMUNITY): Payer: Medicare HMO

## 2023-03-27 ENCOUNTER — Ambulatory Visit (HOSPITAL_COMMUNITY): Payer: Medicare HMO | Admitting: Anesthesiology

## 2023-03-27 ENCOUNTER — Ambulatory Visit (HOSPITAL_BASED_OUTPATIENT_CLINIC_OR_DEPARTMENT_OTHER): Payer: Medicare HMO | Admitting: Anesthesiology

## 2023-03-27 ENCOUNTER — Ambulatory Visit (HOSPITAL_COMMUNITY)
Admission: RE | Admit: 2023-03-27 | Discharge: 2023-03-27 | Disposition: A | Discharge status: Home / Self Care | Payer: Medicare HMO | Attending: Emergency Medicine | Admitting: Emergency Medicine

## 2023-03-27 ENCOUNTER — Encounter (HOSPITAL_COMMUNITY): Payer: Self-pay | Admitting: Emergency Medicine

## 2023-03-27 DIAGNOSIS — K219 Gastro-esophageal reflux disease without esophagitis: Secondary | ICD-10-CM | POA: Diagnosis not present

## 2023-03-27 DIAGNOSIS — R918 Other nonspecific abnormal finding of lung field: Secondary | ICD-10-CM | POA: Diagnosis present

## 2023-03-27 DIAGNOSIS — G43909 Migraine, unspecified, not intractable, without status migrainosus: Secondary | ICD-10-CM | POA: Diagnosis not present

## 2023-03-27 DIAGNOSIS — E119 Type 2 diabetes mellitus without complications: Secondary | ICD-10-CM | POA: Insufficient documentation

## 2023-03-27 DIAGNOSIS — Z79899 Other long term (current) drug therapy: Secondary | ICD-10-CM | POA: Diagnosis not present

## 2023-03-27 DIAGNOSIS — C3412 Malignant neoplasm of upper lobe, left bronchus or lung: Secondary | ICD-10-CM | POA: Diagnosis not present

## 2023-03-27 DIAGNOSIS — I10 Essential (primary) hypertension: Secondary | ICD-10-CM | POA: Diagnosis not present

## 2023-03-27 DIAGNOSIS — Z7984 Long term (current) use of oral hypoglycemic drugs: Secondary | ICD-10-CM | POA: Insufficient documentation

## 2023-03-27 DIAGNOSIS — Z87891 Personal history of nicotine dependence: Secondary | ICD-10-CM | POA: Insufficient documentation

## 2023-03-27 DIAGNOSIS — Z8546 Personal history of malignant neoplasm of prostate: Secondary | ICD-10-CM | POA: Insufficient documentation

## 2023-03-27 DIAGNOSIS — Z7985 Long-term (current) use of injectable non-insulin antidiabetic drugs: Secondary | ICD-10-CM | POA: Insufficient documentation

## 2023-03-27 HISTORY — PX: BRONCHIAL BRUSHINGS: SHX5108

## 2023-03-27 HISTORY — DX: COVID-19: U07.1

## 2023-03-27 HISTORY — PX: BRONCHIAL NEEDLE ASPIRATION BIOPSY: SHX5106

## 2023-03-27 HISTORY — PX: BRONCHIAL BIOPSY: SHX5109

## 2023-03-27 HISTORY — PX: VIDEO BRONCHOSCOPY WITH RADIAL ENDOBRONCHIAL ULTRASOUND: SHX6849

## 2023-03-27 LAB — CBC
HCT: 40.3 % (ref 39.0–52.0)
Hemoglobin: 13 g/dL (ref 13.0–17.0)
MCH: 28.1 pg (ref 26.0–34.0)
MCHC: 32.3 g/dL (ref 30.0–36.0)
MCV: 87.2 fL (ref 80.0–100.0)
Platelets: 144 10*3/uL — ABNORMAL LOW (ref 150–400)
RBC: 4.62 MIL/uL (ref 4.22–5.81)
RDW: 13 % (ref 11.5–15.5)
WBC: 5 10*3/uL (ref 4.0–10.5)
nRBC: 0 % (ref 0.0–0.2)

## 2023-03-27 LAB — BASIC METABOLIC PANEL
Anion gap: 15 (ref 5–15)
BUN: 29 mg/dL — ABNORMAL HIGH (ref 8–23)
CO2: 23 mmol/L (ref 22–32)
Calcium: 10.3 mg/dL (ref 8.9–10.3)
Chloride: 105 mmol/L (ref 98–111)
Creatinine, Ser: 1.4 mg/dL — ABNORMAL HIGH (ref 0.61–1.24)
GFR, Estimated: 55 mL/min — ABNORMAL LOW (ref >60)
Glucose, Bld: 105 mg/dL — ABNORMAL HIGH (ref 70–99)
Potassium: 4.1 mmol/L (ref 3.5–5.1)
Sodium: 143 mmol/L (ref 135–145)

## 2023-03-27 LAB — GLUCOSE, CAPILLARY
Glucose-Capillary: 95 mg/dL (ref 70–99)
Glucose-Capillary: 129 mg/dL — ABNORMAL HIGH (ref 70–99)

## 2023-03-27 SURGERY — BRONCHOSCOPY, WITH BIOPSY USING ELECTROMAGNETIC NAVIGATION
Anesthesia: General

## 2023-03-27 MED ORDER — SUGAMMADEX SODIUM 200 MG/2ML IV SOLN
INTRAVENOUS | Status: DC | PRN
  Administered 2023-03-27: 200 mg via INTRAVENOUS

## 2023-03-27 MED ORDER — PHENYLEPHRINE 80 MCG/ML (10ML) SYRINGE FOR IV PUSH (FOR BLOOD PRESSURE SUPPORT)
PREFILLED_SYRINGE | INTRAVENOUS | Status: DC | PRN
  Administered 2023-03-27 (×2): 160 ug via INTRAVENOUS

## 2023-03-27 MED ORDER — ROCURONIUM BROMIDE 10 MG/ML (PF) SYRINGE
PREFILLED_SYRINGE | INTRAVENOUS | Status: DC | PRN
  Administered 2023-03-27: 20 mg via INTRAVENOUS
  Administered 2023-03-27: 60 mg via INTRAVENOUS
  Administered 2023-03-27: 10 mg via INTRAVENOUS

## 2023-03-27 MED ORDER — INSULIN ASPART 100 UNIT/ML IJ SOLN
0.0000 [IU] | INTRAMUSCULAR | Status: DC | PRN
Start: 2023-03-27 — End: 2023-03-27

## 2023-03-27 MED ORDER — CHLORHEXIDINE GLUCONATE 0.12 % MT SOLN: 15.0000 mL | Freq: Once | OROMUCOSAL | Status: AC

## 2023-03-27 MED ORDER — FENTANYL CITRATE (PF) 100 MCG/2ML IJ SOLN: 25.0000 ug | INTRAMUSCULAR | Status: DC | PRN

## 2023-03-27 MED ORDER — SODIUM CHLORIDE 0.9 % IV SOLN: INTRAVENOUS | Status: DC

## 2023-03-27 MED ORDER — SODIUM CHLORIDE 0.9 % IV SOLN: INTRAVENOUS | Status: DC | PRN

## 2023-03-27 MED ORDER — ONDANSETRON HCL 4 MG/2ML IJ SOLN
INTRAMUSCULAR | Status: DC | PRN
  Administered 2023-03-27: 4 mg via INTRAVENOUS

## 2023-03-27 MED ORDER — PROPOFOL 10 MG/ML IV BOLUS
INTRAVENOUS | Status: DC | PRN
  Administered 2023-03-27: 130 mg via INTRAVENOUS

## 2023-03-27 MED ORDER — ACETAMINOPHEN 500 MG PO TABS
1000.0000 mg | ORAL_TABLET | Freq: Once | ORAL | Status: AC
  Administered 2023-03-27: 1000 mg via ORAL
  Filled 2023-03-27: qty 2

## 2023-03-27 MED ORDER — EPHEDRINE SULFATE-NACL 50-0.9 MG/10ML-% IV SOSY
PREFILLED_SYRINGE | INTRAVENOUS | Status: DC | PRN
  Administered 2023-03-27 (×2): 5 mg via INTRAVENOUS

## 2023-03-27 MED ORDER — DEXAMETHASONE SODIUM PHOSPHATE 10 MG/ML IJ SOLN
INTRAMUSCULAR | Status: DC | PRN
  Administered 2023-03-27: 4 mg via INTRAVENOUS

## 2023-03-27 MED ORDER — CHLORHEXIDINE GLUCONATE 0.12 % MT SOLN
OROMUCOSAL | Status: AC
  Administered 2023-03-27: 15 mL via OROMUCOSAL
  Filled 2023-03-27: qty 15

## 2023-03-27 NOTE — Op Note (Signed)
Video Bronchoscopy with Robotic Assisted Bronchoscopic Navigation   Date of Operation: 03/27/2023   Pre-op Diagnosis: Bilateral pulmonary nodules  Post-op Diagnosis: Same  Surgeon: Levy Pupa  Assistants: None  Anesthesia: General endotracheal anesthesia  Operation: Flexible video fiberoptic bronchoscopy with robotic assistance and biopsies.  Estimated Blood Loss: Minimal  Complications: None  Indications and History: Douglas Zhang is a 67 y.o. male with history of prostate cancer, tobacco use.  Found to have bilateral pulmonary nodules on a prostate-specific PET scan done 02/16/2023 after his PSA increased.  Recommendation made to achieve a tissue diagnosis via robotic assisted navigational bronchoscopy.  The risks, benefits, complications, treatment options and expected outcomes were discussed with the patient.  The possibilities of pneumothorax, pneumonia, reaction to medication, pulmonary aspiration, perforation of a viscus, bleeding, failure to diagnose a condition and creating a complication requiring transfusion or operation were discussed with the patient who freely signed the consent.    Description of Procedure: The patient was seen in the Preoperative Area, was examined and was deemed appropriate to proceed.  The patient was taken to [facility] endoscopy room 3, identified as Douglas Zhang and the procedure verified as Flexible Video Fiberoptic Bronchoscopy.  A Time Out was held and the above information confirmed.   Prior to the date of the procedure a high-resolution CT scan of the chest was performed. Utilizing ION software program a virtual tracheobronchial tree was generated to allow the creation of distinct navigation pathways to the patient's parenchymal abnormalities. After being taken to the operating room general anesthesia was initiated and the patient  was orally intubated. The video fiberoptic bronchoscope was introduced via the endotracheal tube and a general inspection was  performed which showed normal right and left lung anatomy. Aspiration of the bilateral mainstems was completed to remove any remaining secretions. Robotic catheter inserted into patient's endotracheal tube.   Target #1 left upper lobe lobulated pulmonary nodule: The distinct navigation pathways prepared prior to this procedure were then utilized to navigate to patient's lesion identified on CT scan. The robotic catheter was secured into place and the vision probe was withdrawn.  Lesion location was approximated using fluoroscopy and radial endobronchial ultrasound for peripheral targeting.  Local registration and targeting was performed using Cios three-dimensional imaging.  Under fluoroscopic guidance transbronchial needle biopsies were performed cytology.  The nodule was surrounded by vasculature and the needle samples were bloody.  These were pulled for cell block.  Target #2 left upper lobe anterior, peripheral pulmonary nodule: The distinct navigation pathways prepared prior to this procedure were then utilized to navigate to patient's lesion identified on CT scan. The robotic catheter was secured into place and the vision probe was withdrawn.  Lesion location was approximated using fluoroscopy and radial endobronchial ultrasound for peripheral targeting.  Local registration and targeting was performed using Cios three-dimensional imaging.  Under fluoroscopic guidance transbronchial brushings, transbronchial needle biopsies, and transbronchial forceps biopsies were performed to be sent for cytology and pathology.  Target #3 right upper lobe pulmonary nodule: The distinct navigation pathways prepared prior to this procedure were then utilized to navigate to patient's lesion identified on CT scan. The robotic catheter was secured into place and the vision probe was withdrawn.  Lesion location was approximated using fluoroscopy.  Local registration and targeting was performed using Cios three-dimensional  imaging.  Under fluoroscopic guidance transbronchial brushings, transbronchial needle biopsies were performed to be sent for cytology and pathology.   At the end of the procedure a general airway inspection was performed  and there was no evidence of active bleeding. The bronchoscope was removed.  The patient tolerated the procedure well. There was no significant blood loss and there were no obvious complications. A post-procedural chest x-ray is pending.  Samples Target #1: 1. Transbronchial Wang needle biopsies from lobulated left upper lobe pulmonary nodule  Samples Target #2: 1. Transbronchial brushings from peripheral left upper lobe pulmonary nodule 2. Transbronchial Wang needle biopsies from peripheral left upper lobe pulmonary nodule 3. Transbronchial forceps biopsies from peripheral left upper lobe pulmonary nodule  Samples Target #3: 1. Transbronchial brushings from right upper lobe nodule 2. Transbronchial Wang needle biopsies from right upper lobe nodule  Plans:  The patient will be discharged from the PACU to home when recovered from anesthesia and after chest x-ray is reviewed. We will review the cytology, pathology and microbiology results with the patient when they become available. Outpatient followup will be with Dr Delton Coombes and Dr Laverle Patter.    Levy Pupa, MD, PhD 03/27/2023, 12:24 PM [facility] [phone] or if no answer before 7:00PM call [phone] For any issues after 7:00PM please call eLink [phone]

## 2023-03-27 NOTE — Anesthesia Postprocedure Evaluation (Signed)
Anesthesia Post Note  Patient: Douglas Zhang  Procedure(s) Performed: ROBOTIC ASSISTED NAVIGATIONAL BRONCHOSCOPY VIDEO BRONCHOSCOPY WITH RADIAL ENDOBRONCHIAL ULTRASOUND BRONCHIAL NEEDLE ASPIRATION BIOPSIES BRONCHIAL BIOPSIES BRONCHIAL BRUSHINGS     Patient location during evaluation: PACU Anesthesia Type: General Level of consciousness: awake and alert Pain management: pain level controlled Vital Signs Assessment: post-procedure vital signs reviewed and stable Respiratory status: spontaneous breathing, nonlabored ventilation and respiratory function stable Cardiovascular status: blood pressure returned to baseline and stable Postop Assessment: no apparent nausea or vomiting Anesthetic complications: no  No notable events documented.  Last Vitals:  Vitals:   03/27/23 1245 03/27/23 1300  BP: 117/69 124/75  Pulse: 78 79  Resp: (!) 21 15  Temp:    SpO2: 100% 100%    Last Pain:  Vitals:   03/27/23 1230  TempSrc:   PainSc: 0-No pain                 Berlie Persky,W. EDMOND

## 2023-03-27 NOTE — Transfer of Care (Signed)
Immediate Anesthesia Transfer of Care Note  Patient: Douglas Zhang  Procedure(s) Performed: ROBOTIC ASSISTED NAVIGATIONAL BRONCHOSCOPY VIDEO BRONCHOSCOPY WITH RADIAL ENDOBRONCHIAL ULTRASOUND BRONCHIAL NEEDLE ASPIRATION BIOPSIES BRONCHIAL BIOPSIES BRONCHIAL BRUSHINGS  Patient Location: PACU  Anesthesia Type:General  Level of Consciousness: awake and alert   Airway & Oxygen Therapy: Patient Spontanous Breathing  Post-op Assessment: Report given to RN and Post -op Vital signs reviewed and stable  Post vital signs: Reviewed and stable  Last Vitals:  Vitals Value Taken Time  BP 129/74 03/27/23 1228  Temp    Pulse 95 03/27/23 1230  Resp 15 03/27/23 1230  SpO2 96 % 03/27/23 1230  Vitals shown include unfiled device data.  Last Pain:  Vitals:   03/27/23 0755  TempSrc:   PainSc: 0-No pain         Complications: No notable events documented.

## 2023-03-27 NOTE — Interval H&P Note (Signed)
History and Physical Interval Note:  03/27/2023 10:38 AM  Douglas Zhang  has presented today for surgery, with the diagnosis of BILATERAL PULMONARY NODULES.  The various methods of treatment have been discussed with the patient and family. After consideration of risks, benefits and other options for treatment, the patient has consented to  Procedure(s): ROBOTIC ASSISTED NAVIGATIONAL BRONCHOSCOPY (N/A) as a surgical intervention.  The patient's history has been reviewed, patient examined, no change in status, stable for surgery.  I have reviewed the patient's chart and labs.  Questions were answered to the patient's satisfaction.     Leslye Peer

## 2023-03-27 NOTE — Discharge Instructions (Signed)
Flexible Bronchoscopy, Care After This sheet gives you information about how to care for yourself after your test. Your doctor may also give you more specific instructions. If you have problems or questions, contact your doctor. Follow these instructions at home: Eating and drinking When your numbness is gone and your cough and gag reflexes have come back, you may: Eat only soft foods. Slowly drink liquids. The day after the test, go back to your normal diet. Driving Do not drive for 24 hours if you were given a medicine to help you relax (sedative). Do not drive or use heavy machinery while taking prescription pain medicine. General instructions  Take over-the-counter and prescription medicines only as told by your doctor. Return to your normal activities as told. Ask what activities are safe for you. Do not use any products that have nicotine or tobacco in them. This includes cigarettes and e-cigarettes. If you need help quitting, ask your doctor. Keep all follow-up visits as told by your doctor. This is important. It is very important if you had a tissue sample (biopsy) taken. Get help right away if: You have shortness of breath that gets worse. You get light-headed. You feel like you are going to pass out (faint). You have chest pain. You cough up: More than a little blood. More blood than before. Summary Do not eat or drink anything (not even water) for 2 hours after your test, or until your numbing medicine wears off. Do not use cigarettes. Do not use e-cigarettes. Get help right away if you have chest pain.  Please call our office for any questions or concerns.  [phone].  This information is not intended to replace advice given to you by your health care provider. Make sure you discuss any questions you have with your health care provider. Document Released: 03/19/2009 Document Revised: 05/04/2017 Document Reviewed: 06/09/2016 Elsevier Patient Education  2020 Elsevier  Inc.  

## 2023-03-27 NOTE — Anesthesia Preprocedure Evaluation (Addendum)
Anesthesia Evaluation  Patient identified by MRN, date of birth, ID band Patient awake    Reviewed: Allergy & Precautions, H&P , NPO status , Patient's Chart, lab work & pertinent test results  Airway Mallampati: II  TM Distance: >3 FB Neck ROM: Full    Dental no notable dental hx. (+) Edentulous Upper, Partial Lower, Dental Advisory Given   Pulmonary former smoker   Pulmonary exam normal breath sounds clear to auscultation       Cardiovascular hypertension, Pt. on medications  Rhythm:Regular Rate:Normal     Neuro/Psych  Headaches  negative psych ROS   GI/Hepatic Neg liver ROS,GERD  Medicated,,  Endo/Other  diabetes, Type 2, Oral Hypoglycemic Agents    Renal/GU negative Renal ROS  negative genitourinary   Musculoskeletal   Abdominal   Peds  Hematology negative hematology ROS (+)   Anesthesia Other Findings   Reproductive/Obstetrics negative OB ROS                             Anesthesia Physical Anesthesia Plan  ASA: 2  Anesthesia Plan: General   Post-op Pain Management: Tylenol PO (pre-op)*   Induction: Intravenous  PONV Risk Score and Plan: 3 and Ondansetron, Dexamethasone and Treatment may vary due to age or medical condition  Airway Management Planned: Oral ETT  Additional Equipment:   Intra-op Plan:   Post-operative Plan: Extubation in OR  Informed Consent: I have reviewed the patients History and Physical, chart, labs and discussed the procedure including the risks, benefits and alternatives for the proposed anesthesia with the patient or authorized representative who has indicated his/her understanding and acceptance.     Dental advisory given  Plan Discussed with: CRNA  Anesthesia Plan Comments:        Anesthesia Quick Evaluation

## 2023-03-27 NOTE — Anesthesia Procedure Notes (Signed)
Procedure Name: Intubation Date/Time: 03/27/2023 10:55 AM  Performed by: Sandie Ano, CRNAPre-anesthesia Checklist: Patient identified, Emergency Drugs available, Suction available and Patient being monitored Patient Re-evaluated:Patient Re-evaluated prior to induction Oxygen Delivery Method: Circle System Utilized Preoxygenation: Pre-oxygenation with 100% oxygen Induction Type: IV induction Ventilation: Mask ventilation without difficulty Laryngoscope Size: Mac and 3 Grade View: Grade I Tube type: Oral Tube size: 8.5 mm Number of attempts: 1 Airway Equipment and Method: Stylet and Oral airway Placement Confirmation: ETT inserted through vocal cords under direct vision, positive ETCO2 and breath sounds checked- equal and bilateral Secured at: 22 cm Tube secured with: Tape Dental Injury: Teeth and Oropharynx as per pre-operative assessment

## 2023-03-31 ENCOUNTER — Encounter (HOSPITAL_COMMUNITY): Payer: Self-pay | Admitting: Emergency Medicine

## 2023-04-02 LAB — CYTOLOGY - NON PAP

## 2023-04-03 ENCOUNTER — Ambulatory Visit: Payer: Medicare HMO | Admitting: Acute Care

## 2023-04-03 ENCOUNTER — Encounter: Payer: Self-pay | Admitting: Acute Care

## 2023-04-03 VITALS — BP 120/70 | HR 87 | Ht 69.0 in | Wt 170.0 lb

## 2023-04-03 DIAGNOSIS — Z8546 Personal history of malignant neoplasm of prostate: Secondary | ICD-10-CM | POA: Diagnosis not present

## 2023-04-03 DIAGNOSIS — Z9889 Other specified postprocedural states: Secondary | ICD-10-CM | POA: Diagnosis not present

## 2023-04-03 DIAGNOSIS — Z87891 Personal history of nicotine dependence: Secondary | ICD-10-CM

## 2023-04-03 DIAGNOSIS — C3490 Malignant neoplasm of unspecified part of unspecified bronchus or lung: Secondary | ICD-10-CM

## 2023-04-03 NOTE — Progress Notes (Unsigned)
History of Present Illness Douglas Zhang is a 67 y.o. male former smoker ( Quit 40 years ago) with ***  Synopsis Douglas Zhang is a 67 y.o. male with history of prostate cancer, tobacco use.  Found to have bilateral pulmonary nodules on a prostate-specific PET scan done 02/16/2023 after his PSA increased.  Recommendation made to achieve a tissue diagnosis via robotic assisted navigational bronchoscopy.    04/03/2023 Pt. Presents for follow up after Flexible video fiberoptic bronchoscopy with robotic assistance and biopsies on 03/27/2023. He states he has done well. No bleeding, shortness of breath, fever, or change in the color of secretions. He did fine after anesthesia.   Test Results: Cytology 03/27/2023 A. LUNG, LUL TARGET #1, NEEDLE ASPIRATION:  - Non-small cell carcinoma   Note: The overall specimen is quite hypocellular.  IHC on limited  material in cellblock was negative for TTF-1 and p40.  The clinical  history of remote prostate cancer is noted and an NKX3.1 will be  attempted on the limited cellblock and reported in an addendum.  Dr.  Venetia Night has peer reviewed the case and agrees with the  interpretation.   B. LUNG, LUL TARGET#2, CYTO BRUSHING:  - Suspicious   C. LUNG, LUL TARGET #2, FINE NEEDLE ASPIRATION  BIOPSY:  -Suspicious   Target 3 was insufficient for diagnosis     Latest Ref Rng & Units 03/27/2023    8:01 AM 09/24/2014    9:04 PM 06/11/2013    8:35 AM  CBC  WBC 4.0 - 10.5 K/uL 5.0  11.1  6.2   Hemoglobin 13.0 - 17.0 g/dL 54.0  98.1  19.1   Hematocrit 39.0 - 52.0 % 40.3  39.8  42.8   Platelets 150 - 400 K/uL 144  168  169        Latest Ref Rng & Units 03/27/2023    8:01 AM 09/24/2014    9:04 PM 06/11/2013    8:35 AM  BMP  Glucose 70 - 99 mg/dL 478  295  621   BUN 8 - 23 mg/dL 29  35  14   Creatinine 0.61 - 1.24 mg/dL 3.08  6.57  8.46   Sodium 135 - 145 mmol/L 143  132  139   Potassium 3.5 - 5.1 mmol/L 4.1  4.6  4.4   Chloride 98 - 111 mmol/L 105   97  101   CO2 22 - 32 mmol/L 23  24  23    Calcium 8.9 - 10.3 mg/dL 96.2  9.8  95.2     BNP No results found for: "BNP"  ProBNP No results found for: "PROBNP"  PFT No results found for: "FEV1PRE", "FEV1POST", "FVCPRE", "FVCPOST", "TLC", "DLCOUNC", "PREFEV1FVCRT", "PSTFEV1FVCRT"  DG Chest Port 1 View  Result Date: 03/27/2023 CLINICAL DATA:  Status post bronchoscopy with biopsy. EXAM: PORTABLE CHEST 1 VIEW COMPARISON:  June 11, 2013. FINDINGS: The heart size and mediastinal contours are within normal limits. Both lungs are clear. The visualized skeletal structures are unremarkable. IMPRESSION: No active disease. Electronically Signed   By: Lupita Raider M.D.   On: 03/27/2023 15:11   DG C-ARM BRONCHOSCOPY  Result Date: 03/27/2023 C-ARM BRONCHOSCOPY: Fluoroscopy was utilized by the requesting physician.  No radiographic interpretation.   DG C-Arm 1-60 Min-No Report  Result Date: 03/27/2023 Fluoroscopy was utilized by the requesting physician.  No radiographic interpretation.   CT SUPER D CHEST WO CONTRAST  Result Date: 03/27/2023 CLINICAL DATA:  Prostate cancer with pulmonary nodules on PET. Surgical planning. *  Tracking Code: BO * EXAM: CT CHEST WITHOUT CONTRAST TECHNIQUE: Multidetector CT imaging of the chest was performed using thin slice collimation for electromagnetic bronchoscopy planning purposes, without intravenous contrast. RADIATION DOSE REDUCTION: This exam was performed according to the departmental dose-optimization program which includes automated exposure control, adjustment of the mA and/or kV according to patient size and/or use of iterative reconstruction technique. COMPARISON:  PET 02/16/2023.  No prior diagnostic chest CT. FINDINGS: Cardiovascular: Bovine arch. Aortic atherosclerosis. Upper normal ascending aortic caliber at 3.9 cm. Normal heart size, without pericardial effusion. Lad and left circumflex coronary artery calcification. Mediastinum/Nodes: No  supraclavicular adenopathy. No mediastinal or hilar adenopathy. Lungs/Pleura:  No pleural fluid. Left apical pleuroparenchymal scarring. Right apical nodule versus area of nodular scarring at 7 mm on 20/4. Left upper lobe macrolobulated, somewhat branching nodule measures on the order of 1.5 x 1.7 cm on 58/4, relatively similar to the prior PET. Peripheral irregular anterior left upper lobe/lingular nodule measures 10 x 10 mm on 81/4 and is similar to the prior. Upper Abdomen: Cholecystectomy. Normal imaged portions of the liver, spleen, stomach, right adrenal gland, left kidney. An incompletely imaged upper pole right renal 7.1 cm fluid density lesion is likely a cyst . In the absence of clinically indicated signs/symptoms require(s) no independent follow-up. 1.2 cm left adrenal nodule measures 38 HU and was not tracer avid on prior PET. This has been present back to 2014 per report and can be presumed an adenoma. The common duct measures 1.8 cm on 160/3 and is similar back to 10/26/2016. Musculoskeletal: Moderate to marked T11 compression deformity is new since 12/28/2021 CT. IMPRESSION: 1. Dominant left upper lobe pulmonary nodules as detailed above. The more cephalad is branching and could represent an endobronchial metastasis or an area of mucoid impaction. The more caudal is irregular/rounded and most likely metastasis or metachronous lung primary. 2. New T11 compression deformity since 2023. 3. Incidental findings, including: Coronary artery atherosclerosis. Aortic Atherosclerosis (ICD10-I70.0). 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 Electronically Signed   By: Jeronimo Greaves M.D.   On: 03/27/2023 10:47     Past medical hx Past Medical History:  Diagnosis Date   AP (acute pancreatitis)    due to gallstones, history   Cataract    no surgery, just forming   COVID-19    Diabetes mellitus without complication (HCC)    ED (erectile dysfunction)    GERD (gastroesophageal reflux disease)     Headache(784.0)    migraines   Hx of migraines    Hyperlipidemia    Hypertension    Prostate cancer (HCC) 01/09/2011   biopsy-gleason 4+4=8   Radiation 07/03/2012-08/21/2012   prostate 6600 cGy 33 sessions   Stress incontinence, male      Social History   Tobacco Use   Smoking status: Former    Current packs/day: 0.00    Average packs/day: 1 pack/day for 5.0 years (5.0 ttl pk-yrs)    Types: Cigarettes    Start date: 01/30/1978    Quit date: 01/31/1983    Years since quitting: 40.1   Smokeless tobacco: Never  Substance Use Topics   Alcohol use: No   Drug use: No    Douglas Zhang reports that he quit smoking about 40 years ago. His smoking use included cigarettes. He started smoking about 45 years ago. He has a 5 pack-year smoking history. He has never used smokeless tobacco. He reports that he does not drink alcohol and does not use drugs.  Tobacco Cessation: Counseling given: Not  Answered   Past surgical hx, Family hx, Social hx all reviewed.  Current Outpatient Medications on File Prior to Visit  Medication Sig   aspirin EC 81 MG tablet Take by mouth.   atorvastatin (LIPITOR) 40 MG tablet Take by mouth.   calcium citrate-vitamin D (CITRACAL+D) 315-200 MG-UNIT per tablet Take 1 tablet by mouth 2 (two) times daily.   cetirizine (ZYRTEC) 10 MG tablet Take 10 mg by mouth daily.   cyanocobalamin ([employer] VITAMIN B12) 1000 MCG tablet Take 1 tablet by mouth daily.   glipiZIDE (GLUCOTROL XL) 10 MG 24 hr tablet Take 10 mg by mouth daily.   hydrochlorothiazide (HYDRODIURIL) 25 MG tablet Take 25 mg by mouth daily.    HYDROcodone-acetaminophen (NORCO/VICODIN) 5-325 MG per tablet Take 1 tablet by mouth every 6 (six) hours as needed for severe pain.   JANUVIA 100 MG tablet Take 100 mg by mouth daily.   lisinopril (PRINIVIL,ZESTRIL) 20 MG tablet Take 20 mg by mouth daily with breakfast.    magnesium oxide (MAG-OX) 400 MG tablet Take by mouth.   metFORMIN (GLUCOPHAGE-XR) 500 MG 24 hr tablet  Take 1,000 mg by mouth 2 (two) times daily.    Multiple Vitamins-Minerals (CENTRUM SILVER 50+MEN) TABS Take 1 tablet by mouth daily.   MYRBETRIQ 50 MG TB24 tablet Take 50 mg by mouth daily.    omeprazole (PRILOSEC) 20 MG capsule Take 20 mg by mouth daily as needed (indigestion).    OZEMPIC, 0.25 OR 0.5 MG/DOSE, 2 MG/3ML SOPN INJECT 1/2 (ONE-HALF) MG SUBCUTANEOUSLY ONCE A WEEK   SUMAtriptan (IMITREX) 100 MG tablet Take 100 mg by mouth every 2 (two) hours as needed for migraine (migraine).    valsartan (DIOVAN) 80 MG tablet Take by mouth.   No current facility-administered medications on file prior to visit.     No Known Allergies  Review Of Systems:  Constitutional:   No  weight loss, night sweats,  Fevers, chills, fatigue, or  lassitude.  HEENT:   No headaches,  Difficulty swallowing,  Tooth/dental problems, or  Sore throat,                No sneezing, itching, ear ache, nasal congestion, post nasal drip,   CV:  No chest pain,  Orthopnea, PND, swelling in lower extremities, anasarca, dizziness, palpitations, syncope.   GI  No heartburn, indigestion, abdominal pain, nausea, vomiting, diarrhea, change in bowel habits, loss of appetite, bloody stools.   Resp: No shortness of breath with exertion or at rest.  No excess mucus, no productive cough,  No non-productive cough,  No coughing up of blood.  No change in color of mucus.  No wheezing.  No chest wall deformity  Skin: no rash or lesions.  GU: no dysuria, change in color of urine, no urgency or frequency.  No flank pain, no hematuria   MS:  No joint pain or swelling.  No decreased range of motion.  No back pain.  Psych:  No change in mood or affect. No depression or anxiety.  No memory loss.   Vital Signs BP 120/70 (BP Location: Left Arm, Cuff Size: Large)   Pulse 87   Ht 5\' 9"  (1.753 m)   Wt 170 lb (77.1 kg)   SpO2 96%   BMI 25.10 kg/m    Physical Exam:  General- No distress,  A&Ox3 ENT: No sinus tenderness, TM clear,  pale nasal mucosa, no oral exudate,no post nasal drip, no LAN Cardiac: S1, S2, regular rate and rhythm, no murmur Chest:  No wheeze/ rales/ dullness; no accessory muscle use, no nasal flaring, no sternal retractions Abd.: Soft Non-tender Ext: No clubbing cyanosis, edema Neuro:  normal strength Skin: No rashes, warm and dry Psych: normal mood and behavior   Assessment/Plan  No problem-specific Assessment & Plan notes found for this encounter.    Bevelyn Ngo, NP 04/03/2023  3:12 PM

## 2023-04-03 NOTE — Patient Instructions (Addendum)
It is good to see you today. I am glad you have done well after your procedure.  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 I have placed referral to Medical oncology, Radiation oncology and Thoracic Surgery. You will get calls to get these consults scheduled.  I have placed an order for PFT's , and we have you scheduled for 04/06/2023 at 2:30 pm. You will get great care through the cancer center. Call if you need [place] for anything Please contact office for sooner follow up if symptoms do not improve or worsen or seek emergency care

## 2023-04-04 ENCOUNTER — Encounter: Payer: Self-pay | Admitting: Acute Care

## 2023-04-04 NOTE — Progress Notes (Signed)
Thoracic Location of Tumor / Histology: Left Upper Lobe Lung Ca   03/27/2023 Dr. Levy Pupa CT Super D Chest without Contrast CLINICAL DATA:  Prostate cancer with pulmonary nodules on PET.  Surgical planning. * Tracking Code: BO *  IMPRESSION: 1. Dominant left upper lobe pulmonary nodules as detailed above. The more cephalad is branching and could represent an endobronchial metastasis or an area of mucoid impaction. The more caudal is irregular/rounded and most likely metastasis or metachronous lung primary. 2. New T11 compression deformity since 2023. 3. Incidental findings, including: Coronary artery atherosclerosis. Aortic Atherosclerosis (ICD10-I70.0). 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  02/16/2023 Dr.  Heloise Purpura NM PET (PSMA) Skull to Mid Thigh CLINICAL DATA:  Prostate carcinoma with biochemical recurrence.  Prior radiation treatment. PSA equal 5.06  IMPRESSION: 1. Three radiotracer avid pulmonary nodules most consistent with prostate cancer pulmonary metastasis. 2. No evidence of local prostate carcinoma recurrence in the prostate fossa. 3. No evidence of metastatic adenopathy in the pelvis or periaortic retroperitoneum. 4. No evidence of skeletal metastasis.  Past/Anticipated interventions by cardiothoracic surgery, if any:   03/27/2023 Dr. Levy Pupa Robotic Assisted Bronchoscopy Navigation  Past/Anticipated interventions by medical oncology, if any: NA  Tobacco/Marijuana/Snuff/ETOH use: Former smoker, no snuff, drug, or alcohol use.  Signs/Symptoms Weight changes, if any: No Respiratory complaints, if any: No Hemoptysis, if any: No Pain issues, if any:  0/10  SAFETY ISSUES: Prior radiation?  Yes,  (prostate 2013) Pacemaker/ICD?  No Possible current pregnancy?  Male Is the patient on methotrexate? No  Current Complaints / other details:   No

## 2023-04-05 ENCOUNTER — Other Ambulatory Visit: Payer: Self-pay

## 2023-04-06 ENCOUNTER — Ambulatory Visit (HOSPITAL_BASED_OUTPATIENT_CLINIC_OR_DEPARTMENT_OTHER): Payer: Medicare HMO | Admitting: Emergency Medicine

## 2023-04-06 DIAGNOSIS — C3490 Malignant neoplasm of unspecified part of unspecified bronchus or lung: Secondary | ICD-10-CM | POA: Diagnosis not present

## 2023-04-06 LAB — PULMONARY FUNCTION TEST
DL/VA % pred: 101 %
DL/VA: 4.16 ml/min/mmHg/L
DLCO unc % pred: 103 %
DLCO unc: 26.41 ml/min/mmHg
FEF 25-75 Post: 3.91 L/s
FEF 25-75 Pre: 3.55 L/s
FEF2575-%Change-Post: 9 %
FEF2575-%Pred-Post: 157 %
FEF2575-%Pred-Pre: 143 %
FEV1-%Change-Post: 1 %
FEV1-%Pred-Post: 114 %
FEV1-%Pred-Pre: 112 %
FEV1-Post: 3.67 L
FEV1-Pre: 3.61 L
FEV1FVC-%Change-Post: 2 %
FEV1FVC-%Pred-Pre: 107 %
FEV6-%Change-Post: -1 %
FEV6-%Pred-Post: 109 %
FEV6-%Pred-Pre: 110 %
FEV6-Post: 4.48 L
FEV6-Pre: 4.53 L
FEV6FVC-%Change-Post: 0 %
FEV6FVC-%Pred-Post: 105 %
FEV6FVC-%Pred-Pre: 105 %
FVC-%Change-Post: -1 %
FVC-%Pred-Post: 103 %
FVC-%Pred-Pre: 104 %
FVC-Post: 4.5 L
FVC-Pre: 4.55 L
Post FEV1/FVC ratio: 81 %
Post FEV6/FVC ratio: 100 %
Pre FEV1/FVC ratio: 79 %
Pre FEV6/FVC Ratio: 100 %
RV % pred: 112 %
RV: 2.65 L
TLC % pred: 111 %
TLC: 7.6 L

## 2023-04-06 NOTE — Patient Instructions (Signed)
Full PFT Performed Today  

## 2023-04-06 NOTE — Progress Notes (Signed)
Full PFT Performed Today  

## 2023-04-06 NOTE — Progress Notes (Signed)
The proposed treatment discussed in conference is for discussion purpose only and is not a binding recommendation.  The patients have not been physically examined, or presented with their treatment options.  Therefore, final treatment plans cannot be decided.  

## 2023-04-09 ENCOUNTER — Ambulatory Visit
Admission: RE | Admit: 2023-04-09 | Discharge: 2023-04-09 | Disposition: A | Discharge status: Home / Self Care | Payer: Medicare HMO | Source: Ambulatory Visit | Attending: Radiation Oncology

## 2023-04-09 ENCOUNTER — Telehealth: Payer: Self-pay

## 2023-04-09 ENCOUNTER — Encounter: Payer: Self-pay | Admitting: Radiation Oncology

## 2023-04-09 ENCOUNTER — Ambulatory Visit
Admission: RE | Admit: 2023-04-09 | Discharge: 2023-04-09 | Disposition: A | Discharge status: Home / Self Care | Payer: Medicare HMO | Source: Ambulatory Visit | Attending: Radiation Oncology | Admitting: Radiation Oncology

## 2023-04-09 VITALS — BP 136/77 | HR 96 | Temp 97.6°F | Resp 20 | Ht 69.0 in | Wt 170.6 lb

## 2023-04-09 DIAGNOSIS — E785 Hyperlipidemia, unspecified: Secondary | ICD-10-CM | POA: Insufficient documentation

## 2023-04-09 DIAGNOSIS — C61 Malignant neoplasm of prostate: Secondary | ICD-10-CM | POA: Insufficient documentation

## 2023-04-09 DIAGNOSIS — E1136 Type 2 diabetes mellitus with diabetic cataract: Secondary | ICD-10-CM | POA: Insufficient documentation

## 2023-04-09 DIAGNOSIS — N393 Stress incontinence (female) (male): Secondary | ICD-10-CM | POA: Insufficient documentation

## 2023-04-09 DIAGNOSIS — N529 Male erectile dysfunction, unspecified: Secondary | ICD-10-CM | POA: Insufficient documentation

## 2023-04-09 DIAGNOSIS — I2510 Atherosclerotic heart disease of native coronary artery without angina pectoris: Secondary | ICD-10-CM | POA: Insufficient documentation

## 2023-04-09 DIAGNOSIS — Z7985 Long-term (current) use of injectable non-insulin antidiabetic drugs: Secondary | ICD-10-CM | POA: Insufficient documentation

## 2023-04-09 DIAGNOSIS — C7951 Secondary malignant neoplasm of bone: Secondary | ICD-10-CM | POA: Insufficient documentation

## 2023-04-09 DIAGNOSIS — I700 Atherosclerosis of aorta: Secondary | ICD-10-CM | POA: Diagnosis not present

## 2023-04-09 DIAGNOSIS — Z800 Family history of malignant neoplasm of digestive organs: Secondary | ICD-10-CM | POA: Diagnosis not present

## 2023-04-09 DIAGNOSIS — K838 Other specified diseases of biliary tract: Secondary | ICD-10-CM | POA: Insufficient documentation

## 2023-04-09 DIAGNOSIS — C7800 Secondary malignant neoplasm of unspecified lung: Secondary | ICD-10-CM | POA: Insufficient documentation

## 2023-04-09 DIAGNOSIS — Z79899 Other long term (current) drug therapy: Secondary | ICD-10-CM | POA: Insufficient documentation

## 2023-04-09 DIAGNOSIS — I10 Essential (primary) hypertension: Secondary | ICD-10-CM | POA: Diagnosis not present

## 2023-04-09 DIAGNOSIS — Z923 Personal history of irradiation: Secondary | ICD-10-CM | POA: Diagnosis not present

## 2023-04-09 DIAGNOSIS — C7801 Secondary malignant neoplasm of right lung: Secondary | ICD-10-CM | POA: Diagnosis present

## 2023-04-09 DIAGNOSIS — K219 Gastro-esophageal reflux disease without esophagitis: Secondary | ICD-10-CM | POA: Insufficient documentation

## 2023-04-09 DIAGNOSIS — Z7984 Long term (current) use of oral hypoglycemic drugs: Secondary | ICD-10-CM | POA: Diagnosis not present

## 2023-04-09 DIAGNOSIS — C3412 Malignant neoplasm of upper lobe, left bronchus or lung: Secondary | ICD-10-CM

## 2023-04-09 DIAGNOSIS — D3502 Benign neoplasm of left adrenal gland: Secondary | ICD-10-CM | POA: Insufficient documentation

## 2023-04-09 DIAGNOSIS — C7802 Secondary malignant neoplasm of left lung: Secondary | ICD-10-CM | POA: Insufficient documentation

## 2023-04-09 DIAGNOSIS — Z87891 Personal history of nicotine dependence: Secondary | ICD-10-CM | POA: Diagnosis not present

## 2023-04-09 NOTE — Progress Notes (Signed)
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   The primary encounter diagnosis was Malignant neoplasm metastatic to both lungs (HCC). A diagnosis of Prostate cancer [facility]) was also pertinent to this visit.    ICD-10-CM   1. Malignant neoplasm metastatic to both lungs (HCC)  C78.01    C78.02     2. Prostate cancer (HCC)  C61       HISTORY OF PRESENT ILLNESS::Douglas Zhang is a 67 y.o. male with a past medical history significant for prostate cancer and tobacco use who is being seen at the request of Dr. Delton Coombes. Patient underwent a prostatectomy with Dr. Laverle Patter in 2012 for his pT3a prostate cancer. This was followed by salvage radiation therapy with Dr. Dayton Scrape in 2014. Since then, patient has continued follow-up under the care of Dr. Laverle Patter.  His most recent PSA levels are as follows:   Given his elevated PSA levels, Dr. Laverle Patter recommended further imaging. PSMA PET on 02/16/2023 revealed three radiotracer avid pulmonary nodules, concerning for metastatic disease. No evidence of local prostate recurrence or adenopathy was was visualized. Patient underwent bronchoscopy and biopsy under the care of Dr. Delton Coombes on 03/27/2023. 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 The cellblock was negative for TTF-1 and p40. Pathology from the LUL, target #2 was suspicious but ultimately inconclusive, and from the RUL was insufficient for diagnosis.   He was kindly referred today to discuss radiation therapy options.   PREVIOUS RADIATION THERAPY: Yes   01/2Patient received 66Gy in 33 fractions to the prostate bed  Past Medical History:  Diagnosis  Date   AP (acute pancreatitis)    due to gallstones, history   Cataract    no surgery, just forming   COVID-19    Diabetes mellitus without complication [facility])    ED (erectile dysfunction)    GERD (gastroesophageal reflux disease)    Headache(784.0)    migraines   Hx of migraines    Hyperlipidemia    Hypertension    Prostate cancer (HCC) 01/09/2011   biopsy-gleason 4+4=8   Radiation 07/03/2012-08/21/2012   prostate 6600 cGy 33 sessions   Stress incontinence, male   :   Past Surgical History:  Procedure Laterality Date   BRONCHIAL BIOPSY  03/27/2023   Procedure: BRONCHIAL BIOPSIES;  Surgeon: Leslye Peer, MD;  Location: [facility];  Service: Pulmonary;;   BRONCHIAL BRUSHINGS  03/27/2023   Procedure: BRONCHIAL BRUSHINGS;  Surgeon: Leslye Peer, MD;  Location: [facility];  Service: Pulmonary;;   BRONCHIAL NEEDLE ASPIRATION BIOPSY  03/27/2023   Procedure: BRONCHIAL NEEDLE ASPIRATION BIOPSIES;  Surgeon: Leslye Peer, MD;  Location: [facility];  Service: Pulmonary;;   CHOLECYSTECTOMY  1990   CHOLECYSTECTOMY, LAPAROSCOPIC     CYSTOSCOPY WITH BIOPSY N/A 06/12/2013   Procedure: CYSTOSCOPY WITH BIOPSY bladder washing;  Surgeon: Crecencio Mc, MD;  Location: [facility];  Service: Urology;  Laterality: N/A;  BLADDER BIOPSY CYTOLOGY EXAM UNDER ANESTHESIA     INGUINAL HERNIA REPAIR     bi-lateral   PROSTATE SURGERY  03/02/11   gleason 4+4=8   VIDEO BRONCHOSCOPY WITH RADIAL ENDOBRONCHIAL ULTRASOUND  03/27/2023   Procedure: VIDEO BRONCHOSCOPY WITH RADIAL ENDOBRONCHIAL ULTRASOUND;  Surgeon:  Leslye Peer, MD;  Location: [facility] ENDOSCOPY;  Service: Pulmonary;;  :   Current Outpatient Medications:    ACCU-CHEK GUIDE test strip, USE 1  ONCE DAILY, Disp: , Rfl:    Blood Glucose Monitoring Suppl (ACCU-CHEK GUIDE) w/Device KIT, Use to check BS daily, Disp: , Rfl:    lidocaine (LIDODERM) 5 %, Apply patch to painful area. Patch may remain in place for up to 12 hours in a 24 hour period.,  Disp: , Rfl:    Microlet Lancets MISC, USE 1  TO CHECK GLUCOSE ONCE DAILY TO  CHECK  GLUCOSE, Disp: , Rfl:    triamcinolone ointment (KENALOG) 0.1 %, APPLY  OINTMENT TOPICALLY TWICE DAILY, Disp: , Rfl:    aspirin EC 81 MG tablet, Take by mouth., Disp: , Rfl:    atorvastatin (LIPITOR) 40 MG tablet, Take by mouth., Disp: , Rfl:    calcium citrate-vitamin D (CITRACAL+D) 315-200 MG-UNIT per tablet, Take 1 tablet by mouth 2 (two) times daily., Disp: , Rfl:    cetirizine (ZYRTEC) 10 MG tablet, Take 10 mg by mouth daily., Disp: , Rfl:    cyanocobalamin ([employer] VITAMIN B12) 1000 MCG tablet, Take 1 tablet by mouth daily., Disp: , Rfl:    glipiZIDE (GLUCOTROL XL) 10 MG 24 hr tablet, Take 10 mg by mouth daily., Disp: , Rfl:    hydrochlorothiazide (HYDRODIURIL) 25 MG tablet, Take 25 mg by mouth daily. , Disp: , Rfl:    HYDROcodone-acetaminophen (NORCO/VICODIN) 5-325 MG per tablet, Take 1 tablet by mouth every 6 (six) hours as needed for severe pain., Disp: 15 tablet, Rfl: 0   JANUVIA 100 MG tablet, Take 100 mg by mouth daily., Disp: , Rfl:    lisinopril (PRINIVIL,ZESTRIL) 20 MG tablet, Take 20 mg by mouth daily with breakfast. , Disp: , Rfl:    magnesium oxide (MAG-OX) 400 MG tablet, Take by mouth., Disp: , Rfl:    metFORMIN (GLUCOPHAGE-XR) 500 MG 24 hr tablet, Take 1,000 mg by mouth 2 (two) times daily. , Disp: , Rfl:    montelukast (SINGULAIR) 10 MG tablet, Take 10 mg by mouth at bedtime., Disp: , Rfl:    Multiple Vitamins-Minerals (CENTRUM SILVER 50+MEN) TABS, Take 1 tablet by mouth daily., Disp: , Rfl:    MYRBETRIQ 50 MG TB24 tablet, Take 50 mg by mouth daily. , Disp: , Rfl:    omeprazole (PRILOSEC) 20 MG capsule, Take 20 mg by mouth daily as needed (indigestion). , Disp: , Rfl:    OZEMPIC, 0.25 OR 0.5 MG/DOSE, 2 MG/3ML SOPN, INJECT 1/2 (ONE-HALF) MG SUBCUTANEOUSLY ONCE A WEEK, Disp: , Rfl:    SUMAtriptan (IMITREX) 100 MG tablet, Take 100 mg by mouth every 2 (two) hours as needed for migraine (migraine). ,  Disp: , Rfl:    valsartan (DIOVAN) 80 MG tablet, Take by mouth., Disp: , Rfl: :  No Known Allergies:   Family History  Problem Relation Age of Onset   Stroke Father    Cancer Maternal Grandmother        colon cancer  :   Social History   Socioeconomic History   Marital status: Legally Separated    Spouse name: Not on file   Number of children: Not on file   Years of education: Not on file   Highest education level: Not on file  Occupational History    Employer: [employer]    Comment: tractor trailer  driver long distance  Tobacco Use   Smoking status: Former    Current packs/day:  0.00    Average packs/day: 1 pack/day for 5.0 years (5.0 ttl pk-yrs)    Types: Cigarettes    Start date: 01/30/1978    Quit date: 01/31/1983    Years since quitting: 40.2   Smokeless tobacco: Never  Vaping Use   Vaping status: Never Used  Substance and Sexual Activity   Alcohol use: No   Drug use: No   Sexual activity: Never  Other Topics Concern   Not on file  Social History Narrative   Not on file   Social Determinants of Health   Financial Resource Strain: Low Risk  (05/03/2022)   Received from [facility]   Overall Financial Resource Strain (CARDIA)    Difficulty of Paying Living Expenses: Not hard at all  Food Insecurity: No Food Insecurity (04/09/2023)   Hunger Vital Sign    Worried About Running Out of Food in the Last Year: Never true    Ran Out of Food in the Last Year: Never true  Transportation Needs: No Transportation Needs (04/09/2023)   PRAPARE - Administrator, Civil Service (Medical): No    Lack of Transportation (Non-Medical): No  Physical Activity: Insufficiently Active (05/03/2022)   Received from [facility]   Exercise Vital Sign    Days of Exercise per Week: 7 days    Minutes of Exercise per Session: 20 min  Stress: No Stress Concern Present (11/15/2022)   Received from [facility] of Occupational Health - Occupational Stress  Questionnaire    Feeling of Stress : Only a little  Social Connections: Socially Integrated (05/03/2022)   Received from [facility]   Social Network    How would you rate your social network (family, work, friends)?: Good participation with social networks  Intimate Partner Violence: Not At Risk (04/09/2023)   Humiliation, Afraid, Rape, and Kick questionnaire    Fear of Current or Ex-Partner: No    Emotionally Abused: No    Physically Abused: No    Sexually Abused: No  :  REVIEW OF SYSTEMS:  Patient denies any cough, shortness of breath, chest pain, or hemoptysis or any other changes in his breathing. He endorses some fatigue, but denies any changes in his weight, night sweats, or fevers.   PHYSICAL EXAM:  Blood pressure 136/77, pulse 96, temperature 97.6 F (36.4 C), resp. rate 20, height 5\' 9"  (1.753 m), weight 170 lb 9.6 oz (77.4 kg), SpO2 98%. In general this is a well appearing male in no acute distress. He's alert and oriented x4 and appropriate throughout the examination. Cardiopulmonary assessment is negative for acute distress and he exhibits normal effort.      KPS = 100  100 - Normal; no complaints; no evidence of disease. 90   - Able to carry on normal activity; minor signs or symptoms of disease. 80   - Normal activity with effort; some signs or symptoms of disease. 66   - Cares for self; unable to carry on normal activity or to do active work. 60   - Requires occasional assistance, but is able to care for most of his personal needs. 50   - Requires considerable assistance and frequent medical care. 40   - Disabled; requires special care and assistance. 30   - Severely disabled; hospital admission is indicated although death not imminent. 20   - Very sick; hospital admission necessary; active supportive treatment necessary. 10   - Moribund; fatal processes progressing rapidly. 0     -  Dead  Karnofsky DA, Abelmann WH, Craver LS and Burchenal [facility] [phone]) The use of the  nitrogen mustards in the palliative treatment of carcinoma: with particular reference to bronchogenic carcinoma Cancer 1 634-56  LABORATORY DATA:  Lab Results  Component Value Date   WBC 5.0 03/27/2023   HGB 13.0 03/27/2023   HCT 40.3 03/27/2023   MCV 87.2 03/27/2023   PLT 144 (L) 03/27/2023   Lab Results  Component Value Date   NA 143 03/27/2023   K 4.1 03/27/2023   CL 105 03/27/2023   CO2 23 03/27/2023   Lab Results  Component Value Date   ALT 29 09/24/2014   AST 28 09/24/2014   ALKPHOS 52 09/24/2014   BILITOT 0.4 09/24/2014     RADIOGRAPHY: DG Chest Port 1 View  Result Date: 03/27/2023 CLINICAL DATA:  Status post bronchoscopy with biopsy. EXAM: PORTABLE CHEST 1 VIEW COMPARISON:  June 11, 2013. FINDINGS: The heart size and mediastinal contours are within normal limits. Both lungs are clear. The visualized skeletal structures are unremarkable. IMPRESSION: No active disease. Electronically Signed   By: Lupita Raider M.D.   On: 03/27/2023 15:11   DG C-ARM BRONCHOSCOPY  Result Date: 03/27/2023 C-ARM BRONCHOSCOPY: Fluoroscopy was utilized by the requesting physician.  No radiographic interpretation.   DG C-Arm 1-60 Min-No Report  Result Date: 03/27/2023 Fluoroscopy was utilized by the requesting physician.  No radiographic interpretation.   CT SUPER D CHEST WO CONTRAST  Result Date: 03/27/2023 CLINICAL DATA:  Prostate cancer with pulmonary nodules on PET. Surgical planning. * Tracking Code: BO * EXAM: CT CHEST WITHOUT CONTRAST TECHNIQUE: Multidetector CT imaging of the chest was performed using thin slice collimation for electromagnetic bronchoscopy planning purposes, without intravenous contrast. RADIATION DOSE REDUCTION: This exam was performed according to the departmental dose-optimization program which includes automated exposure control, adjustment of the mA and/or kV according to patient size and/or use of iterative reconstruction technique. COMPARISON:  PET  02/16/2023.  No prior diagnostic chest CT. FINDINGS: Cardiovascular: Bovine arch. Aortic atherosclerosis. Upper normal ascending aortic caliber at 3.9 cm. Normal heart size, without pericardial effusion. Lad and left circumflex coronary artery calcification. Mediastinum/Nodes: No supraclavicular adenopathy. No mediastinal or hilar adenopathy. Lungs/Pleura:  No pleural fluid. Left apical pleuroparenchymal scarring. Right apical nodule versus area of nodular scarring at 7 mm on 20/4. Left upper lobe macrolobulated, somewhat branching nodule measures on the order of 1.5 x 1.7 cm on 58/4, relatively similar to the prior PET. Peripheral irregular anterior left upper lobe/lingular nodule measures 10 x 10 mm on 81/4 and is similar to the prior. Upper Abdomen: Cholecystectomy. Normal imaged portions of the liver, spleen, stomach, right adrenal gland, left kidney. An incompletely imaged upper pole right renal 7.1 cm fluid density lesion is likely a cyst . In the absence of clinically indicated signs/symptoms require(s) no independent follow-up. 1.2 cm left adrenal nodule measures 38 HU and was not tracer avid on prior PET. This has been present back to 2014 per report and can be presumed an adenoma. The common duct measures 1.8 cm on 160/3 and is similar back to 10/26/2016. Musculoskeletal: Moderate to marked T11 compression deformity is new since 12/28/2021 CT. IMPRESSION: 1. Dominant left upper lobe pulmonary nodules as detailed above. The more cephalad is branching and could represent an endobronchial metastasis or an area of mucoid impaction. The more caudal is irregular/rounded and most likely metastasis or metachronous lung primary. 2. New T11 compression deformity since 2023. 3. Incidental findings, including: Coronary artery atherosclerosis.  Aortic Atherosclerosis (ICD10-I70.0). 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 Electronically Signed   By: Jeronimo Greaves M.D.   On: 03/27/2023 10:47       IMPRESSION: 67 yo gentleman with bilateral lung metastases from prostate cancer primary; s/p prostatectomy in 2012 and salvage radiation therapy in 2014.   We personally reviewed most recent imaging with the patient. His case was discussed at our lung tumor board and the consensus was to evaluate whether he is a candidate for surgery; if not, then radiation should be considered. If he is not suitable for surgery, he would be a good candidate for radiation to address all three lung nodules. Regardless of the biopsy results, treatment to all three nodules is recommended due to the high likelihood of prostate cancer.  Today, we talked to the patient and family about the findings and work-up thus far.  We discussed the natural history of metastatic prostate cancer to the lungs and general treatment, highlighting the role of radiotherapy in the management.  We discussed the available radiation techniques, and focused on the details of logistics and delivery.  We reviewed the anticipated acute and late sequelae associated with radiation in this setting.  The patient was encouraged to ask questions that I answered to the best of my ability. Patient expressed understanding of the treatment and is interested in proceeding, if not a surgical candidate.  The patient is a candidate for SBRT for the three lung nodules.   PLAN: Patient is scheduled to meet with medical oncology on 04/16/2023 and Dr. Dorris Fetch on 04/17/2023. We will wait until he has consulted with surgery prior to making a final decision regarding radiation treatment. We appreciate the opportunity to participate in this patient's care. He knows to call with any questions or concerns in the meantime.    We personally spent 60 minutes in this encounter including chart review, reviewing radiological studies, meeting face-to-face with the patient, entering orders and completing documentation.     ------------------------------------------------   Joyice Faster, PA-C    Margaretmary Dys, MD  [facility]  Radiation Oncology Direct Dial: [phone]  Fax: [phone] [email or web address]  Skype  LinkedIn

## 2023-04-09 NOTE — Telephone Encounter (Signed)
RN called patient to see if he was on his way or here at Cancer center for scheduled appointment no answer left voicemail to call back.

## 2023-04-13 ENCOUNTER — Other Ambulatory Visit: Payer: Self-pay

## 2023-04-13 DIAGNOSIS — C7801 Secondary malignant neoplasm of right lung: Secondary | ICD-10-CM

## 2023-04-16 ENCOUNTER — Inpatient Hospital Stay: Payer: Medicare HMO | Attending: Internal Medicine | Admitting: Internal Medicine

## 2023-04-16 ENCOUNTER — Inpatient Hospital Stay: Payer: Medicare HMO

## 2023-04-16 VITALS — BP 133/84 | HR 73 | Temp 98.3°F | Resp 17 | Ht 69.0 in | Wt 171.1 lb

## 2023-04-16 DIAGNOSIS — Z8546 Personal history of malignant neoplasm of prostate: Secondary | ICD-10-CM | POA: Insufficient documentation

## 2023-04-16 DIAGNOSIS — C7801 Secondary malignant neoplasm of right lung: Secondary | ICD-10-CM

## 2023-04-16 DIAGNOSIS — Z9079 Acquired absence of other genital organ(s): Secondary | ICD-10-CM | POA: Diagnosis not present

## 2023-04-16 DIAGNOSIS — Z923 Personal history of irradiation: Secondary | ICD-10-CM | POA: Insufficient documentation

## 2023-04-16 DIAGNOSIS — R918 Other nonspecific abnormal finding of lung field: Secondary | ICD-10-CM | POA: Diagnosis not present

## 2023-04-16 DIAGNOSIS — Z87891 Personal history of nicotine dependence: Secondary | ICD-10-CM | POA: Diagnosis not present

## 2023-04-16 DIAGNOSIS — C3490 Malignant neoplasm of unspecified part of unspecified bronchus or lung: Secondary | ICD-10-CM | POA: Diagnosis not present

## 2023-04-16 LAB — CBC WITH DIFFERENTIAL (CANCER CENTER ONLY)
Abs Immature Granulocytes: 0.01 10*3/uL (ref 0.00–0.07)
Basophils Absolute: 0.1 10*3/uL (ref 0.0–0.1)
Basophils Relative: 1 %
Eosinophils Absolute: 0.1 10*3/uL (ref 0.0–0.5)
Eosinophils Relative: 2 %
HCT: 39 % (ref 39.0–52.0)
Hemoglobin: 12.9 g/dL — ABNORMAL LOW (ref 13.0–17.0)
Immature Granulocytes: 0 %
Lymphocytes Relative: 20 %
Lymphs Abs: 1 10*3/uL (ref 0.7–4.0)
MCH: 28.7 pg (ref 26.0–34.0)
MCHC: 33.1 g/dL (ref 30.0–36.0)
MCV: 86.9 fL (ref 80.0–100.0)
Monocytes Absolute: 0.5 10*3/uL (ref 0.1–1.0)
Monocytes Relative: 10 %
Neutro Abs: 3.5 10*3/uL (ref 1.7–7.7)
Neutrophils Relative %: 67 %
Platelet Count: 160 10*3/uL (ref 150–400)
RBC: 4.49 MIL/uL (ref 4.22–5.81)
RDW: 13.1 % (ref 11.5–15.5)
WBC Count: 5.3 10*3/uL (ref 4.0–10.5)
nRBC: 0 % (ref 0.0–0.2)

## 2023-04-16 LAB — CMP (CANCER CENTER ONLY)
ALT: 31 U/L (ref 0–44)
AST: 28 U/L (ref 15–41)
Albumin: 4.2 g/dL (ref 3.5–5.0)
Alkaline Phosphatase: 65 U/L (ref 38–126)
Anion gap: 6 (ref 5–15)
BUN: 36 mg/dL — ABNORMAL HIGH (ref 8–23)
CO2: 28 mmol/L (ref 22–32)
Calcium: 10.2 mg/dL (ref 8.9–10.3)
Chloride: 106 mmol/L (ref 98–111)
Creatinine: 1.56 mg/dL — ABNORMAL HIGH (ref 0.61–1.24)
GFR, Estimated: 48 mL/min — ABNORMAL LOW (ref >60)
Glucose, Bld: 129 mg/dL — ABNORMAL HIGH (ref 70–99)
Potassium: 4.2 mmol/L (ref 3.5–5.1)
Sodium: 140 mmol/L (ref 135–145)
Total Bilirubin: 0.8 mg/dL (ref <1.2)
Total Protein: 6.6 g/dL (ref 6.5–8.1)

## 2023-04-16 NOTE — Progress Notes (Signed)
[facility] [phone]   [phone]  CONSULT NOTE  REFERRING PHYSICIAN: Dr. Levy Pupa  REASON FOR CONSULTATION:  67 years old white male recently diagnosed with lung cancer  HPI Douglas Zhang is a 67 y.o. male came to the clinic today for initial evaluation of his suspicious lung cancer accompanied by his sister.   Discussed the use of AI scribe software for clinical note transcription with the patient, who gave verbal consent to proceed.  History of Present Illness   Douglas Zhang, a 67 year old patient with a history of prostate cancer, presents with concerns about potential lung cancer. The patient was diagnosed with prostate cancer around 2010 and underwent surgery in 2012, followed by approximately 38-40 radiation treatments. The patient's urologist, Dr. Laverle Patter, has been monitoring the patient's condition with scans. 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  The nodules were left upper lobe nodule measuring 1.4 cm with SUV max of 6.4.  A lingular nodule measured 0.9 cm with SUV max of 5.9 and the right upper lobe nodule measuring 0.8 cm with SUV max of 3.6.  There was no evidence of local prostate carcinoma recurrence in the prostate fossa and no evidence of metastatic adenopathy in the pelvis or periaortic retroperitoneum and no evidence of skeletal metastasis.  The patient was referred to Dr. Delton Coombes and CT super D of the chest on March 27, 2023 showed dominant left upper lobe pulmonary nodules including left upper lobe macrolobulated and somewhat branching nodule measuring 1.5 x 1.7 cm.  There was also a peripheral irregular anterior left upper lobe/lingular nodule measuring 1.0 x 1.0 cm and right apical nodule versus area of nodular scarring measuring 0.7 cm.  The patient underwent video bronchoscopy with robotic assisted bronchoscopic  navigation on March 27, 2023 and the final pathology [id number]) showed non-small cell carcinoma. The overall specimen is quite hypocellular.  IHC on limited material in cellblock was negative for TTF-1 and p40.  The clinical  history of remote prostate cancer is noted and an NKX3.1 will be attempted on the limited cellblock. An immunohistochemical stain for NKX3.1 is noncontributory due to lack of sufficient material for definitive evaluation.  Despite these findings, the patient reports no symptoms such as chest pain, shortness of breath, cough, or coughing up blood. The patient also denies experiencing any nausea, vomiting, diarrhea, weight loss, or changes in vision.  The patient has a history of smoking but quit in the early 1980s. The patient also used to consume alcohol but has been abstinent since the late 1980s. The patient denies any history of street drug use.  The patient's family history includes a mother who died from Alzheimer's disease and a father who died from a stroke. The patient has no siblings with a history of cancer. The patient is legally separated and has no children.  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 The patient is not allergic to any medications. The patient's physical examination was unremarkable, with no swollen glands and normal lung sounds.  The patient's prostate cancer was initially detected due to a high PSA and a Gleason score of 8 (4+4). The patient underwent radiation treatment under Dr. Dayton Scrape, who has since retired. The patient's current concern of potential lung  cancer was incidentally discovered during a routine scan by Dr. Laverle Patter.      HPI  Past Medical  History:  Diagnosis Date   AP (acute pancreatitis)    due to gallstones, history   Cataract    no surgery, just forming   COVID-19    Diabetes mellitus without complication [facility])    ED (erectile dysfunction)    GERD (gastroesophageal reflux disease)     Headache(784.0)    migraines   Hx of migraines    Hyperlipidemia    Hypertension    Prostate cancer (HCC) 01/09/2011   biopsy-gleason 4+4=8   Radiation 07/03/2012-08/21/2012   prostate 6600 cGy 33 sessions   Stress incontinence, male     Past Surgical History:  Procedure Laterality Date   BRONCHIAL BIOPSY  03/27/2023   Procedure: BRONCHIAL BIOPSIES;  Surgeon: Leslye Peer, MD;  Location: [facility] ENDOSCOPY;  Service: Pulmonary;;   BRONCHIAL BRUSHINGS  03/27/2023   Procedure: BRONCHIAL BRUSHINGS;  Surgeon: Leslye Peer, MD;  Location: [facility] ENDOSCOPY;  Service: Pulmonary;;   BRONCHIAL NEEDLE ASPIRATION BIOPSY  03/27/2023   Procedure: BRONCHIAL NEEDLE ASPIRATION BIOPSIES;  Surgeon: Leslye Peer, MD;  Location: [facility] ENDOSCOPY;  Service: Pulmonary;;   CHOLECYSTECTOMY  1990   CHOLECYSTECTOMY, LAPAROSCOPIC     CYSTOSCOPY WITH BIOPSY N/A 06/12/2013   Procedure: CYSTOSCOPY WITH BIOPSY bladder washing;  Surgeon: Crecencio Mc, MD;  Location: [facility] ORS;  Service: Urology;  Laterality: N/A;  BLADDER BIOPSY CYTOLOGY EXAM UNDER ANESTHESIA     INGUINAL HERNIA REPAIR     bi-lateral   PROSTATE SURGERY  03/02/11   gleason 4+4=8   VIDEO BRONCHOSCOPY WITH RADIAL ENDOBRONCHIAL ULTRASOUND  03/27/2023   Procedure: VIDEO BRONCHOSCOPY WITH RADIAL ENDOBRONCHIAL ULTRASOUND;  Surgeon: Leslye Peer, MD;  Location: [facility] ENDOSCOPY;  Service: Pulmonary;;    Family History  Problem Relation Age of Onset   Stroke Father    Cancer Maternal Grandmother        colon cancer    Social History Social History   Tobacco Use   Smoking status: Former    Current packs/day: 0.00    Average packs/day: 1 pack/day for 5.0 years (5.0 ttl pk-yrs)    Types: Cigarettes    Start date: 01/30/1978    Quit date: 01/31/1983    Years since quitting: 40.2   Smokeless tobacco: Never  Vaping Use   Vaping status: Never Used  Substance Use Topics   Alcohol use: No   Drug use: No    No Known Allergies  Current Outpatient  Medications  Medication Sig Dispense Refill   ACCU-CHEK GUIDE test strip USE 1  ONCE DAILY     aspirin EC 81 MG tablet Take by mouth.     atorvastatin (LIPITOR) 40 MG tablet Take by mouth.     Blood Glucose Monitoring Suppl (ACCU-CHEK GUIDE) w/Device KIT Use to check BS daily     calcium citrate-vitamin D (CITRACAL+D) 315-200 MG-UNIT per tablet Take 1 tablet by mouth 2 (two) times daily.     cetirizine (ZYRTEC) 10 MG tablet Take 10 mg by mouth daily.     cyanocobalamin ([employer] VITAMIN B12) 1000 MCG tablet Take 1 tablet by mouth daily.     glipiZIDE (GLUCOTROL XL) 10 MG 24 hr tablet Take 10 mg by mouth daily.     hydrochlorothiazide (HYDRODIURIL) 25 MG tablet Take 25 mg by mouth daily.      HYDROcodone-acetaminophen (NORCO/VICODIN) 5-325 MG per tablet Take 1 tablet by mouth every 6 (six) hours as needed for severe pain. 15 tablet 0   JANUVIA 100 MG tablet Take 100 mg by mouth daily.  lidocaine (LIDODERM) 5 % Apply patch to painful area. Patch may remain in place for up to 12 hours in a 24 hour period.     lisinopril (PRINIVIL,ZESTRIL) 20 MG tablet Take 20 mg by mouth daily with breakfast.      magnesium oxide (MAG-OX) 400 MG tablet Take by mouth.     metFORMIN (GLUCOPHAGE-XR) 500 MG 24 hr tablet Take 1,000 mg by mouth 2 (two) times daily.      Microlet Lancets MISC USE 1  TO CHECK GLUCOSE ONCE DAILY TO  CHECK  GLUCOSE     montelukast (SINGULAIR) 10 MG tablet Take 10 mg by mouth at bedtime.     Multiple Vitamins-Minerals (CENTRUM SILVER 50+MEN) TABS Take 1 tablet by mouth daily.     MYRBETRIQ 50 MG TB24 tablet Take 50 mg by mouth daily.      omeprazole (PRILOSEC) 20 MG capsule Take 20 mg by mouth daily as needed (indigestion).      OZEMPIC, 0.25 OR 0.5 MG/DOSE, 2 MG/3ML SOPN INJECT 1/2 (ONE-HALF) MG SUBCUTANEOUSLY ONCE A WEEK     SUMAtriptan (IMITREX) 100 MG tablet Take 100 mg by mouth every 2 (two) hours as needed for migraine (migraine).      triamcinolone ointment (KENALOG) 0.1 % APPLY   OINTMENT TOPICALLY TWICE DAILY     valsartan (DIOVAN) 80 MG tablet Take by mouth.     No current facility-administered medications for this visit.    Review of Systems  Constitutional: negative Eyes: negative Ears, nose, mouth, throat, and face: negative Respiratory: negative Cardiovascular: negative Gastrointestinal: negative Genitourinary:negative Integument/breast: negative Hematologic/lymphatic: negative Musculoskeletal:negative Neurological: negative Behavioral/Psych: negative Endocrine: negative Allergic/Immunologic: negative  Physical Exam  VHQ:IONGE, healthy, no distress, well nourished, and well developed SKIN: skin color, texture, turgor are normal, no rashes or significant lesions HEAD: Normocephalic, No masses, lesions, tenderness or abnormalities EYES: normal, PERRLA, Conjunctiva are pink and non-injected EARS: External ears normal, Canals clear OROPHARYNX:no exudate, no erythema, and lips, buccal mucosa, and tongue normal  NECK: supple, no adenopathy, no JVD LYMPH:  no palpable lymphadenopathy, no hepatosplenomegaly LUNGS: clear to auscultation , and palpation HEART: regular rate & rhythm, no murmurs, and no gallops ABDOMEN:abdomen soft, non-tender, normal bowel sounds, and no masses or organomegaly BACK: Back symmetric, no curvature., No CVA tenderness EXTREMITIES:no joint deformities, effusion, or inflammation, no edema  NEURO: alert & oriented x 3 with fluent speech, no focal motor/sensory deficits  PERFORMANCE STATUS: ECOG 1  LABORATORY DATA: Lab Results  Component Value Date   WBC 5.3 04/16/2023   HGB 12.9 (L) 04/16/2023   HCT 39.0 04/16/2023   MCV 86.9 04/16/2023   PLT 160 04/16/2023      Chemistry      Component Value Date/Time   NA 140 04/16/2023 1334   K 4.2 04/16/2023 1334   CL 106 04/16/2023 1334   CO2 28 04/16/2023 1334   BUN 36 (H) 04/16/2023 1334   CREATININE 1.56 (H) 04/16/2023 1334      Component Value Date/Time   CALCIUM  10.2 04/16/2023 1334   ALKPHOS 65 04/16/2023 1334   AST 28 04/16/2023 1334   ALT 31 04/16/2023 1334   BILITOT 0.8 04/16/2023 1334       RADIOGRAPHIC STUDIES: DG Chest Port 1 View  Result Date: 03/27/2023 CLINICAL DATA:  Status post bronchoscopy with biopsy. EXAM: PORTABLE CHEST 1 VIEW COMPARISON:  June 11, 2013. FINDINGS: The heart size and mediastinal contours are within normal limits. Both lungs are clear. The visualized skeletal structures are  unremarkable. IMPRESSION: No active disease. Electronically Signed   By: Lupita Raider M.D.   On: 03/27/2023 15:11   DG C-ARM BRONCHOSCOPY  Result Date: 03/27/2023 C-ARM BRONCHOSCOPY: Fluoroscopy was utilized by the requesting physician.  No radiographic interpretation.   DG C-Arm 1-60 Min-No Report  Result Date: 03/27/2023 Fluoroscopy was utilized by the requesting physician.  No radiographic interpretation.   CT SUPER D CHEST WO CONTRAST  Result Date: 03/27/2023 CLINICAL DATA:  Prostate cancer with pulmonary nodules on PET. Surgical planning. * Tracking Code: BO * EXAM: CT CHEST WITHOUT CONTRAST TECHNIQUE: Multidetector CT imaging of the chest was performed using thin slice collimation for electromagnetic bronchoscopy planning purposes, without intravenous contrast. RADIATION DOSE REDUCTION: This exam was performed according to the departmental dose-optimization program which includes automated exposure control, adjustment of the mA and/or kV according to patient size and/or use of iterative reconstruction technique. COMPARISON:  PET 02/16/2023.  No prior diagnostic chest CT. FINDINGS: Cardiovascular: Bovine arch. Aortic atherosclerosis. Upper normal ascending aortic caliber at 3.9 cm. Normal heart size, without pericardial effusion. Lad and left circumflex coronary artery calcification. Mediastinum/Nodes: No supraclavicular adenopathy. No mediastinal or hilar adenopathy. Lungs/Pleura:  No pleural fluid. Left apical pleuroparenchymal  scarring. Right apical nodule versus area of nodular scarring at 7 mm on 20/4. Left upper lobe macrolobulated, somewhat branching nodule measures on the order of 1.5 x 1.7 cm on 58/4, relatively similar to the prior PET. Peripheral irregular anterior left upper lobe/lingular nodule measures 10 x 10 mm on 81/4 and is similar to the prior. Upper Abdomen: Cholecystectomy. Normal imaged portions of the liver, spleen, stomach, right adrenal gland, left kidney. An incompletely imaged upper pole right renal 7.1 cm fluid density lesion is likely a cyst . In the absence of clinically indicated signs/symptoms require(s) no independent follow-up. 1.2 cm left adrenal nodule measures 38 HU and was not tracer avid on prior PET. This has been present back to 2014 per report and can be presumed an adenoma. The common duct measures 1.8 cm on 160/3 and is similar back to 10/26/2016. Musculoskeletal: Moderate to marked T11 compression deformity is new since 12/28/2021 CT. IMPRESSION: 1. Dominant left upper lobe pulmonary nodules as detailed above. The more cephalad is branching and could represent an endobronchial metastasis or an area of mucoid impaction. The more caudal is irregular/rounded and most likely metastasis or metachronous lung primary. 2. New T11 compression deformity since 2023. 3. Incidental findings, including: Coronary artery atherosclerosis. Aortic Atherosclerosis (ICD10-I70.0). 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 Electronically Signed   By: Jeronimo Greaves M.D.   On: 03/27/2023 10:47    ASSESSMENT AND PLAN: This is a very pleasant 67 years old white male with history of prostate adenocarcinoma followed by urology and presentation of 3 pulmonary nodules 2 in the left upper lobe suspicious for stage IIb (T3, N0, M0) non-small cell lung cancer versus metastatic adenocarcinoma of the prostate.  The patient also has a right upper lobe pulmonary nodule that is suspicious for another primary or  scarring. I had a lengthy discussion with the patient and his sister today about his current condition and further treatment options.  I personally and independently reviewed the scan images and discussed the result with the patient today.    Suspicious Lung Nodules Three nodules identified on PMSA PET scan: two in the left upper lobe and one in the right upper lobe. No symptoms such as chest pain, dyspnea, cough, hemoptysis, nausea, vomiting, diarrhea, weight loss, headache, or vision changes.  Differential diagnosis includes non-small cell lung cancer (likely adenocarcinoma) and metastatic prostate cancer. Biopsy material insufficient to determine exact subtype. Discussed potential benefits of surgical resection of the left lung nodule for better diagnostic clarity and genetic analysis. Explained that surgery may result in reduced lung capacity but provides more information about the cancer type. Radiation therapy as an alternative, which can target all nodules but may not provide definitive cancer type identification. Emphasized the importance of meeting with a surgeon to discuss surgical feasibility and potential outcomes. - Order MRI of the brain to check for metastasis - Refer to surgeon for consultation regarding potential resection of left lung nodule - Discuss radiation therapy as an alternative treatment option - Review pulmonary function test results with the surgeon  Prostate Cancer Diagnosed in 2010 with a Gleason score of 8 (4+4). Underwent prostatectomy in 2012 followed by 38 sessions of radiation therapy. Recent PMSA PET scan in September 2024 showed no local recurrence but identified lung nodules. - Continue monitoring PSA levels - Consider further treatment based on the final diagnosis of lung nodules  General Health Maintenance No recent weight loss, new symptoms, or allergies to medications. Quit smoking in the early 1980s and stopped alcohol consumption in the late 1980s. No  history of drug abuse. Remains active. - Encourage continued abstinence from smoking and alcohol - Maintain regular physical activity  Follow-up - Follow up with the oncologist 3 weeks post-surgery if surgery is performed - Schedule follow-up appointment after radiation therapy if chosen - Ensure results of pulmonary function test are available for the surgeon.   He was advised to call immediately if he has any other concerning symptoms in the interval. The patient voices understanding of current disease status and treatment options and is in agreement with the current care plan.  All questions were answered. The patient knows to call the clinic with any problems, questions or concerns. We can certainly see the patient much sooner if necessary.  Thank you so much for allowing me to participate in the care of Douglas Zhang. I will continue to follow up the patient with you and assist in his care.  The total time spent in the appointment was 60 minutes.  Disclaimer: This note was dictated with voice recognition software. Similar sounding words can inadvertently be transcribed and may not be corrected upon review.   Lajuana Matte April 16, 2023, 2:41 PM

## 2023-04-17 ENCOUNTER — Institutional Professional Consult (permissible substitution) (INDEPENDENT_AMBULATORY_CARE_PROVIDER_SITE_OTHER): Payer: Medicare HMO | Admitting: Thoracic Surgery (Cardiothoracic Vascular Surgery)

## 2023-04-17 VITALS — BP 141/78 | HR 64 | Resp 18 | Ht 69.0 in | Wt 171.0 lb

## 2023-04-17 DIAGNOSIS — C3490 Malignant neoplasm of unspecified part of unspecified bronchus or lung: Secondary | ICD-10-CM | POA: Diagnosis not present

## 2023-04-17 DIAGNOSIS — E119 Type 2 diabetes mellitus without complications: Secondary | ICD-10-CM | POA: Insufficient documentation

## 2023-04-17 NOTE — Progress Notes (Signed)
PCP is Paulino Door, MD Referring Provider is Bevelyn Ngo, NP  Chief Complaint  Patient presents with   Lung Cancer    Chest CT 10/22, PET 9/13, bronch 10/22, PFTs 11/1    HPI: Douglas Zhang is sent for consultation regarding multiple lung nodules.  Douglas Zhang is a 67 year old man with a remote history of tobacco use, hypertension, hyperlipidemia, reflux, type 2 diabetes without complication, and prostate cancer.  He was diagnosed with prostate cancer in 2012 and treated at that time.  He recently saw Dr. Laverle Patter.  PSA was elevated at 5 consistent with a biochemical recurrence.  He then had a Pylarify PET which showed 3 lung nodules all of which had significant activity.  He underwent navigational bronchoscopy by Dr. Delton Coombes.  Biopsies of the central left upper lobe nodule showed non-small cell carcinoma, but there was insufficient tissue to determine if this was lung or prostate primary.  Biopsies from the peripheral left upper lobe nodule were suspicious.  Biopsies from the right upper lobe nodule were insufficient.  He feels well.  He smoked less than a pack a day for less than 10 years prior to quitting in 1985.  He denies any chest pain, pressure, tightness, shortness of breath.  He is active.  He is retired.  No change in appetite or weight loss.  No headaches or visual changes.  Zubrod Score: At the time of surgery this patient's most appropriate activity status/level should be described as: [x]     0    Normal activity, no symptoms []     1    Restricted in physical strenuous activity but ambulatory, able to do out light work []     2    Ambulatory and capable of self care, unable to do work activities, up and about >50 % of waking hours                              []     3    Only limited self care, in bed greater than 50% of waking hours []     4    Completely disabled, no self care, confined to bed or chair []     5    Moribund  Past Medical History:  Diagnosis Date   AP (acute  pancreatitis)    due to gallstones, history   Cataract    no surgery, just forming   COVID-19    Diabetes mellitus without complication [facility])    ED (erectile dysfunction)    GERD (gastroesophageal reflux disease)    Headache(784.0)    migraines   Hx of migraines    Hyperlipidemia    Hypertension    Prostate cancer (HCC) 01/09/2011   biopsy-gleason 4+4=8   Radiation 07/03/2012-08/21/2012   prostate 6600 cGy 33 sessions   Stress incontinence, male     Past Surgical History:  Procedure Laterality Date   BRONCHIAL BIOPSY  03/27/2023   Procedure: BRONCHIAL BIOPSIES;  Surgeon: Leslye Peer, MD;  Location: [facility] ENDOSCOPY;  Service: Pulmonary;;   BRONCHIAL BRUSHINGS  03/27/2023   Procedure: BRONCHIAL BRUSHINGS;  Surgeon: Leslye Peer, MD;  Location[facility] ENDOSCOPY;  Service: Pulmonary;;   BRONCHIAL NEEDLE ASPIRATION BIOPSY  03/27/2023   Procedure: BRONCHIAL NEEDLE ASPIRATION BIOPSIES;  Surgeon: Leslye Peer, MD;  Location: [facility] ENDOSCOPY;  Service: Pulmonary;;   CHOLECYSTECTOMY  1990   CHOLECYSTECTOMY, LAPAROSCOPIC     CYSTOSCOPY WITH BIOPSY N/A 06/12/2013   Procedure: CYSTOSCOPY  WITH BIOPSY bladder washing;  Surgeon: Crecencio Mc, MD;  Location: [facility] ORS;  Service: Urology;  Laterality: N/A;  BLADDER BIOPSY CYTOLOGY EXAM UNDER ANESTHESIA     INGUINAL HERNIA REPAIR     bi-lateral   PROSTATE SURGERY  03/02/11   gleason 4+4=8   VIDEO BRONCHOSCOPY WITH RADIAL ENDOBRONCHIAL ULTRASOUND  03/27/2023   Procedure: VIDEO BRONCHOSCOPY WITH RADIAL ENDOBRONCHIAL ULTRASOUND;  Surgeon: Leslye Peer, MD;  Location: [facility] ENDOSCOPY;  Service: Pulmonary;;    Family History  Problem Relation Age of Onset   Stroke Father    Cancer Maternal Grandmother        colon cancer    Social History Social History   Tobacco Use   Smoking status: Former    Current packs/day: 0.00    Average packs/day: 1 pack/day for 5.0 years (5.0 ttl pk-yrs)    Types: Cigarettes    Start date: 01/30/1978    Quit  date: 01/31/1983    Years since quitting: 40.2   Smokeless tobacco: Never  Vaping Use   Vaping status: Never Used  Substance Use Topics   Alcohol use: No   Drug use: No    Current Outpatient Medications  Medication Sig Dispense Refill   ACCU-CHEK GUIDE test strip USE 1  ONCE DAILY     aspirin EC 81 MG tablet Take by mouth.     atorvastatin (LIPITOR) 40 MG tablet Take by mouth.     Blood Glucose Monitoring Suppl (ACCU-CHEK GUIDE) w/Device KIT Use to check BS daily     calcium citrate-vitamin D (CITRACAL+D) 315-200 MG-UNIT per tablet Take 1 tablet by mouth 2 (two) times daily.     cetirizine (ZYRTEC) 10 MG tablet Take 10 mg by mouth daily.     cyanocobalamin ([employer] VITAMIN B12) 1000 MCG tablet Take 1 tablet by mouth daily.     glipiZIDE (GLUCOTROL XL) 10 MG 24 hr tablet Take 10 mg by mouth daily.     hydrochlorothiazide (HYDRODIURIL) 25 MG tablet Take 25 mg by mouth daily.      HYDROcodone-acetaminophen (NORCO/VICODIN) 5-325 MG per tablet Take 1 tablet by mouth every 6 (six) hours as needed for severe pain. 15 tablet 0   JANUVIA 100 MG tablet Take 100 mg by mouth daily.     lidocaine (LIDODERM) 5 % Apply patch to painful area. Patch may remain in place for up to 12 hours in a 24 hour period.     lisinopril (PRINIVIL,ZESTRIL) 20 MG tablet Take 20 mg by mouth daily with breakfast.      magnesium oxide (MAG-OX) 400 MG tablet Take by mouth.     metFORMIN (GLUCOPHAGE-XR) 500 MG 24 hr tablet Take 1,000 mg by mouth 2 (two) times daily.      Microlet Lancets MISC USE 1  TO CHECK GLUCOSE ONCE DAILY TO  CHECK  GLUCOSE     montelukast (SINGULAIR) 10 MG tablet Take 10 mg by mouth at bedtime.     Multiple Vitamins-Minerals (CENTRUM SILVER 50+MEN) TABS Take 1 tablet by mouth daily.     MYRBETRIQ 50 MG TB24 tablet Take 50 mg by mouth daily.      omeprazole (PRILOSEC) 20 MG capsule Take 20 mg by mouth daily as needed (indigestion).      OZEMPIC, 0.25 OR 0.5 MG/DOSE, 2 MG/3ML SOPN INJECT 1/2 (ONE-HALF) MG  SUBCUTANEOUSLY ONCE A WEEK     SUMAtriptan (IMITREX) 100 MG tablet Take 100 mg by mouth every 2 (two) hours as needed for migraine (migraine).  triamcinolone ointment (KENALOG) 0.1 % APPLY  OINTMENT TOPICALLY TWICE DAILY     valsartan (DIOVAN) 80 MG tablet Take by mouth.     No current facility-administered medications for this visit.    No Known Allergies  Review of Systems  Constitutional:  Negative for activity change, fatigue and unexpected weight change.  HENT:  Positive for dental problem (Dentures). Negative for trouble swallowing and voice change.   Respiratory:  Negative for shortness of breath.   Cardiovascular:  Negative for chest pain and leg swelling.  Gastrointestinal:  Negative for abdominal distention and abdominal pain.  Genitourinary:  Negative for difficulty urinating and dysuria.  Musculoskeletal:  Negative for arthralgias and myalgias.  Neurological:  Negative for seizures and weakness.  Hematological:  Negative for adenopathy. Does not bruise/bleed easily.  All other systems reviewed and are negative.   BP (!) 141/78 (BP Location: Right Arm, Patient Position: Sitting)   Pulse 64   Resp 18   Ht 5\' 9"  (1.753 m)   Wt 171 lb (77.6 kg)   SpO2 98% Comment: RA  BMI 25.25 kg/m  Physical Exam Vitals reviewed.  Constitutional:      Appearance: Normal appearance.  HENT:     Head: Normocephalic and atraumatic.  Eyes:     General: No scleral icterus.    Extraocular Movements: Extraocular movements intact.  Neck:     Vascular: No carotid bruit.  Cardiovascular:     Rate and Rhythm: Normal rate and regular rhythm.     Pulses: Normal pulses.     Heart sounds: Normal heart sounds. No murmur heard. Pulmonary:     Effort: Pulmonary effort is normal. No respiratory distress.     Breath sounds: Normal breath sounds. No wheezing or rales.  Abdominal:     General: There is no distension.     Palpations: Abdomen is soft.  Lymphadenopathy:     Cervical: No  cervical adenopathy.  Skin:    General: Skin is warm and dry.  Neurological:     General: No focal deficit present.     Mental Status: He is alert and oriented to person, place, and time.     Cranial Nerves: No cranial nerve deficit.     Motor: No weakness.     Diagnostic Tests: NUCLEAR MEDICINE PET SKULL BASE TO THIGH   TECHNIQUE: 8.2 mCi F18 Piflufolastat (Pylarify) was injected intravenously. Full-ring PET imaging was performed from the skull base to thigh after the radiotracer. CT data was obtained and used for attenuation correction and anatomic localization.   COMPARISON:  None Available.   FINDINGS: NECK   No radiotracer activity in neck lymph nodes.   Incidental CT finding: None.   CHEST   Three radiotracer avid pulmonary nodules.   LEFT upper lobe lobular nodule measuring 14 mm (image 76) with SUV max equal 6.4.   Lingular nodule measuring 9 mm (image 88) with SUV max equal 5.9   RIGHT upper lobe nodule measuring 8 mm SUV max equal 3.6 (image 59).   No hypermetabolic mediastinal   Incidental CT finding: None.   ABDOMEN/PELVIS   Prostate: Post prostatectomy. No abnormal radiotracer avid lesion in the prostate fossa.   Lymph nodes: No abnormal radiotracer accumulation within pelvic or abdominal nodes.   Liver: No evidence of liver metastasis.   Incidental CT finding: Large benign simple fluid attenuation cyst of the RIGHT kidney measures 10.5 cm. Postcholecystectomy.   SKELETON   No focal activity to suggest skeletal metastasis. No sclerotic or lytic  lesions on CT imaging.   IMPRESSION: 1. Three radiotracer avid pulmonary nodules most consistent with prostate cancer pulmonary metastasis. 2. No evidence of local prostate carcinoma recurrence in the prostate fossa. 3. No evidence of metastatic adenopathy in the pelvis or periaortic retroperitoneum. 4. No evidence of skeletal metastasis.     Electronically Signed   By: Genevive Bi  M.D.   On: 02/21/2023 14:44 CT CHEST WITHOUT CONTRAST   TECHNIQUE: Multidetector CT imaging of the chest was performed using thin slice collimation for electromagnetic bronchoscopy planning purposes, without intravenous contrast.   RADIATION DOSE REDUCTION: This exam was performed according to the departmental dose-optimization program which includes automated exposure control, adjustment of the mA and/or kV according to patient size and/or use of iterative reconstruction technique.   COMPARISON:  PET 02/16/2023.  No prior diagnostic chest CT.   FINDINGS: Cardiovascular: Bovine arch. Aortic atherosclerosis. Upper normal ascending aortic caliber at 3.9 cm. Normal heart size, without pericardial effusion. Lad and left circumflex coronary artery calcification.   Mediastinum/Nodes: No supraclavicular adenopathy. No mediastinal or hilar adenopathy.   Lungs/Pleura:  No pleural fluid.   Left apical pleuroparenchymal scarring. Right apical nodule versus area of nodular scarring at 7 mm on 20/4.   Left upper lobe macrolobulated, somewhat branching nodule measures on the order of 1.5 x 1.7 cm on 58/4, relatively similar to the prior PET.   Peripheral irregular anterior left upper lobe/lingular nodule measures 10 x 10 mm on 81/4 and is similar to the prior.   Upper Abdomen: Cholecystectomy. Normal imaged portions of the liver, spleen, stomach, right adrenal gland, left kidney. An incompletely imaged upper pole right renal 7.1 cm fluid density lesion is likely a cyst . In the absence of clinically indicated signs/symptoms require(s) no independent follow-up.   1.2 cm left adrenal nodule measures 38 HU and was not tracer avid on prior PET. This has been present back to 2014 per report and can be presumed an adenoma.   The common duct measures 1.8 cm on 160/3 and is similar back to 10/26/2016.   Musculoskeletal: Moderate to marked T11 compression deformity is new since 12/28/2021  CT.   IMPRESSION: 1. Dominant left upper lobe pulmonary nodules as detailed above. The more cephalad is branching and could represent an endobronchial metastasis or an area of mucoid impaction. The more caudal is irregular/rounded and most likely metastasis or metachronous lung primary. 2. New T11 compression deformity since 2023. 3. Incidental findings, including: Coronary artery atherosclerosis. Aortic Atherosclerosis (ICD10-I70.0). 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     Electronically Signed   By: Jeronimo Greaves M.D.   On: 03/27/2023 10:47 I personally reviewed the CT and Pylarify PET/CT images.  There are 3 lung nodules 1 in the right upper lobe and 2 in the left upper lobe.  All had significant uptake on the Pylarify PET. Coronary and aortic atherosclerosis Left adrenal adenoma  Pulmonary function testing 04/06/2023 FVC 4.55 (104%) FEV1 3.61 (112%) No significant change in bronchodilator DLCO 26.41 (103%)  Impression: Douglas Zhang is a 67 year old man with a remote history of tobacco use, hypertension, hyperlipidemia, reflux, type 2 diabetes without complication, and prostate cancer.  He was diagnosed with prostate cancer in 2012 and treated at that time.   Prostate cancer-treated in 2012.  Now with a biochemical recurrence.  Pylarify PET showed 3 lung nodules with activity.  Lung nodules-2 nodules in the left upper lobe and 1 in the right upper lobe.  Biopsy showed non-small cell carcinoma but  there was not sufficient tissue to distinguish between prostate and lung primary.  However with the elevated PSA, the activity on the Pylarify PET, the absence of any other sites of disease on the PET, it is likely that these represent metastatic prostate cancer.  Cannot completely rule out the possibility that these are lung primaries.  Will review with our radiologist at our next thoracic oncology conference later this week.  Will discuss with Dr. Laverle Patter other potential  treatment options including hormonal therapy or chemotherapy.  He is a surgical candidate and these certainly could be resected.  However the left upper lobe lesions would require lobectomy.  Again he has adequate pulmonary function to tolerate that, but I do want to make sure everyone is on the same page before we go that route.  Plan: Will need to discuss plan with multidisciplinary thoracic oncology team and Dr. Laverle Patter.  Loreli Slot, MD [facility] [phone]

## 2023-04-17 NOTE — H&P (View-Only) (Signed)
 PCP is Paulino Door, MD Referring Provider is Bevelyn Ngo, NP  Chief Complaint  Patient presents with   Lung Cancer    Chest CT 10/22, PET 9/13, bronch 10/22, PFTs 11/1    HPI: Douglas Zhang is sent for consultation regarding multiple lung nodules.  Douglas Zhang is a 67 year old man with a remote history of tobacco use, hypertension, hyperlipidemia, reflux, type 2 diabetes without complication, and prostate cancer.  He was diagnosed with prostate cancer in 2012 and treated at that time.  He recently saw Dr. Laverle Patter.  PSA was elevated at 5 consistent with a biochemical recurrence.  He then had a Pylarify PET which showed 3 lung nodules all of which had significant activity.  He underwent navigational bronchoscopy by Dr. Delton Coombes.  Biopsies of the central left upper lobe nodule showed non-small cell carcinoma, but there was insufficient tissue to determine if this was lung or prostate primary.  Biopsies from the peripheral left upper lobe nodule were suspicious.  Biopsies from the right upper lobe nodule were insufficient.  He feels well.  He smoked less than a pack a day for less than 10 years prior to quitting in 1985.  He denies any chest pain, pressure, tightness, shortness of breath.  He is active.  He is retired.  No change in appetite or weight loss.  No headaches or visual changes.  Zubrod Score: At the time of surgery this patient's most appropriate activity status/level should be described as: [x]     0    Normal activity, no symptoms []     1    Restricted in physical strenuous activity but ambulatory, able to do out light work []     2    Ambulatory and capable of self care, unable to do work activities, up and about >50 % of waking hours                              []     3    Only limited self care, in bed greater than 50% of waking hours []     4    Completely disabled, no self care, confined to bed or chair []     5    Moribund  Past Medical History:  Diagnosis Date   AP (acute  pancreatitis)    due to gallstones, history   Cataract    no surgery, just forming   COVID-19    Diabetes mellitus without complication [facility])    ED (erectile dysfunction)    GERD (gastroesophageal reflux disease)    Headache(784.0)    migraines   Hx of migraines    Hyperlipidemia    Hypertension    Prostate cancer (HCC) 01/09/2011   biopsy-gleason 4+4=8   Radiation 07/03/2012-08/21/2012   prostate 6600 cGy 33 sessions   Stress incontinence, male     Past Surgical History:  Procedure Laterality Date   BRONCHIAL BIOPSY  03/27/2023   Procedure: BRONCHIAL BIOPSIES;  Surgeon: Leslye Peer, MD;  Location: [facility] ENDOSCOPY;  Service: Pulmonary;;   BRONCHIAL BRUSHINGS  03/27/2023   Procedure: BRONCHIAL BRUSHINGS;  Surgeon: Leslye Peer, MD;  Location: [facility] ENDOSCOPY;  Service: Pulmonary;;   BRONCHIAL NEEDLE ASPIRATION BIOPSY  03/27/2023   Procedure: BRONCHIAL NEEDLE ASPIRATION BIOPSIES;  Surgeon: Leslye Peer, MD;  Location: [facility] ENDOSCOPY;  Service: Pulmonary;;   CHOLECYSTECTOMY  1990   CHOLECYSTECTOMY, LAPAROSCOPIC     CYSTOSCOPY WITH BIOPSY N/A 06/12/2013   Procedure: CYSTOSCOPY  WITH BIOPSY bladder washing;  Surgeon: Crecencio Mc, MD;  Location: [facility] ORS;  Service: Urology;  Laterality: N/A;  BLADDER BIOPSY CYTOLOGY EXAM UNDER ANESTHESIA     INGUINAL HERNIA REPAIR     bi-lateral   PROSTATE SURGERY  03/02/11   gleason 4+4=8   VIDEO BRONCHOSCOPY WITH RADIAL ENDOBRONCHIAL ULTRASOUND  03/27/2023   Procedure: VIDEO BRONCHOSCOPY WITH RADIAL ENDOBRONCHIAL ULTRASOUND;  Surgeon: Leslye Peer, MD;  Location: [facility] ENDOSCOPY;  Service: Pulmonary;;    Family History  Problem Relation Age of Onset   Stroke Father    Cancer Maternal Grandmother        colon cancer    Social History Social History   Tobacco Use   Smoking status: Former    Current packs/day: 0.00    Average packs/day: 1 pack/day for 5.0 years (5.0 ttl pk-yrs)    Types: Cigarettes    Start date: 01/30/1978    Quit  date: 01/31/1983    Years since quitting: 40.2   Smokeless tobacco: Never  Vaping Use   Vaping status: Never Used  Substance Use Topics   Alcohol use: No   Drug use: No    Current Outpatient Medications  Medication Sig Dispense Refill   ACCU-CHEK GUIDE test strip USE 1  ONCE DAILY     aspirin EC 81 MG tablet Take by mouth.     atorvastatin (LIPITOR) 40 MG tablet Take by mouth.     Blood Glucose Monitoring Suppl (ACCU-CHEK GUIDE) w/Device KIT Use to check BS daily     calcium citrate-vitamin D (CITRACAL+D) 315-200 MG-UNIT per tablet Take 1 tablet by mouth 2 (two) times daily.     cetirizine (ZYRTEC) 10 MG tablet Take 10 mg by mouth daily.     cyanocobalamin ([employer] VITAMIN B12) 1000 MCG tablet Take 1 tablet by mouth daily.     glipiZIDE (GLUCOTROL XL) 10 MG 24 hr tablet Take 10 mg by mouth daily.     hydrochlorothiazide (HYDRODIURIL) 25 MG tablet Take 25 mg by mouth daily.      HYDROcodone-acetaminophen (NORCO/VICODIN) 5-325 MG per tablet Take 1 tablet by mouth every 6 (six) hours as needed for severe pain. 15 tablet 0   JANUVIA 100 MG tablet Take 100 mg by mouth daily.     lidocaine (LIDODERM) 5 % Apply patch to painful area. Patch may remain in place for up to 12 hours in a 24 hour period.     lisinopril (PRINIVIL,ZESTRIL) 20 MG tablet Take 20 mg by mouth daily with breakfast.      magnesium oxide (MAG-OX) 400 MG tablet Take by mouth.     metFORMIN (GLUCOPHAGE-XR) 500 MG 24 hr tablet Take 1,000 mg by mouth 2 (two) times daily.      Microlet Lancets MISC USE 1  TO CHECK GLUCOSE ONCE DAILY TO  CHECK  GLUCOSE     montelukast (SINGULAIR) 10 MG tablet Take 10 mg by mouth at bedtime.     Multiple Vitamins-Minerals (CENTRUM SILVER 50+MEN) TABS Take 1 tablet by mouth daily.     MYRBETRIQ 50 MG TB24 tablet Take 50 mg by mouth daily.      omeprazole (PRILOSEC) 20 MG capsule Take 20 mg by mouth daily as needed (indigestion).      OZEMPIC, 0.25 OR 0.5 MG/DOSE, 2 MG/3ML SOPN INJECT 1/2 (ONE-HALF) MG  SUBCUTANEOUSLY ONCE A WEEK     SUMAtriptan (IMITREX) 100 MG tablet Take 100 mg by mouth every 2 (two) hours as needed for migraine (migraine).  triamcinolone ointment (KENALOG) 0.1 % APPLY  OINTMENT TOPICALLY TWICE DAILY     valsartan (DIOVAN) 80 MG tablet Take by mouth.     No current facility-administered medications for this visit.    No Known Allergies  Review of Systems  Constitutional:  Negative for activity change, fatigue and unexpected weight change.  HENT:  Positive for dental problem (Dentures). Negative for trouble swallowing and voice change.   Respiratory:  Negative for shortness of breath.   Cardiovascular:  Negative for chest pain and leg swelling.  Gastrointestinal:  Negative for abdominal distention and abdominal pain.  Genitourinary:  Negative for difficulty urinating and dysuria.  Musculoskeletal:  Negative for arthralgias and myalgias.  Neurological:  Negative for seizures and weakness.  Hematological:  Negative for adenopathy. Does not bruise/bleed easily.  All other systems reviewed and are negative.   BP (!) 141/78 (BP Location: Right Arm, Patient Position: Sitting)   Pulse 64   Resp 18   Ht 5\' 9"  (1.753 m)   Wt 171 lb (77.6 kg)   SpO2 98% Comment: RA  BMI 25.25 kg/m  Physical Exam Vitals reviewed.  Constitutional:      Appearance: Normal appearance.  HENT:     Head: Normocephalic and atraumatic.  Eyes:     General: No scleral icterus.    Extraocular Movements: Extraocular movements intact.  Neck:     Vascular: No carotid bruit.  Cardiovascular:     Rate and Rhythm: Normal rate and regular rhythm.     Pulses: Normal pulses.     Heart sounds: Normal heart sounds. No murmur heard. Pulmonary:     Effort: Pulmonary effort is normal. No respiratory distress.     Breath sounds: Normal breath sounds. No wheezing or rales.  Abdominal:     General: There is no distension.     Palpations: Abdomen is soft.  Lymphadenopathy:     Cervical: No  cervical adenopathy.  Skin:    General: Skin is warm and dry.  Neurological:     General: No focal deficit present.     Mental Status: He is alert and oriented to person, place, and time.     Cranial Nerves: No cranial nerve deficit.     Motor: No weakness.     Diagnostic Tests: NUCLEAR MEDICINE PET SKULL BASE TO THIGH   TECHNIQUE: 8.2 mCi F18 Piflufolastat (Pylarify) was injected intravenously. Full-ring PET imaging was performed from the skull base to thigh after the radiotracer. CT data was obtained and used for attenuation correction and anatomic localization.   COMPARISON:  None Available.   FINDINGS: NECK   No radiotracer activity in neck lymph nodes.   Incidental CT finding: None.   CHEST   Three radiotracer avid pulmonary nodules.   LEFT upper lobe lobular nodule measuring 14 mm (image 76) with SUV max equal 6.4.   Lingular nodule measuring 9 mm (image 88) with SUV max equal 5.9   RIGHT upper lobe nodule measuring 8 mm SUV max equal 3.6 (image 59).   No hypermetabolic mediastinal   Incidental CT finding: None.   ABDOMEN/PELVIS   Prostate: Post prostatectomy. No abnormal radiotracer avid lesion in the prostate fossa.   Lymph nodes: No abnormal radiotracer accumulation within pelvic or abdominal nodes.   Liver: No evidence of liver metastasis.   Incidental CT finding: Large benign simple fluid attenuation cyst of the RIGHT kidney measures 10.5 cm. Postcholecystectomy.   SKELETON   No focal activity to suggest skeletal metastasis. No sclerotic or lytic  lesions on CT imaging.   IMPRESSION: 1. Three radiotracer avid pulmonary nodules most consistent with prostate cancer pulmonary metastasis. 2. No evidence of local prostate carcinoma recurrence in the prostate fossa. 3. No evidence of metastatic adenopathy in the pelvis or periaortic retroperitoneum. 4. No evidence of skeletal metastasis.     Electronically Signed   By: Genevive Bi  M.D.   On: 02/21/2023 14:44 CT CHEST WITHOUT CONTRAST   TECHNIQUE: Multidetector CT imaging of the chest was performed using thin slice collimation for electromagnetic bronchoscopy planning purposes, without intravenous contrast.   RADIATION DOSE REDUCTION: This exam was performed according to the departmental dose-optimization program which includes automated exposure control, adjustment of the mA and/or kV according to patient size and/or use of iterative reconstruction technique.   COMPARISON:  PET 02/16/2023.  No prior diagnostic chest CT.   FINDINGS: Cardiovascular: Bovine arch. Aortic atherosclerosis. Upper normal ascending aortic caliber at 3.9 cm. Normal heart size, without pericardial effusion. Lad and left circumflex coronary artery calcification.   Mediastinum/Nodes: No supraclavicular adenopathy. No mediastinal or hilar adenopathy.   Lungs/Pleura:  No pleural fluid.   Left apical pleuroparenchymal scarring. Right apical nodule versus area of nodular scarring at 7 mm on 20/4.   Left upper lobe macrolobulated, somewhat branching nodule measures on the order of 1.5 x 1.7 cm on 58/4, relatively similar to the prior PET.   Peripheral irregular anterior left upper lobe/lingular nodule measures 10 x 10 mm on 81/4 and is similar to the prior.   Upper Abdomen: Cholecystectomy. Normal imaged portions of the liver, spleen, stomach, right adrenal gland, left kidney. An incompletely imaged upper pole right renal 7.1 cm fluid density lesion is likely a cyst . In the absence of clinically indicated signs/symptoms require(s) no independent follow-up.   1.2 cm left adrenal nodule measures 38 HU and was not tracer avid on prior PET. This has been present back to 2014 per report and can be presumed an adenoma.   The common duct measures 1.8 cm on 160/3 and is similar back to 10/26/2016.   Musculoskeletal: Moderate to marked T11 compression deformity is new since 12/28/2021  CT.   IMPRESSION: 1. Dominant left upper lobe pulmonary nodules as detailed above. The more cephalad is branching and could represent an endobronchial metastasis or an area of mucoid impaction. The more caudal is irregular/rounded and most likely metastasis or metachronous lung primary. 2. New T11 compression deformity since 2023. 3. Incidental findings, including: Coronary artery atherosclerosis. Aortic Atherosclerosis (ICD10-I70.0). 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     Electronically Signed   By: Jeronimo Greaves M.D.   On: 03/27/2023 10:47 I personally reviewed the CT and Pylarify PET/CT images.  There are 3 lung nodules 1 in the right upper lobe and 2 in the left upper lobe.  All had significant uptake on the Pylarify PET. Coronary and aortic atherosclerosis Left adrenal adenoma  Pulmonary function testing 04/06/2023 FVC 4.55 (104%) FEV1 3.61 (112%) No significant change in bronchodilator DLCO 26.41 (103%)  Impression: Douglas Zhang is a 67 year old man with a remote history of tobacco use, hypertension, hyperlipidemia, reflux, type 2 diabetes without complication, and prostate cancer.  He was diagnosed with prostate cancer in 2012 and treated at that time.   Prostate cancer-treated in 2012.  Now with a biochemical recurrence.  Pylarify PET showed 3 lung nodules with activity.  Lung nodules-2 nodules in the left upper lobe and 1 in the right upper lobe.  Biopsy showed non-small cell carcinoma but  there was not sufficient tissue to distinguish between prostate and lung primary.  However with the elevated PSA, the activity on the Pylarify PET, the absence of any other sites of disease on the PET, it is likely that these represent metastatic prostate cancer.  Cannot completely rule out the possibility that these are lung primaries.  Will review with our radiologist at our next thoracic oncology conference later this week.  Will discuss with Dr. Laverle Patter other potential  treatment options including hormonal therapy or chemotherapy.  He is a surgical candidate and these certainly could be resected.  However the left upper lobe lesions would require lobectomy.  Again he has adequate pulmonary function to tolerate that, but I do want to make sure everyone is on the same page before we go that route.  Plan: Will need to discuss plan with multidisciplinary thoracic oncology team and Dr. Laverle Patter.  Loreli Slot, MD [facility] [phone]

## 2023-04-18 ENCOUNTER — Ambulatory Visit (HOSPITAL_COMMUNITY)
Admission: RE | Admit: 2023-04-18 | Discharge: 2023-04-18 | Disposition: A | Discharge status: Home / Self Care | Payer: Medicare HMO | Source: Ambulatory Visit | Attending: Internal Medicine | Admitting: Internal Medicine

## 2023-04-18 DIAGNOSIS — C3490 Malignant neoplasm of unspecified part of unspecified bronchus or lung: Secondary | ICD-10-CM | POA: Insufficient documentation

## 2023-04-18 MED ORDER — GADOBUTROL 1 MMOL/ML IV SOLN
7.5000 mL | Freq: Once | INTRAVENOUS | Status: AC | PRN
  Administered 2023-04-18: 7.5 mL via INTRAVENOUS

## 2023-04-19 ENCOUNTER — Encounter: Payer: Self-pay | Admitting: *Deleted

## 2023-04-19 ENCOUNTER — Other Ambulatory Visit: Payer: Self-pay | Admitting: *Deleted

## 2023-04-19 ENCOUNTER — Other Ambulatory Visit: Payer: Self-pay

## 2023-04-19 DIAGNOSIS — C3490 Malignant neoplasm of unspecified part of unspecified bronchus or lung: Secondary | ICD-10-CM

## 2023-04-20 NOTE — Progress Notes (Signed)
The proposed treatment discussed in conference is for discussion purpose only and is not a binding recommendation.  The patients have not been physically examined, or presented with their treatment options.  Therefore, final treatment plans cannot be decided.  

## 2023-05-08 NOTE — Pre-Procedure Instructions (Signed)
Surgical Instructions   Your procedure is scheduled on Friday, December 6th. Report to [facility] Main Entrance "A" at 05:30 A.M., then check in with the Admitting office. Any questions or running late day of surgery: call [phone]  Questions prior to your surgery date: call [phone], Monday-Friday, 8am-4pm. If you experience any cold or flu symptoms such as cough, fever, chills, shortness of breath, etc. between now and your scheduled surgery, please notify us at the above number.     Remember:  Do not eat or drink after midnight the night before your surgery     Take these medicines the morning of surgery with A SIP OF WATER  atorvastatin (LIPITOR)  cetirizine (ZYRTEC)  MYRBETRIQ    May take these medicines IF NEEDED: omeprazole (PRILOSEC)  SUMAtriptan (IMITREX)    One week prior to surgery, STOP taking any Aleve, Naproxen, Ibuprofen, Motrin, Advil, Goody's, BC's, all herbal medications, fish oil, and non-prescription vitamins.  WHAT DO I DO ABOUT MY DIABETES MEDICATION?   Do not take glipiZIDE (GLUCOTROL XL) the evening before surgery (12/5) or the morning of surgery (12/6). Do not take metFORMIN (GLUCOPHAGE-XR) the morning of surgery.  STOP TAKING OZEMPIC 7 DAYS PRIOR TO SURGERY. Last dose will be 11/28.      HOW TO MANAGE YOUR DIABETES BEFORE AND AFTER SURGERY  Why is it important to control my blood sugar before and after surgery? Improving blood sugar levels before and after surgery helps healing and can limit problems. A way of improving blood sugar control is eating a healthy diet by:  Eating less sugar and carbohydrates  Increasing activity/exercise  Talking with your doctor about reaching your blood sugar goals High blood sugars (greater than 180 mg/dL) can raise your risk of infections and slow your recovery, so you will need to focus on controlling your diabetes during the weeks before surgery. Make sure that the doctor who takes care of your  diabetes knows about your planned surgery including the date and location.  How do I manage my blood sugar before surgery? Check your blood sugar at least 4 times a day, starting 2 days before surgery, to make sure that the level is not too high or low.  Check your blood sugar the morning of your surgery when you wake up and every 2 hours until you get to the Short Stay unit.  If your blood sugar is less than 70 mg/dL, you will need to treat for low blood sugar: Do not take insulin. Treat a low blood sugar (less than 70 mg/dL) with  cup of clear juice (cranberry or apple), 4 glucose tablets, OR glucose gel. Recheck blood sugar in 15 minutes after treatment (to make sure it is greater than 70 mg/dL). If your blood sugar is not greater than 70 mg/dL on recheck, call [phone] for further instructions. Report your blood sugar to the short stay nurse when you get to Short Stay.  If you are admitted to the hospital after surgery: Your blood sugar will be checked by the staff and you will probably be given insulin after surgery (instead of oral diabetes medicines) to make sure you have good blood sugar levels. The goal for blood sugar control after surgery is 80-180 mg/dL.                     Do NOT Smoke (Tobacco/Vaping) for 24 hours prior to your procedure.  If you use a CPAP at night, you may bring your mask/headgear for your  overnight stay.   You will be asked to remove any contacts, glasses, piercing's, hearing aid's, dentures/partials prior to surgery. Please bring cases for these items if needed.    Patients discharged the day of surgery will not be allowed to drive home, and someone needs to stay with them for 24 hours.  SURGICAL WAITING ROOM VISITATION Patients may have no more than 2 support people in the waiting area - these visitors may rotate.   Pre-op nurse will coordinate an appropriate time for 1 ADULT support person, who may not rotate, to accompany patient in pre-op.   Children under the age of 9 must have an adult with them who is not the patient and must remain in the main waiting area with an adult.  If the patient needs to stay at the hospital during part of their recovery, the visitor guidelines for inpatient rooms apply.  Please refer to the [facility] website for the visitor guidelines for any additional information.   If you received a COVID test during your pre-op visit  it is requested that you wear a mask when out in public, stay away from anyone that may not be feeling well and notify your surgeon if you develop symptoms. If you have been in contact with anyone that has tested positive in the last 10 days please notify you surgeon.      Pre-operative CHG Bathing Instructions   You can play a key role in reducing the risk of infection after surgery. Your skin needs to be as free of germs as possible. You can reduce the number of germs on your skin by washing with CHG (chlorhexidine gluconate) soap before surgery. CHG is an antiseptic soap that kills germs and continues to kill germs even after washing.   DO NOT use if you have an allergy to chlorhexidine/CHG or antibacterial soaps. If your skin becomes reddened or irritated, stop using the CHG and notify one of our RNs at [phone].              TAKE A SHOWER THE NIGHT BEFORE SURGERY AND THE DAY OF SURGERY    Please keep in mind the following:  DO NOT shave, including legs and underarms, 48 hours prior to surgery.   You may shave your face before/day of surgery.  Place clean sheets on your bed the night before surgery Use a clean washcloth (not used since being washed) for each shower. DO NOT sleep with pet's night before surgery.  CHG Shower Instructions:  Wash your face and private area with normal soap. If you choose to wash your hair, wash first with your normal shampoo.  After you use shampoo/soap, rinse your hair and body thoroughly to remove shampoo/soap residue.  Turn the  water OFF and apply half the bottle of CHG soap to a CLEAN washcloth.  Apply CHG soap ONLY FROM YOUR NECK DOWN TO YOUR TOES (washing for 3-5 minutes)  DO NOT use CHG soap on face, private areas, open wounds, or sores.  Pay special attention to the area where your surgery is being performed.  If you are having back surgery, having someone wash your back for you may be helpful. Wait 2 minutes after CHG soap is applied, then you may rinse off the CHG soap.  Pat dry with a clean towel  Put on clean pajamas    Additional instructions for the day of surgery: DO NOT APPLY any lotions, deodorants, cologne, or perfumes.   Do not wear jewelry or makeup  Do not wear nail polish, gel polish, artificial nails, or any other type of covering on natural nails (fingers and toes) Do not bring valuables to the hospital. [facility] is not responsible for valuables/personal belongings. Put on clean/comfortable clothes.  Please brush your teeth.  Ask your nurse before applying any prescription medications to the skin.

## 2023-05-09 ENCOUNTER — Encounter (HOSPITAL_COMMUNITY): Payer: Self-pay

## 2023-05-09 ENCOUNTER — Encounter (HOSPITAL_COMMUNITY)
Admission: RE | Admit: 2023-05-09 | Discharge: 2023-05-09 | Disposition: A | Discharge status: Home / Self Care | Payer: Medicare HMO | Source: Ambulatory Visit | Attending: Thoracic Surgery (Cardiothoracic Vascular Surgery) | Admitting: Thoracic Surgery (Cardiothoracic Vascular Surgery)

## 2023-05-09 ENCOUNTER — Other Ambulatory Visit: Payer: Self-pay

## 2023-05-09 ENCOUNTER — Ambulatory Visit (HOSPITAL_COMMUNITY)
Admission: RE | Admit: 2023-05-09 | Discharge: 2023-05-09 | Disposition: A | Discharge status: Home / Self Care | Payer: Medicare HMO | Source: Ambulatory Visit | Attending: Thoracic Surgery (Cardiothoracic Vascular Surgery) | Admitting: Thoracic Surgery (Cardiothoracic Vascular Surgery)

## 2023-05-09 VITALS — BP 134/90 | HR 85 | Temp 98.0°F | Resp 18 | Ht 69.0 in | Wt 174.0 lb

## 2023-05-09 DIAGNOSIS — E119 Type 2 diabetes mellitus without complications: Secondary | ICD-10-CM | POA: Insufficient documentation

## 2023-05-09 DIAGNOSIS — X58XXXA Exposure to other specified factors, initial encounter: Secondary | ICD-10-CM | POA: Insufficient documentation

## 2023-05-09 DIAGNOSIS — S22080A Wedge compression fracture of T11-T12 vertebra, initial encounter for closed fracture: Secondary | ICD-10-CM | POA: Insufficient documentation

## 2023-05-09 DIAGNOSIS — Z01818 Encounter for other preprocedural examination: Secondary | ICD-10-CM | POA: Insufficient documentation

## 2023-05-09 DIAGNOSIS — R911 Solitary pulmonary nodule: Secondary | ICD-10-CM | POA: Insufficient documentation

## 2023-05-09 DIAGNOSIS — C3490 Malignant neoplasm of unspecified part of unspecified bronchus or lung: Secondary | ICD-10-CM

## 2023-05-09 HISTORY — DX: Personal history of other diseases of the digestive system: Z87.19

## 2023-05-09 HISTORY — DX: Unspecified osteoarthritis, unspecified site: M19.90

## 2023-05-09 LAB — APTT: aPTT: 30 s (ref 24–36)

## 2023-05-09 LAB — COMPREHENSIVE METABOLIC PANEL
ALT: 45 U/L — ABNORMAL HIGH (ref 0–44)
AST: 35 U/L (ref 15–41)
Albumin: 4.1 g/dL (ref 3.5–5.0)
Alkaline Phosphatase: 61 U/L (ref 38–126)
Anion gap: 8 (ref 5–15)
BUN: 29 mg/dL — ABNORMAL HIGH (ref 8–23)
CO2: 26 mmol/L (ref 22–32)
Calcium: 10 mg/dL (ref 8.9–10.3)
Chloride: 104 mmol/L (ref 98–111)
Creatinine, Ser: 1.3 mg/dL — ABNORMAL HIGH (ref 0.61–1.24)
GFR, Estimated: 60 mL/min — ABNORMAL LOW (ref >60)
Glucose, Bld: 111 mg/dL — ABNORMAL HIGH (ref 70–99)
Potassium: 4.5 mmol/L (ref 3.5–5.1)
Sodium: 138 mmol/L (ref 135–145)
Total Bilirubin: 0.8 mg/dL (ref <1.2)
Total Protein: 7 g/dL (ref 6.5–8.1)

## 2023-05-09 LAB — CBC
HCT: 41.4 % (ref 39.0–52.0)
Hemoglobin: 13.7 g/dL (ref 13.0–17.0)
MCH: 29.3 pg (ref 26.0–34.0)
MCHC: 33.1 g/dL (ref 30.0–36.0)
MCV: 88.5 fL (ref 80.0–100.0)
Platelets: 173 10*3/uL (ref 150–400)
RBC: 4.68 MIL/uL (ref 4.22–5.81)
RDW: 13.3 % (ref 11.5–15.5)
WBC: 6.2 10*3/uL (ref 4.0–10.5)
nRBC: 0 % (ref 0.0–0.2)

## 2023-05-09 LAB — URINALYSIS, ROUTINE W REFLEX MICROSCOPIC
Bacteria, UA: NONE SEEN
Bilirubin Urine: NEGATIVE
Glucose, UA: NEGATIVE mg/dL
Hgb urine dipstick: NEGATIVE
Ketones, ur: NEGATIVE mg/dL
Leukocytes,Ua: NEGATIVE
Nitrite: NEGATIVE
Protein, ur: NEGATIVE mg/dL
Specific Gravity, Urine: 1.006 (ref 1.005–1.030)
pH: 6 (ref 5.0–8.0)

## 2023-05-09 LAB — GLUCOSE, CAPILLARY: Glucose-Capillary: 146 mg/dL — ABNORMAL HIGH (ref 70–99)

## 2023-05-09 LAB — PROTIME-INR
INR: 1.2 (ref 0.8–1.2)
Prothrombin Time: 15 s (ref 11.4–15.2)

## 2023-05-09 LAB — TYPE AND SCREEN
ABO/RH(D): O POS
Antibody Screen: NEGATIVE

## 2023-05-09 LAB — HEMOGLOBIN A1C
Hgb A1c MFr Bld: 6.2 % — ABNORMAL HIGH (ref 4.8–5.6)
Mean Plasma Glucose: 131.24 mg/dL

## 2023-05-09 LAB — SURGICAL PCR SCREEN
MRSA, PCR: NEGATIVE
Staphylococcus aureus: POSITIVE — AB

## 2023-05-09 NOTE — Progress Notes (Signed)
PCP - Dr. Paulino Door Cardiologist - denies  PPM/ICD - denies   Chest x-ray - 05/09/23 EKG - 05/09/23 Stress Test - denies ECHO - denies Cardiac Cath - denies  Sleep Study - denies   Fasting Blood Sugar - 100-120 Checks Blood Sugar once a day  Last dose of GLP1 agonist-  04/29/23 GLP1 instructions: Hold 7 days prior to surgery. Pt knows not to take any more doses prior to surgery  Blood Thinner Instructions: n/a Aspirin Instructions: Hold DOS  ERAS Protcol - no, NPO   COVID TEST- 05/09/23   Anesthesia review: yes, pt has MRI of brain that has not been read. MRI was on 04/18/23.  Patient denies shortness of breath, fever, cough and chest pain at PAT appointment   All instructions explained to the patient, with a verbal understanding of the material. Patient agrees to go over the instructions while at home for a better understanding. Patient also instructed to wear a mask in public after being tested for COVID-19. The opportunity to ask questions was provided.

## 2023-05-10 NOTE — Progress Notes (Signed)
COVID test discontinued. I called [employer] and they didn't seem to have any explanation as to why it was cancelled. Darius Bump, RN, notified. She said Dr. Dorris Fetch does not need pt to be COVID tested.

## 2023-05-11 ENCOUNTER — Inpatient Hospital Stay (HOSPITAL_COMMUNITY): Payer: Medicare HMO | Admitting: Physician Assistant

## 2023-05-11 ENCOUNTER — Other Ambulatory Visit: Payer: Self-pay

## 2023-05-11 ENCOUNTER — Inpatient Hospital Stay (HOSPITAL_COMMUNITY)
Admission: RE | Admit: 2023-05-11 | Discharge: 2023-05-17 | DRG: 164 | Disposition: A | Discharge status: Home / Self Care | Payer: Medicare HMO | Attending: Thoracic Surgery (Cardiothoracic Vascular Surgery) | Admitting: Thoracic Surgery (Cardiothoracic Vascular Surgery)

## 2023-05-11 ENCOUNTER — Inpatient Hospital Stay (HOSPITAL_COMMUNITY): Payer: Medicare HMO

## 2023-05-11 ENCOUNTER — Encounter (HOSPITAL_COMMUNITY)
Admission: RE | Disposition: A | Discharge status: Home / Self Care | Payer: Self-pay | Source: Home / Self Care | Attending: Thoracic Surgery (Cardiothoracic Vascular Surgery)

## 2023-05-11 ENCOUNTER — Inpatient Hospital Stay (HOSPITAL_COMMUNITY): Payer: Medicare HMO | Admitting: Certified Registered Nurse Anesthetist

## 2023-05-11 DIAGNOSIS — C7802 Secondary malignant neoplasm of left lung: Secondary | ICD-10-CM | POA: Diagnosis present

## 2023-05-11 DIAGNOSIS — Z79899 Other long term (current) drug therapy: Secondary | ICD-10-CM | POA: Diagnosis not present

## 2023-05-11 DIAGNOSIS — R911 Solitary pulmonary nodule: Secondary | ICD-10-CM | POA: Diagnosis present

## 2023-05-11 DIAGNOSIS — N1831 Chronic kidney disease, stage 3a: Secondary | ICD-10-CM | POA: Diagnosis present

## 2023-05-11 DIAGNOSIS — Z923 Personal history of irradiation: Secondary | ICD-10-CM | POA: Diagnosis not present

## 2023-05-11 DIAGNOSIS — E785 Hyperlipidemia, unspecified: Secondary | ICD-10-CM | POA: Diagnosis present

## 2023-05-11 DIAGNOSIS — C3412 Malignant neoplasm of upper lobe, left bronchus or lung: Secondary | ICD-10-CM

## 2023-05-11 DIAGNOSIS — Z87891 Personal history of nicotine dependence: Secondary | ICD-10-CM

## 2023-05-11 DIAGNOSIS — Z8546 Personal history of malignant neoplasm of prostate: Secondary | ICD-10-CM

## 2023-05-11 DIAGNOSIS — Z9049 Acquired absence of other specified parts of digestive tract: Secondary | ICD-10-CM

## 2023-05-11 DIAGNOSIS — I440 Atrioventricular block, first degree: Secondary | ICD-10-CM | POA: Diagnosis not present

## 2023-05-11 DIAGNOSIS — I4891 Unspecified atrial fibrillation: Secondary | ICD-10-CM | POA: Diagnosis not present

## 2023-05-11 DIAGNOSIS — Z8616 Personal history of COVID-19: Secondary | ICD-10-CM | POA: Diagnosis not present

## 2023-05-11 DIAGNOSIS — Z9889 Other specified postprocedural states: Principal | ICD-10-CM

## 2023-05-11 DIAGNOSIS — Z800 Family history of malignant neoplasm of digestive organs: Secondary | ICD-10-CM

## 2023-05-11 DIAGNOSIS — Z7984 Long term (current) use of oral hypoglycemic drugs: Secondary | ICD-10-CM | POA: Diagnosis not present

## 2023-05-11 DIAGNOSIS — E1122 Type 2 diabetes mellitus with diabetic chronic kidney disease: Secondary | ICD-10-CM | POA: Diagnosis present

## 2023-05-11 DIAGNOSIS — Z7982 Long term (current) use of aspirin: Secondary | ICD-10-CM | POA: Diagnosis not present

## 2023-05-11 DIAGNOSIS — D696 Thrombocytopenia, unspecified: Secondary | ICD-10-CM | POA: Diagnosis not present

## 2023-05-11 DIAGNOSIS — I129 Hypertensive chronic kidney disease with stage 1 through stage 4 chronic kidney disease, or unspecified chronic kidney disease: Secondary | ICD-10-CM | POA: Diagnosis present

## 2023-05-11 DIAGNOSIS — I4892 Unspecified atrial flutter: Secondary | ICD-10-CM | POA: Diagnosis not present

## 2023-05-11 DIAGNOSIS — C3490 Malignant neoplasm of unspecified part of unspecified bronchus or lung: Secondary | ICD-10-CM

## 2023-05-11 DIAGNOSIS — Z7985 Long-term (current) use of injectable non-insulin antidiabetic drugs: Secondary | ICD-10-CM | POA: Diagnosis not present

## 2023-05-11 DIAGNOSIS — Z823 Family history of stroke: Secondary | ICD-10-CM | POA: Diagnosis not present

## 2023-05-11 DIAGNOSIS — E119 Type 2 diabetes mellitus without complications: Secondary | ICD-10-CM

## 2023-05-11 HISTORY — PX: NODE DISSECTION: SHX5269

## 2023-05-11 HISTORY — PX: INTERCOSTAL NERVE BLOCK: SHX5021

## 2023-05-11 LAB — GLUCOSE, CAPILLARY
Glucose-Capillary: 124 mg/dL — ABNORMAL HIGH (ref 70–99)
Glucose-Capillary: 168 mg/dL — ABNORMAL HIGH (ref 70–99)
Glucose-Capillary: 208 mg/dL — ABNORMAL HIGH (ref 70–99)
Glucose-Capillary: 169 mg/dL — ABNORMAL HIGH (ref 70–99)
Glucose-Capillary: 201 mg/dL — ABNORMAL HIGH (ref 70–99)

## 2023-05-11 SURGERY — LOBECTOMY, LUNG, ROBOT-ASSISTED, USING VATS
Anesthesia: General | Site: Chest | Laterality: Left

## 2023-05-11 MED ORDER — FENTANYL CITRATE (PF) 250 MCG/5ML IJ SOLN
INTRAMUSCULAR | Status: DC | PRN
  Administered 2023-05-11: 50 ug via INTRAVENOUS
  Administered 2023-05-11: 150 ug via INTRAVENOUS

## 2023-05-11 MED ORDER — HYDROMORPHONE HCL 1 MG/ML IJ SOLN
INTRAMUSCULAR | Status: DC | PRN
Start: 2023-05-11 — End: 2023-05-11
  Administered 2023-05-11: .25 mg via INTRAVENOUS

## 2023-05-11 MED ORDER — PHENYLEPHRINE 80 MCG/ML (10ML) SYRINGE FOR IV PUSH (FOR BLOOD PRESSURE SUPPORT)
PREFILLED_SYRINGE | INTRAVENOUS | Status: DC | PRN
  Administered 2023-05-11 (×4): 80 ug via INTRAVENOUS

## 2023-05-11 MED ORDER — METHOCARBAMOL 500 MG PO TABS
500.0000 mg | ORAL_TABLET | Freq: Two times a day (BID) | ORAL | Status: DC
  Administered 2023-05-11 – 2023-05-17 (×13): 500 mg via ORAL
  Filled 2023-05-11 (×13): qty 1

## 2023-05-11 MED ORDER — PHENYLEPHRINE HCL-NACL 20-0.9 MG/250ML-% IV SOLN: INTRAVENOUS | Status: DC | PRN

## 2023-05-11 MED ORDER — MIRABEGRON ER 50 MG PO TB24
50.0000 mg | ORAL_TABLET | Freq: Every day | ORAL | Status: DC
Start: 2023-05-12 — End: 2023-05-17
  Administered 2023-05-12 – 2023-05-17 (×6): 50 mg via ORAL
  Filled 2023-05-11 (×6): qty 1

## 2023-05-11 MED ORDER — MONTELUKAST SODIUM 10 MG PO TABS
10.0000 mg | ORAL_TABLET | Freq: Every day | ORAL | Status: DC
  Administered 2023-05-11 – 2023-05-16 (×6): 10 mg via ORAL
  Filled 2023-05-11 (×6): qty 1

## 2023-05-11 MED ORDER — LORATADINE 10 MG PO TABS
10.0000 mg | ORAL_TABLET | Freq: Every day | ORAL | Status: DC
  Administered 2023-05-12 – 2023-05-17 (×6): 10 mg via ORAL
  Filled 2023-05-11 (×6): qty 1

## 2023-05-11 MED ORDER — ATORVASTATIN CALCIUM 40 MG PO TABS
40.0000 mg | ORAL_TABLET | Freq: Every day | ORAL | Status: DC
  Administered 2023-05-12 – 2023-05-17 (×6): 40 mg via ORAL
  Filled 2023-05-11 (×6): qty 1

## 2023-05-11 MED ORDER — LACTATED RINGERS IV SOLN: INTRAVENOUS | Status: DC

## 2023-05-11 MED ORDER — ACETAMINOPHEN 160 MG/5ML PO SOLN: 1000.0000 mg | Freq: Four times a day (QID) | ORAL | Status: AC

## 2023-05-11 MED ORDER — INSULIN ASPART 100 UNIT/ML IJ SOLN
0.0000 [IU] | Freq: Four times a day (QID) | INTRAMUSCULAR | Status: DC
  Administered 2023-05-11: 8 [IU] via SUBCUTANEOUS

## 2023-05-11 MED ORDER — LIDOCAINE 2% (20 MG/ML) 5 ML SYRINGE
INTRAMUSCULAR | Status: DC | PRN
  Administered 2023-05-11: 60 mg via INTRAVENOUS

## 2023-05-11 MED ORDER — FENTANYL CITRATE (PF) 100 MCG/2ML IJ SOLN
INTRAMUSCULAR | Status: AC
  Administered 2023-05-11: 25 ug via INTRAVENOUS
  Filled 2023-05-11: qty 2

## 2023-05-11 MED ORDER — PROPOFOL 10 MG/ML IV BOLUS
INTRAVENOUS | Status: AC
  Filled 2023-05-11: qty 20

## 2023-05-11 MED ORDER — ACETAMINOPHEN 10 MG/ML IV SOLN: 1000.0000 mg | Freq: Once | INTRAVENOUS | Status: DC | PRN

## 2023-05-11 MED ORDER — ACETAMINOPHEN 10 MG/ML IV SOLN
INTRAVENOUS | Status: DC | PRN
  Administered 2023-05-11: 1000 mg via INTRAVENOUS

## 2023-05-11 MED ORDER — SODIUM CHLORIDE 0.9 % IV SOLN: INTRAVENOUS | Status: AC

## 2023-05-11 MED ORDER — CEFAZOLIN SODIUM-DEXTROSE 2-4 GM/100ML-% IV SOLN
INTRAVENOUS | Status: AC
  Filled 2023-05-11: qty 100

## 2023-05-11 MED ORDER — MIDAZOLAM HCL 2 MG/2ML IJ SOLN
INTRAMUSCULAR | Status: AC
  Filled 2023-05-11: qty 2

## 2023-05-11 MED ORDER — LACTATED RINGERS IV SOLN: INTRAVENOUS | Status: DC | PRN

## 2023-05-11 MED ORDER — 0.9 % SODIUM CHLORIDE (POUR BTL) OPTIME
TOPICAL | Status: DC | PRN
  Administered 2023-05-11: 1000 mL

## 2023-05-11 MED ORDER — ONDANSETRON HCL 4 MG/2ML IJ SOLN
4.0000 mg | Freq: Four times a day (QID) | INTRAMUSCULAR | Status: DC | PRN
Start: 2023-05-11 — End: 2023-05-17

## 2023-05-11 MED ORDER — OXYCODONE HCL 5 MG PO TABS
ORAL_TABLET | ORAL | Status: AC
  Administered 2023-05-11: 5 mg via ORAL
  Filled 2023-05-11: qty 1

## 2023-05-11 MED ORDER — HEMOSTATIC AGENTS (NO CHARGE) OPTIME
TOPICAL | Status: DC | PRN
Start: 2023-05-11 — End: 2023-05-11
  Administered 2023-05-11: 2 via TOPICAL

## 2023-05-11 MED ORDER — CEFAZOLIN SODIUM-DEXTROSE 2-4 GM/100ML-% IV SOLN
2.0000 g | Freq: Three times a day (TID) | INTRAVENOUS | Status: AC
  Administered 2023-05-11 – 2023-05-12 (×2): 2 g via INTRAVENOUS
  Filled 2023-05-11 (×2): qty 100

## 2023-05-11 MED ORDER — METHOCARBAMOL 500 MG PO TABS
ORAL_TABLET | ORAL | Status: AC
  Filled 2023-05-11: qty 1

## 2023-05-11 MED ORDER — TRIAMCINOLONE ACETONIDE 0.1 % EX OINT
TOPICAL_OINTMENT | Freq: Two times a day (BID) | CUTANEOUS | Status: DC
  Filled 2023-05-11 (×2): qty 15

## 2023-05-11 MED ORDER — MIDAZOLAM HCL 2 MG/2ML IJ SOLN
INTRAMUSCULAR | Status: DC | PRN
  Administered 2023-05-11 (×2): 1 mg via INTRAVENOUS

## 2023-05-11 MED ORDER — DEXAMETHASONE SODIUM PHOSPHATE 10 MG/ML IJ SOLN
INTRAMUSCULAR | Status: AC
  Filled 2023-05-11: qty 1

## 2023-05-11 MED ORDER — ORAL CARE MOUTH RINSE: 15.0000 mL | Freq: Once | OROMUCOSAL | Status: AC

## 2023-05-11 MED ORDER — CHLORHEXIDINE GLUCONATE 0.12 % MT SOLN
15.0000 mL | Freq: Once | OROMUCOSAL | Status: AC
Start: 2023-05-11 — End: 2023-05-11

## 2023-05-11 MED ORDER — SENNOSIDES-DOCUSATE SODIUM 8.6-50 MG PO TABS
1.0000 | ORAL_TABLET | Freq: Every day | ORAL | Status: DC
Start: 2023-05-11 — End: 2023-05-17
  Administered 2023-05-12 – 2023-05-14 (×3): 1 via ORAL
  Filled 2023-05-11 (×4): qty 1

## 2023-05-11 MED ORDER — PHENYLEPHRINE HCL-NACL 20-0.9 MG/250ML-% IV SOLN
INTRAVENOUS | Status: DC | PRN
  Administered 2023-05-11: 50 ug/min via INTRAVENOUS

## 2023-05-11 MED ORDER — OXYCODONE HCL 5 MG PO TABS
5.0000 mg | ORAL_TABLET | ORAL | Status: DC | PRN
  Administered 2023-05-11 – 2023-05-12 (×3): 10 mg via ORAL
  Administered 2023-05-12 – 2023-05-13 (×2): 5 mg via ORAL
  Administered 2023-05-13 – 2023-05-15 (×8): 10 mg via ORAL
  Administered 2023-05-16 (×2): 5 mg via ORAL
  Filled 2023-05-11 (×3): qty 2
  Filled 2023-05-11: qty 1
  Filled 2023-05-11: qty 2
  Filled 2023-05-11 (×2): qty 1
  Filled 2023-05-11 (×3): qty 2
  Filled 2023-05-11: qty 1
  Filled 2023-05-11 (×4): qty 2
  Filled 2023-05-11: qty 1

## 2023-05-11 MED ORDER — PANTOPRAZOLE SODIUM 40 MG PO TBEC
40.0000 mg | DELAYED_RELEASE_TABLET | Freq: Every day | ORAL | Status: DC
  Administered 2023-05-12 – 2023-05-17 (×6): 40 mg via ORAL
  Filled 2023-05-11 (×6): qty 1

## 2023-05-11 MED ORDER — ROCURONIUM BROMIDE 10 MG/ML (PF) SYRINGE
PREFILLED_SYRINGE | INTRAVENOUS | Status: DC | PRN
  Administered 2023-05-11 (×2): 20 mg via INTRAVENOUS
  Administered 2023-05-11: 60 mg via INTRAVENOUS

## 2023-05-11 MED ORDER — ACETAMINOPHEN 10 MG/ML IV SOLN
INTRAVENOUS | Status: AC
Start: 2023-05-11 — End: ?
  Filled 2023-05-11: qty 100

## 2023-05-11 MED ORDER — AMISULPRIDE (ANTIEMETIC) 5 MG/2ML IV SOLN: 10.0000 mg | Freq: Once | INTRAVENOUS | Status: DC | PRN

## 2023-05-11 MED ORDER — SODIUM CHLORIDE FLUSH 0.9 % IV SOLN
INTRAVENOUS | Status: DC | PRN
  Administered 2023-05-11: 100 mL

## 2023-05-11 MED ORDER — CHLORHEXIDINE GLUCONATE 0.12 % MT SOLN
OROMUCOSAL | Status: AC
  Administered 2023-05-11: 15 mL via OROMUCOSAL
  Filled 2023-05-11: qty 15

## 2023-05-11 MED ORDER — DEXAMETHASONE SODIUM PHOSPHATE 10 MG/ML IJ SOLN
INTRAMUSCULAR | Status: DC | PRN
  Administered 2023-05-11: 10 mg via INTRAVENOUS

## 2023-05-11 MED ORDER — MAGNESIUM OXIDE 400 MG PO TABS
800.0000 mg | ORAL_TABLET | Freq: Every day | ORAL | Status: DC
  Administered 2023-05-12 – 2023-05-17 (×6): 800 mg via ORAL
  Filled 2023-05-11 (×7): qty 2

## 2023-05-11 MED ORDER — INSULIN ASPART 100 UNIT/ML IJ SOLN
0.0000 [IU] | Freq: Three times a day (TID) | INTRAMUSCULAR | Status: DC
  Administered 2023-05-12 (×2): 3 [IU] via SUBCUTANEOUS
  Administered 2023-05-12: 2 [IU] via SUBCUTANEOUS
  Administered 2023-05-13: 3 [IU] via SUBCUTANEOUS
  Administered 2023-05-13: 2 [IU] via SUBCUTANEOUS
  Administered 2023-05-14 – 2023-05-15 (×4): 3 [IU] via SUBCUTANEOUS
  Administered 2023-05-15 – 2023-05-16 (×4): 2 [IU] via SUBCUTANEOUS
  Administered 2023-05-16 – 2023-05-17 (×2): 3 [IU] via SUBCUTANEOUS

## 2023-05-11 MED ORDER — ONDANSETRON HCL 4 MG/2ML IJ SOLN
INTRAMUSCULAR | Status: AC
  Filled 2023-05-11: qty 2

## 2023-05-11 MED ORDER — SUGAMMADEX SODIUM 200 MG/2ML IV SOLN
INTRAVENOUS | Status: DC | PRN
  Administered 2023-05-11: 200 mg via INTRAVENOUS

## 2023-05-11 MED ORDER — FENTANYL CITRATE PF 50 MCG/ML IJ SOSY
25.0000 ug | PREFILLED_SYRINGE | INTRAMUSCULAR | Status: DC | PRN
  Administered 2023-05-14 (×2): 50 ug via INTRAVENOUS
  Filled 2023-05-11 (×2): qty 1

## 2023-05-11 MED ORDER — LIDOCAINE 2% (20 MG/ML) 5 ML SYRINGE
INTRAMUSCULAR | Status: AC
  Filled 2023-05-11: qty 5

## 2023-05-11 MED ORDER — KETOROLAC TROMETHAMINE 15 MG/ML IJ SOLN
15.0000 mg | Freq: Four times a day (QID) | INTRAMUSCULAR | Status: DC
  Administered 2023-05-11 – 2023-05-13 (×7): 15 mg via INTRAVENOUS
  Filled 2023-05-11 (×7): qty 1

## 2023-05-11 MED ORDER — ENOXAPARIN SODIUM 40 MG/0.4ML IJ SOSY
40.0000 mg | PREFILLED_SYRINGE | INTRAMUSCULAR | Status: DC
Start: 2023-05-12 — End: 2023-05-17
  Administered 2023-05-12 – 2023-05-17 (×6): 40 mg via SUBCUTANEOUS
  Filled 2023-05-11 (×6): qty 0.4

## 2023-05-11 MED ORDER — ONDANSETRON HCL 4 MG/2ML IJ SOLN
INTRAMUSCULAR | Status: DC | PRN
  Administered 2023-05-11: 4 mg via INTRAVENOUS

## 2023-05-11 MED ORDER — GABAPENTIN 300 MG PO CAPS
300.0000 mg | ORAL_CAPSULE | Freq: Every day | ORAL | Status: DC
  Administered 2023-05-11 – 2023-05-16 (×6): 300 mg via ORAL
  Filled 2023-05-11 (×6): qty 1

## 2023-05-11 MED ORDER — MAGNESIUM OXIDE 400 MG PO TABS: 800.0000 mg | ORAL_TABLET | Freq: Every day | ORAL | Status: DC

## 2023-05-11 MED ORDER — HYDROMORPHONE HCL 1 MG/ML IJ SOLN
INTRAMUSCULAR | Status: AC
  Filled 2023-05-11: qty 0.5

## 2023-05-11 MED ORDER — FENTANYL CITRATE (PF) 250 MCG/5ML IJ SOLN
INTRAMUSCULAR | Status: AC
  Filled 2023-05-11: qty 5

## 2023-05-11 MED ORDER — BUPIVACAINE HCL (PF) 0.5 % IJ SOLN
INTRAMUSCULAR | Status: AC
Start: 2023-05-11 — End: ?
  Filled 2023-05-11: qty 30

## 2023-05-11 MED ORDER — PHENYLEPHRINE 80 MCG/ML (10ML) SYRINGE FOR IV PUSH (FOR BLOOD PRESSURE SUPPORT)
PREFILLED_SYRINGE | INTRAVENOUS | Status: AC
  Filled 2023-05-11: qty 10

## 2023-05-11 MED ORDER — CEFAZOLIN SODIUM-DEXTROSE 2-4 GM/100ML-% IV SOLN
2.0000 g | INTRAVENOUS | Status: AC
  Administered 2023-05-11: 2 g via INTRAVENOUS

## 2023-05-11 MED ORDER — BISACODYL 5 MG PO TBEC
10.0000 mg | DELAYED_RELEASE_TABLET | Freq: Every day | ORAL | Status: DC
  Administered 2023-05-11 – 2023-05-14 (×4): 10 mg via ORAL
  Filled 2023-05-11 (×4): qty 2

## 2023-05-11 MED ORDER — ASPIRIN 81 MG PO TBEC
81.0000 mg | DELAYED_RELEASE_TABLET | Freq: Every day | ORAL | Status: DC
  Administered 2023-05-12 – 2023-05-17 (×6): 81 mg via ORAL
  Filled 2023-05-11 (×6): qty 1

## 2023-05-11 MED ORDER — FENTANYL CITRATE (PF) 100 MCG/2ML IJ SOLN
25.0000 ug | INTRAMUSCULAR | Status: DC | PRN
  Administered 2023-05-11: 50 ug via INTRAVENOUS

## 2023-05-11 MED ORDER — KETOROLAC TROMETHAMINE 15 MG/ML IJ SOLN
INTRAMUSCULAR | Status: AC
  Filled 2023-05-11: qty 1

## 2023-05-11 MED ORDER — BUPIVACAINE LIPOSOME 1.3 % IJ SUSP
INTRAMUSCULAR | Status: AC
  Filled 2023-05-11: qty 20

## 2023-05-11 MED ORDER — ROCURONIUM BROMIDE 10 MG/ML (PF) SYRINGE
PREFILLED_SYRINGE | INTRAVENOUS | Status: AC
  Filled 2023-05-11: qty 10

## 2023-05-11 MED ORDER — PROPOFOL 10 MG/ML IV BOLUS
INTRAVENOUS | Status: DC | PRN
  Administered 2023-05-11: 100 mg via INTRAVENOUS

## 2023-05-11 MED ORDER — ONDANSETRON HCL 4 MG/2ML IJ SOLN: 4.0000 mg | Freq: Once | INTRAMUSCULAR | Status: DC | PRN

## 2023-05-11 MED ORDER — ACETAMINOPHEN 500 MG PO TABS
1000.0000 mg | ORAL_TABLET | Freq: Four times a day (QID) | ORAL | Status: AC
  Administered 2023-05-11 – 2023-05-16 (×17): 1000 mg via ORAL
  Filled 2023-05-11 (×17): qty 2

## 2023-05-11 SURGICAL SUPPLY — 78 items
BLADE CLIPPER SURG (BLADE) IMPLANT
CANISTER SUCT 3000ML PPV (MISCELLANEOUS) ×2 IMPLANT
CANNULA REDUCER 12-8 DVNC XI (CANNULA) ×2 IMPLANT
CATH THORACIC 28FR (CATHETERS) IMPLANT
CNTNR URN SCR LID CUP LEK RST (MISCELLANEOUS) ×5 IMPLANT
DEFOGGER SCOPE WARMER CLEARIFY (MISCELLANEOUS) ×1 IMPLANT
DERMABOND ADVANCED .7 DNX12 (GAUZE/BANDAGES/DRESSINGS) ×1 IMPLANT
DRAIN CHANNEL 28F RND 3/8 FF (WOUND CARE) IMPLANT
DRAIN CHANNEL 32F RND 10.7 FF (WOUND CARE) IMPLANT
DRAPE ARM DVNC X/XI (DISPOSABLE) ×4 IMPLANT
DRAPE COLUMN DVNC XI (DISPOSABLE) ×1 IMPLANT
DRAPE CV SPLIT W-CLR ANES SCRN (DRAPES) ×1 IMPLANT
DRAPE HALF SHEET 40X57 (DRAPES) ×1 IMPLANT
DRAPE INCISE IOBAN 66X45 STRL (DRAPES) IMPLANT
DRAPE SURG ORHT 6 SPLT 77X108 (DRAPES) ×1 IMPLANT
ELECT BLADE 6.5 EXT (BLADE) ×1 IMPLANT
ELECT REM PT RETURN 9FT ADLT (ELECTROSURGICAL) ×1 IMPLANT
ELECTRODE REM PT RTRN 9FT ADLT (ELECTROSURGICAL) ×1 IMPLANT
FORCEPS BPLR FENES DVNC XI (FORCEP) IMPLANT
FORCEPS BPLR LNG DVNC XI (INSTRUMENTS) IMPLANT
GAUZE KITTNER 4X5 RF (MISCELLANEOUS) ×2 IMPLANT
GAUZE SPONGE 4X4 12PLY STRL (GAUZE/BANDAGES/DRESSINGS) ×1 IMPLANT
GLOVE SS BIOGEL STRL SZ 7.5 (GLOVE) ×2 IMPLANT
GLOVE SURG POLYISO LF SZ8 (GLOVE) ×1 IMPLANT
GOWN STRL REUS W/ TWL LRG LVL3 (GOWN DISPOSABLE) ×2 IMPLANT
GOWN STRL REUS W/ TWL XL LVL3 (GOWN DISPOSABLE) ×2 IMPLANT
GOWN STRL REUS W/TWL 2XL LVL3 (GOWN DISPOSABLE) ×1 IMPLANT
GRASPER TIP-UP FEN DVNC XI (INSTRUMENTS) IMPLANT
HEMOSTAT SURGICEL 2X14 (HEMOSTASIS) ×3 IMPLANT
IRRIGATION STRYKERFLOW (MISCELLANEOUS) ×1 IMPLANT
IRRIGATOR STRYKERFLOW (MISCELLANEOUS) ×1 IMPLANT
KIT BASIN OR (CUSTOM PROCEDURE TRAY) ×1 IMPLANT
KIT SUCTION CATH 14FR (SUCTIONS) IMPLANT
KIT TURNOVER KIT B (KITS) ×1 IMPLANT
NDL HYPO 25GX1X1/2 BEV (NEEDLE) ×1 IMPLANT
NDL SPNL 22GX3.5 QUINCKE BK (NEEDLE) ×1 IMPLANT
NEEDLE HYPO 25GX1X1/2 BEV (NEEDLE) ×1 IMPLANT
NEEDLE SPNL 22GX3.5 QUINCKE BK (NEEDLE) ×1 IMPLANT
NS IRRIG 1000ML POUR BTL (IV SOLUTION) ×2 IMPLANT
PACK CHEST (CUSTOM PROCEDURE TRAY) ×1 IMPLANT
PAD ARMBOARD 7.5X6 YLW CONV (MISCELLANEOUS) ×2 IMPLANT
PORT ACCESS TROCAR AIRSEAL 12 (TROCAR) ×1 IMPLANT
RELOAD STAPLE 45 2.5 WHT DVNC (STAPLE) IMPLANT
RELOAD STAPLE 45 3.5 BLU DVNC (STAPLE) IMPLANT
RELOAD STAPLE 45 4.3 GRN DVNC (STAPLE) IMPLANT
RELOAD STAPLER 2.5X45 WHT DVNC (STAPLE) ×5 IMPLANT
RELOAD STAPLER 3.5X45 BLU DVNC (STAPLE) ×1 IMPLANT
RELOAD STAPLER 4.3X45 GRN DVNC (STAPLE) ×3 IMPLANT
SCISSORS LAP 5X35 DISP (ENDOMECHANICALS) IMPLANT
SEAL UNIV 5-12 XI (MISCELLANEOUS) ×4 IMPLANT
SET TRI-LUMEN FLTR TB AIRSEAL (TUBING) ×1 IMPLANT
SOL ELECTROSURG ANTI STICK (MISCELLANEOUS) ×1 IMPLANT
SOLUTION ELECTROSURG ANTI STCK (MISCELLANEOUS) ×1 IMPLANT
SPONGE INTESTINAL PEANUT (DISPOSABLE) IMPLANT
SPONGE TONSIL 1 RF SGL (DISPOSABLE) IMPLANT
STAPLER 45 SUREFORM CVD DVNC (STAPLE) IMPLANT
STAPLER RELOAD 2.5X45 WHT DVNC (STAPLE) ×5 IMPLANT
STAPLER RELOAD 3.5X45 BLU DVNC (STAPLE) ×1 IMPLANT
STAPLER RELOAD 4.3X45 GRN DVNC (STAPLE) ×3 IMPLANT
SUT PROLENE 4-0 RB1 .5 CRCL 36 (SUTURE) IMPLANT
SUT SILK 1 MH (SUTURE) ×1 IMPLANT
SUT SILK 2 0 SH (SUTURE) ×1 IMPLANT
SUT SILK 2 0SH CR/8 30 (SUTURE) IMPLANT
SUT SILK 3 0SH CR/8 30 (SUTURE) IMPLANT
SUT VIC AB 1 CTX36XBRD ANBCTR (SUTURE) ×1 IMPLANT
SUT VIC AB 2-0 CTX 36 (SUTURE) ×1 IMPLANT
SUT VIC AB 3-0 X1 27 (SUTURE) ×2 IMPLANT
SUT VICRYL 0 TIES 12 18 (SUTURE) ×1 IMPLANT
SUT VICRYL 0 UR6 27IN ABS (SUTURE) ×2 IMPLANT
SUT VICRYL 2 TP 1 (SUTURE) IMPLANT
SYR 20CC LL (SYRINGE) ×2 IMPLANT
SYSTEM RETRIEVAL ANCHOR 15 (MISCELLANEOUS) IMPLANT
SYSTEM SAHARA CHEST DRAIN ATS (WOUND CARE) ×1 IMPLANT
TAPE CLOTH 4X10 WHT NS (GAUZE/BANDAGES/DRESSINGS) ×1 IMPLANT
TAPE CLOTH SURG 4X10 WHT LF (GAUZE/BANDAGES/DRESSINGS) IMPLANT
TOWEL GREEN STERILE (TOWEL DISPOSABLE) ×1 IMPLANT
TRAY FOLEY MTR SLVR 16FR STAT (SET/KITS/TRAYS/PACK) ×1 IMPLANT
WATER STERILE IRR 1000ML POUR (IV SOLUTION) ×2 IMPLANT

## 2023-05-11 NOTE — Hospital Course (Addendum)
HPI: This is a 67 year old man with a remote history of tobacco use, hypertension, hyperlipidemia, reflux, type 2 diabetes without complication, and prostate cancer.  He was diagnosed with prostate cancer in 2012 and treated at that time.  He recently saw Dr. Laverle Patter.  PSA was elevated at 5 consistent with a biochemical recurrence.  He then had a Pylarify PET which showed 3 lung nodules all of which had significant activity.   He underwent navigational bronchoscopy by Dr. Delton Coombes.  Biopsies of the central left upper lobe nodule showed non-small cell carcinoma, but there was insufficient tissue to determine if this was lung or prostate primary.  Biopsies from the peripheral left upper lobe nodule were suspicious.  Biopsies from the right upper lobe nodule were insufficient.   He feels well.  He smoked less than a pack a day for less than 10 years prior to quitting in 1985.  He denies any chest pain, pressure, tightness, shortness of breath.  He is active.  He is retired.  No change in appetite or weight loss.  No headaches or visual changes.  After discussion with Dr. Laverle Patter and radiology at thoracic oncology conference, it was decided to proceed with robotic assisted left upper lobectomy. Dr. Dorris Fetch discussed with patient potential risks, complications, and benefits of the surgery. Patient was agreeable to proceed with surgery.  Hospital Course: Patient underwent an Xi assisted left thoracoscopy, LUL, LN dissection, and intercostal nerve block. He was extubated and transported from the OR to PACU in stable condition. Chest tube was placed to water seal. Daily chest x rays were obtained and remained stable. He has a history of diabetes. Glipizide and Metformin will be restarted later in his hospital course. His creatinine prior to surgery was slightly elevated at 1.3. He was given IVF and low-dose Toradol.  His creatinine did bump a bit and the Toradol has been discontinued.  He was on hydrochlorothiazide  and Valsartan prior to surgery and as long as creatinine is stable, these will be restarted as BP allows post op.  On postop day 1 he had moderate chest tube drainage and no airleak and tube was kept in place.  On postop day #2 he did have a moderate-sized airleak and continued to have moderate drainage.  It is serosanguineous and he does have some drainage from around the chest tube site and port incisions.  There is no evidence of infection.  Chest x-ray has been fairly stable in appearance with some consolidation/atelectasis. He had a small, intermittent air leak with cough on 12/09. This did resolve. Chest was removed on 12/10. Same day chest x ray showed no pneumothorax. Follow up chest x ray showed  no pneumothorax and volume loss within left hemithorax. He has been ambulating on room air with good oxygenation. All wounds are clean, dry healing without signs of infection. Posterior wound dehisced as tape placed on wound which removed the derma bond. There is a fair amount of ecchymosis left lateral chest and upper thigh. He has been tolerating a diet. He had a fib/flutter and HR in the 110's post op day 4/5. EKG was obtained, he was given IV Lopressor 2.5 mg once, Amiodarone 150 mg IV bolus, started on Toprol XL 25 mg daily and oral Amiodarone 400 mg bid. In addition, because SBP in the 130's+, low dose Valsartan was restarted. Repeat BMET done 12/11 showed potassium 5 (given Lasix) and creatinine 1.32. He is maintaining sinus rhythm. PA/LAT CXR done 12/12 was stable. As discussed with Dr.  Dorris Fetch, patient is felt stable for discharge today.

## 2023-05-11 NOTE — Anesthesia Procedure Notes (Signed)
Arterial Line Insertion Start/End12/11/2022 7:00 AM, 05/11/2023 7:10 AM Performed by: Leonides Grills, MD, CRNA  Patient location: OOR procedure area. Preanesthetic checklist: patient identified, IV checked, site marked, risks and benefits discussed, surgical consent, monitors and equipment checked, pre-op evaluation, timeout performed and anesthesia consent Lidocaine 1% used for infiltration Left, radial was placed Catheter size: 20 G Hand hygiene performed   Attempts: 1 Procedure performed without using ultrasound guided technique. Following insertion, dressing applied and Biopatch. Post procedure assessment: normal  Patient tolerated the procedure well with no immediate complications. Additional procedure comments: Performed by Adrienne Mocha.

## 2023-05-11 NOTE — Brief Op Note (Signed)
05/11/2023  10:13 AM  PATIENT:  Douglas Zhang  67 y.o. male  PRE-OPERATIVE DIAGNOSIS:  Left Upper Lobe Lung NODULES, HISTORY of PROSTATE CANCER  POST-OPERATIVE DIAGNOSIS:  Left Upper Lobe Lung NODULES, HISTORY of PROSTATE CANCER  PROCEDURE:  XI ROBOTIC ASSISTED LEFT THORACOSCOPY, LEFT UPPER LOBECTOMY , LYMPH NODE DISSECTION, and  INTERCOSTAL NERVE BLOCK (levels 3 through 10)  SURGEON:  Surgeons and Role:    Loreli Slot, MD - Primary  PHYSICIAN ASSISTANT: Doree Fudge PA-C  ANESTHESIA:   general  EBL:  20 mL   BLOOD ADMINISTERED:none  DRAINS:  28 Blake drain placed in the left pleural space    LOCAL MEDICATIONS USED:  OTHER Exparel  SPECIMEN:  Source of Specimen:  LUL and multiple lymph nodes  DISPOSITION OF SPECIMEN:  PATHOLOGY  COUNTS CORRECT:  YES  DICTATION: .Dragon Dictation  PLAN OF CARE: Admit to inpatient   PATIENT DISPOSITION:  PACU - hemodynamically stable.   Delay start of Pharmacological VTE agent (>24hrs) due to surgical blood loss or risk of bleeding: no

## 2023-05-11 NOTE — Anesthesia Procedure Notes (Signed)
Procedure Name: Intubation Date/Time: 05/11/2023 7:34 AM  Performed by: Georgianne Fick D, CRNAPre-anesthesia Checklist: Patient identified, Emergency Drugs available, Suction available and Patient being monitored Patient Re-evaluated:Patient Re-evaluated prior to induction Oxygen Delivery Method: Circle System Utilized Preoxygenation: Pre-oxygenation with 100% oxygen Induction Type: IV induction Ventilation: Mask ventilation without difficulty and Oral airway inserted - appropriate to patient size Laryngoscope Size: Mac and 3 Grade View: Grade I Tube type: Oral Endobronchial tube: Left and Double lumen EBT and 39 Fr Number of attempts: 1 Airway Equipment and Method: Stylet and Oral airway Placement Confirmation: ETT inserted through vocal cords under direct vision, positive ETCO2 and breath sounds checked- equal and bilateral Tube secured with: Tape Dental Injury: Teeth and Oropharynx as per pre-operative assessment

## 2023-05-11 NOTE — Anesthesia Preprocedure Evaluation (Addendum)
Anesthesia Evaluation  Patient identified by MRN, date of birth, ID band Patient awake    Reviewed: Allergy & Precautions, NPO status , Patient's Chart, lab work & pertinent test results  Airway Mallampati: II  TM Distance: >3 FB Neck ROM: Full    Dental  (+) Edentulous Upper, Missing   Pulmonary former smoker Metastasis to lung Prostate cancer     Pulmonary exam normal        Cardiovascular hypertension, Pt. on medications Normal cardiovascular exam     Neuro/Psych  Headaches  negative psych ROS   GI/Hepatic Neg liver ROS, hiatal hernia,GERD  Medicated and Controlled,,  Endo/Other  diabetes, Oral Hypoglycemic Agents  Patient on GLP-1 Agonist  Renal/GU Renal disease     Musculoskeletal  (+) Arthritis ,    Abdominal   Peds  Hematology negative hematology ROS (+)   Anesthesia Other Findings LUL NODULES  Reproductive/Obstetrics                             Anesthesia Physical Anesthesia Plan  ASA: 3  Anesthesia Plan: General   Post-op Pain Management:    Induction: Intravenous  PONV Risk Score and Plan: 2 and Ondansetron, Dexamethasone, Midazolam and Treatment may vary due to age or medical condition  Airway Management Planned: Double Lumen EBT  Additional Equipment: Arterial line  Intra-op Plan:   Post-operative Plan: Extubation in OR  Informed Consent: I have reviewed the patients History and Physical, chart, labs and discussed the procedure including the risks, benefits and alternatives for the proposed anesthesia with the patient or authorized representative who has indicated his/her understanding and acceptance.     Dental advisory given  Plan Discussed with: CRNA  Anesthesia Plan Comments: (Potential central line placement discussed )        Anesthesia Quick Evaluation

## 2023-05-11 NOTE — Transfer of Care (Signed)
Immediate Anesthesia Transfer of Care Note  Patient: Douglas Zhang  Procedure(s) Performed: XI ROBOTIC ASSISTED THORACOSCOPY-LEFT UPPER LOBECTOMY (Left: Chest) NODE DISSECTION (Left: Chest) INTERCOSTAL NERVE BLOCK (Left: Chest)  Patient Location: PACU  Anesthesia Type:General  Level of Consciousness: awake and alert   Airway & Oxygen Therapy: Patient Spontanous Breathing and Patient connected to face mask oxygen  Post-op Assessment: Report given to RN and Post -op Vital signs reviewed and stable  Post vital signs: Reviewed and stable  Last Vitals:  Vitals Value Taken Time  BP 129/76 05/11/23 1032  Temp    Pulse 75 05/11/23 1033  Resp 16 05/11/23 1033  SpO2 99 % 05/11/23 1033  Vitals shown include unfiled device data.  Last Pain:  Vitals:   05/11/23 0603  TempSrc: Oral         Complications: No notable events documented.

## 2023-05-11 NOTE — Discharge Summary (Addendum)
[address]       [place] [address]             [phone]    Physician Discharge Summary  Patient ID: Douglas Zhang MRN: [id number] DOB/AGE: [birth year] 67 y.o.  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  He was diagnosed with prostate cancer in 2012 and treated at that time.  He recently saw Dr. Laverle Patter.  PSA was elevated at 5 consistent with a biochemical recurrence.  He then had a Pylarify PET which showed 3 lung nodules all of which had significant activity.   He underwent navigational bronchoscopy by Dr. Delton Coombes.  Biopsies of the central left upper lobe nodule showed non-small cell carcinoma, but there was insufficient tissue to determine if this was lung or prostate primary.  Biopsies from the peripheral left upper lobe nodule were suspicious.  Biopsies from the right upper lobe nodule were insufficient.   He feels well.  He smoked less than a pack a day for less than 10 years prior to quitting in 1985.  He denies any chest pain, pressure, tightness, shortness of breath.   He is active.  He is retired.  No change in appetite or weight loss.  No headaches or visual changes.  After discussion with Dr. Laverle Patter and radiology at thoracic oncology conference, it was decided to proceed with robotic assisted left upper lobectomy. Dr. Dorris Fetch discussed with patient potential risks, complications, and benefits of the surgery. Patient was agreeable to proceed with surgery.  Hospital Course: Patient underwent an Xi assisted left thoracoscopy, LUL, LN dissection, and intercostal nerve block. He was extubated and transported from the OR to PACU in stable condition. Chest tube was placed to water seal. Daily chest x rays were obtained and remained stable. He has a history of diabetes. Glipizide and Metformin will be restarted later in his hospital course. His creatinine prior to surgery was slightly elevated at 1.3. He was given IVF and low-dose Toradol.  His creatinine did bump a bit and the Toradol has been discontinued.  He was on hydrochlorothiazide and Valsartan prior to surgery and as long as creatinine is stable, these will be restarted as BP allows post op.  On postop day 1 he had moderate chest tube drainage and no airleak and tube was kept in place.  On postop day #2 he did have a moderate-sized airleak and continued to have moderate drainage.  It is serosanguineous and he does have some drainage from around the chest tube site and port incisions.  There is no evidence of infection.  Chest x-ray has been fairly stable in appearance with some consolidation/atelectasis. He had a small, intermittent air leak with cough on 12/09. This did resolve. Chest was removed on 12/10. Same day chest x ray showed no pneumothorax. Follow up chest x ray showed  no pneumothorax and volume loss within left hemithorax. He has been ambulating on room air with good oxygenation. All wounds are clean, dry healing without signs of infection. Posterior wound dehisced as tape placed on wound which removed  the derma bond. There is a fair amount of ecchymosis left lateral chest and upper thigh. He has been tolerating a diet. He had a fib/flutter and HR in the 110's post op day 4/5. EKG was obtained, he was given IV Lopressor 2.5 mg once, Amiodarone 150 mg IV bolus, started on Toprol XL 25 mg daily and oral Amiodarone 400 mg bid. In addition, because SBP in the 130's+, low dose Valsartan was restarted. Repeat BMET done 12/11 showed potassium 5 (given Lasix) and creatinine 1.32. He is maintaining sinus rhythm. He is felt stable for discharge today.  Consults: None  Significant Diagnostic Studies:  Narrative & Impression  CLINICAL DATA:  Pneumothorax.   EXAM: PORTABLE CHEST 1 VIEW   COMPARISON:  Radiographs 05/14/2023 and 05/13/2023.  CT 03/27/2023.   FINDINGS: 1224 hours. Interval left chest tube removal. No pneumothorax is seen. There is stable volume loss in the left hemithorax from recent lobectomy. There is mild subsegmental atelectasis at both lung bases. No significant pleural effusion. The heart size and mediastinal contours are stable. No acute osseous findings are seen.   IMPRESSION: 1. Interval left chest tube removal. No pneumothorax identified. 2. Stable volume loss in the left hemithorax from recent lobectomy.     Electronically Signed   By: Carey Bullocks M.D.   On: 05/15/2023 16:46   CLINICAL DATA:  Postop lobectomy.   EXAM: CHEST  1 VIEW   COMPARISON:  Radiographs 05/12/2023 and 05/11/2023.  CT 03/27/2023.   FINDINGS: 0616 hours. Patient is mildly rotated to the right. Left chest tube remains in place. Similar mild linear atelectasis at both lung bases following recent partial left lung resection. No definite pneumothorax or significant pleural effusion. The heart size and mediastinal contours are stable. There is mild soft tissue emphysema laterally in the left chest wall.   IMPRESSION: Stable postoperative chest following partial left lung resection.  No definite pneumothorax. Left chest tube remains in place.     Electronically Signed   By: Carey Bullocks M.D.   On: 05/13/2023 09:56  Treatments: surgery:  Xi robotic assisted left upper lobectomy, lymph node dissection, and intercostal nerve blocks levels 3 through 10 by Dr. Dorris Fetch on 05/11/2023.  Pathology: Clinical History: left upper lobe nodules (cm)  FINAL MICROSCOPIC DIAGNOSIS:  A. LYMPH NODE, LEVEL 9, EXCISION:      One lymph node, negative for metastatic carcinoma (0/1).  B. LYMPH NODE, LEVEL 8, EXCISION:      One lymph node, negative for metastatic carcinoma (0/1).  C. LYMPH NODE, LEVEL 9 #2, EXCISION:      One lymph node, negative for metastatic carcinoma (0/1).  D. LYMPH NODE, LEVEL 9 #3, EXCISION:      One lymph node, negative for metastatic carcinoma (0/1).  E. LYMPH NODE, LEVEL 7, EXCISION:      One lymph node, negative for metastatic carcinoma (0/1).  F. LYMPH NODE, LEVEL 10, EXCISION:      One lymph node, negative for metastatic carcinoma (0/1).  G. LYMPH NODE,  LEVEL 5, EXCISION:      One lymph node, negative for metastatic carcinoma (0/1).  H. LYMPH NODE, LEVEL 11, EXCISION:      One lymph node, negative for metastatic carcinoma (0/1).  I. LYMPH NODE, LEVEL 10 #2, EXCISION:      One lymph node, negative for metastatic carcinoma (0/1).  J. LYMPH NODE, LEVEL 12, EXCISION:      One lymph node, negative for metastatic carcinoma (0/1).  K. LYMPH NODE, LEVEL 11 #2, EXCISION:      One lymph node, negative for metastatic carcinoma (0/1).  L. LUNG, LEFT UPPER LOBE, LOBECTOMY:      Metastatic prostatic adenocarcinoma.      Lymphovascular invasion identified.      Surgical margins of resection are negative for carcinoma.      Seven lymph nodes, negative for metastatic carcinoma (0/7).      See comment.   Discharge Exam: Blood pressure 123/78, pulse 75, temperature 98.8 F (37.1 C), temperature source Oral, resp. rate 17, height 5\' 9"  (1.753 m),  weight 77.6 kg, SpO2 94%. Cardiovascular: RRR Pulmonary: Clear to auscultation bilaterally. Ecchymosis left chest wall/upper thigh. Abdomen: Soft, non tender, bowel sounds present. Extremities: No lower extremity edema. Wounds: Clean and dry.  No erythema or signs of infection.   Back wound that is dehisced is mostly dry this am. No drainage on dressing from chest tube wound this am.   Discharge Medications:  Allergies as of 05/17/2023   No Known Allergies      Medication List     STOP taking these medications    hydrochlorothiazide 25 MG tablet Commonly known as: HYDRODIURIL       TAKE these medications    Accu-Chek Guide test strip Generic drug: glucose blood USE 1  ONCE DAILY   Accu-Chek Guide w/Device Kit Use to check BS daily   amiodarone 200 MG tablet Commonly known as: PACERONE Take 400 mg two times daily for 2 days;then take 200 mg two times daily for 7 days;then take Amiodarone 200 mg daily thereafter   aspirin EC 81 MG tablet Take 81 mg by mouth daily.   atorvastatin 40 MG tablet Commonly known as: LIPITOR Take 40 mg by mouth daily.   B-12 PO Take 1 capsule by mouth daily.   calcium citrate-vitamin D 315-200 MG-UNIT tablet Commonly known as: CITRACAL+D Take 1 tablet by mouth 2 (two) times daily.   Centrum Silver 50+Men Tabs Take 1 tablet by mouth daily.   cetirizine 10 MG tablet Commonly known as: ZYRTEC Take 10 mg by mouth daily.   DAYQUIL PO Take 2 capsules by mouth daily as needed (congestion).   gabapentin 300 MG capsule Commonly known as: NEURONTIN Take 1 capsule (300 mg total) by mouth at bedtime.   glipiZIDE 10 MG 24 hr tablet Commonly known as: GLUCOTROL XL Take 10 mg by mouth daily.   magnesium oxide 400 MG tablet Commonly known as: MAG-OX Take 800 mg by mouth daily.   metFORMIN 500 MG 24 hr tablet Commonly known as: GLUCOPHAGE-XR Take 1,000 mg by mouth 2 (two) times daily.   metoprolol succinate 25 MG 24 hr  tablet Commonly known as: TOPROL-XL Take 1 tablet (25 mg total) by mouth daily.   Microlet Lancets Misc USE 1  TO CHECK GLUCOSE ONCE DAILY TO  CHECK  GLUCOSE   montelukast 10 MG tablet Commonly known as: SINGULAIR Take 10 mg by mouth at bedtime.   Myrbetriq 50 MG Tb24 tablet Generic drug: mirabegron ER Take  50 mg by mouth daily.   omeprazole 20 MG capsule Commonly known as: PRILOSEC Take 20 mg by mouth daily as needed (indigestion).   oxyCODONE 5 MG immediate release tablet Commonly known as: Oxy IR/ROXICODONE Take 1 tablet (5 mg total) by mouth every 6 (six) hours as needed for severe pain (pain score 7-10).   Ozempic (0.25 or 0.5 MG/DOSE) 2 MG/3ML Sopn Generic drug: Semaglutide(0.25 or 0.5MG /DOS) Inject 0.5 mg as directed every Sunday.   SUMAtriptan 100 MG tablet Commonly known as: IMITREX Take 100 mg by mouth every 2 (two) hours as needed for migraine (migraine).   triamcinolone ointment 0.1 % Commonly known as: KENALOG APPLY  OINTMENT TOPICALLY TWICE DAILY   valsartan 40 MG tablet Commonly known as: DIOVAN Take 1 tablet (40 mg total) by mouth daily. What changed:  medication strength how much to take when to take this   VITAMIN C PO Take 1 tablet by mouth daily.   VITAMIN D PO Take 1 capsule by mouth daily.   ZINC PO Take 1 tablet by mouth daily.        Follow-up Information     Loreli Slot, MD. Go on 05/28/2023.   Specialty: Cardiothoracic Surgery Why: Appointment time is at 1:45 pm Contact information: [address] [employer] [address] [place] [address] [phone]         [facility]. Go on 05/28/2023.   Why: Please arrive by 12:45 pm in order to have a PA/LAT CXR taken PRIOR to office appointment with Dr. Joneen Roach information: [address] [place] [address]                Signed:  Ardelle Balls, Cordelia Poche 05/17/2023, 7:24 AM

## 2023-05-11 NOTE — Anesthesia Postprocedure Evaluation (Signed)
Anesthesia Post Note  Patient: Douglas Zhang  Procedure(s) Performed: XI ROBOTIC ASSISTED THORACOSCOPY-LEFT UPPER LOBECTOMY (Left: Chest) NODE DISSECTION (Left: Chest) INTERCOSTAL NERVE BLOCK (Left: Chest)     Patient location during evaluation: PACU Anesthesia Type: General Level of consciousness: awake Pain management: pain level controlled Vital Signs Assessment: post-procedure vital signs reviewed and stable Respiratory status: spontaneous breathing, nonlabored ventilation and respiratory function stable Cardiovascular status: blood pressure returned to baseline and stable Postop Assessment: no apparent nausea or vomiting Anesthetic complications: no   No notable events documented.  Last Vitals:  Vitals:   05/11/23 1245 05/11/23 1300  BP: 118/84 (!) 123/92  Pulse: 82 90  Resp: 11 12  Temp:    SpO2: 98% 97%    Last Pain:  Vitals:   05/11/23 1300  TempSrc:   PainSc: 5                  Casten Floren P Gaye Scorza

## 2023-05-11 NOTE — Interval H&P Note (Signed)
History and Physical Interval Note:   Consensus opinion of tumor board was to proceed with left upper lobectomy.  Likely XRT to RUL nodule  05/11/2023 7:10 AM  Douglas Zhang  has presented today for surgery, with the diagnosis of LUL NODULES.  The various methods of treatment have been discussed with the patient and family. After consideration of risks, benefits and other options for treatment, the patient has consented to  Procedure(s): XI ROBOTIC ASSISTED THORACOSCOPY-LEFT UPPER LOBECTOMY (Left) as a surgical intervention.  The patient's history has been reviewed, patient examined, no change in status, stable for surgery.  I have reviewed the patient's chart and labs.  Questions were answered to the patient's satisfaction.     Loreli Slot

## 2023-05-11 NOTE — Discharge Instructions (Signed)
Robot-Assisted Thoracic Surgery, Care After The following information offers guidance on how to care for yourself after your procedure. Your health care provider may also give you more specific instructions. If you have problems or questions, contact your health care provider. What can I expect after the procedure? After the procedure, it is common to have: Some pain and aches in the area of your surgical incisions. Pain when breathing in (inhaling) and coughing. Tiredness (fatigue). Trouble sleeping. Constipation. Follow these instructions at home: Medicines Take over-the-counter and prescription medicines only as told by your health care provider. If you were prescribed an antibiotic medicine, take it as told by your health care provider. Do not stop taking the antibiotic even if you start to feel better. Talk with your health care provider about safe and effective ways to manage pain after your procedure. Pain management should fit your specific health needs. Take pain medicine before pain becomes severe. Relieving and controlling your pain will make breathing easier for you. Ask your health care provider if the medicine prescribed to you requires you to avoid driving or using machinery. Eating and drinking Follow instructions from your health care provider about eating or drinking restrictions. These will vary depending on what procedure you had. Your health care provider may recommend: A liquid diet or soft diet for the first few days. Meals that are smaller and more frequent. A diet of fruits, vegetables, whole grains, and low-fat proteins. Limiting foods that are high in fat and processed sugar, including fried or sweet foods. Incision care Follow instructions from your health care provider about how to take care of your incisions. Make sure you: Wash your hands with soap and water for at least 20 seconds before and after you change your bandage (dressing). If soap and water are not  available, use hand sanitizer. Change your dressing as told by your health care provider. Leave stitches (sutures), skin glue, or adhesive strips in place. These skin closures may need to stay in place for 2 weeks or longer. If adhesive strip edges start to loosen and curl up, you may trim the loose edges. Do not remove adhesive strips completely unless your health care provider tells you to do that. Check your incision area every day for signs of infection. Check for: Redness, swelling, or more pain. Fluid or blood. Warmth. Pus or a bad smell. Activity Return to your normal activities as told by your health care provider. Ask your health care provider what activities are safe for you. Ask your health care provider when it is safe for you to drive. Do not lift anything that is heavier than 10 lb (4.5 kg), or the limit that you are told, until your health care provider says that it is safe. Rest as told by your health care provider. Avoid sitting for a long time without moving. Get up to take short walks every 1-2 hours. This is important to improve blood flow and breathing. Ask for help if you feel weak or unsteady. Do exercises as told by your health care provider. Pneumonia prevention  Do deep breathing exercises and cough regularly as directed. This helps clear mucus and opens your lungs. Doing this helps prevent lung infection (pneumonia). If you were given an incentive spirometer, use it as told. An incentive spirometer is a tool that measures how well you are filling your lungs with each breath. Coughing may hurt less if you try to support your chest. This is called splinting. Try one of these when you  cough: Hold a pillow against your chest. Place the palms of both hands on top of your incision area. Do not use any products that contain nicotine or tobacco. These products include cigarettes, chewing tobacco, and vaping devices, such as e-cigarettes. If you need help quitting, ask your  health care provider. Avoid secondhand smoke. General instructions If you have a drainage tube: Follow instructions from your health care provider about how to take care of it. Do not travel by airplane after your tube is removed until your health care provider tells you it is safe. You may need to take these actions to prevent or treat constipation: Drink enough fluid to keep your urine pale yellow. Take over-the-counter or prescription medicines. Eat foods that are high in fiber, such as beans, whole grains, and fresh fruits and vegetables. Limit foods that are high in fat and processed sugars, such as fried or sweet foods. Keep all follow-up visits. This is important. Contact a health care provider if: You have redness, swelling, or more pain around an incision. You have fluid or blood coming from an incision. An incision feels warm to the touch. You have pus or a bad smell coming from an incision. You have a fever. You cannot eat or drink without vomiting. Your pain medicine is not controlling your pain. Get help right away if: You have chest pain. Your heart is beating quickly. You have trouble breathing. You have trouble speaking. You are confused. You feel weak or dizzy, or you faint. These symptoms may represent a serious problem that is an emergency. Do not wait to see if the symptoms will go away. Get medical help right away. Call your local emergency services (911 in the [place] Do not drive yourself to the hospital. Summary Talk with your health care provider about safe and effective ways to manage pain after your procedure. Pain management should fit your specific health needs. Return to your normal activities as told by your health care provider. Ask your health care provider what activities are safe for you. Do deep breathing exercises and cough regularly as directed. This helps to clear mucus and prevent pneumonia. If it hurts to cough, ease pain by holding a pillow  against your chest or by placing the palms of both hands over your incisions. This information is not intended to replace advice given to you by your health care provider. Make sure you discuss any questions you have with your health care provider. Document Revised: 02/12/2020 Document Reviewed: 02/13/2020 Elsevier Patient Education  2024 [employer].

## 2023-05-11 NOTE — Op Note (Unsigned)
Douglas Zhang, Douglas Zhang MEDICAL RECORD NO: [id number] ACCOUNT NO: [id number] DATE OF BIRTH: Apr 09, 1956 FACILITY: [facility] LOCATION: [facility] PHYSICIAN: Salvatore Decent. Dorris Fetch, MD  Operative Report   DATE OF PROCEDURE: 05/11/2023   PREOPERATIVE DIAGNOSIS:  Left upper lobe lung nodules/adenocarcinoma.  POSTOPERATIVE DIAGNOSIS:  Left upper lobe lung nodules/adenocarcinoma.  PROCEDURE:  Xi robotic assisted left upper lobectomy, lymph node dissection, and intercostal nerve blocks levels 3 through 10.  SURGEON:  Salvatore Decent.  Dorris Fetch, MD  ASSISTANT:  Doree Fudge, PA.   Experienced assistance necessary for this case due to surgical complexity.  Doree Fudge assisted with port placement, camera management, robot docking and undocking, instrument exchange, suctioning, specimen retrieval and wound closure.    ANESTHESIA:  General.  FINDINGS:  Benign-appearing lymph nodes.  Normal anatomy.  CLINICAL NOTE:  The patient is a 67 year old gentleman with a history of prostate cancer.  He was recently found to have 3 lung nodules, a small nodule in the right upper lobe and two larger nodules in the left upper lobe.  Navigational bronchoscopy and  biopsy of the left upper lobe nodule showed adenocarcinoma, but it was unclear if this was metastatic prostate cancer or primary lung cancer.  After discussion with tumor board and Dr. Laverle Patter from Urology, the consensus of the entire group was to do a  left upper lobectomy for resection of the two left upper lobe nodules and then we will reassess whether to do wedge resection or stereotactic radiation to the right upper lobe nodule.  The indications, risks, benefits, and alternatives were discussed in  detail with the patient.  He understood and accepted the risks and agreed to proceed.  OPERATIVE NOTE:  The patient was brought to the operating room on 05/11/2023.  He had induction of general anesthesia and was intubated with a double-lumen endotracheal tube.   Intravenous antibiotics were administered.  A Foley catheter was placed.   Sequential compression devices were placed on the calves for DVT prophylaxis.  He was placed in a right lateral decubitus position.  Bair Hugger was placed for active warming.  The left chest was prepped and draped in the usual sterile fashion.    A time-out was performed.  A solution containing 20 mL of liposomal bupivacaine, 30 mL of 0.5% bupivacaine, and 50 mL of saline was prepared.  The solution was used for local at the incision sites as well as for the intercostal nerve blocks.  An incision  was made in the eighth interspace and approximately the midaxillary line.  An 8-mm robotic port was inserted.  The thoracoscope was advanced into the chest.  After confirming intrapleural placement, carbon dioxide was insufflated per protocol.  A 12 mm  robotic port was placed in the eighth interspace anterior to the camera port.  Intercostal nerve blocks then were performed from the third to the tenth interspace by injecting 10 mL of bupivacaine solution into a subpleural plane at each level.  A 12 mm  aerosol port was placed in the 10th interspace posterolaterally and then two additional robotic ports were placed in the 8th interspace.  The robot was deployed.  The camera arm was docked.  Targeting was performed.  The remaining arms were docked.  The  robotic instruments were inserted with thoracoscopic visualization.    There was good isolation of the left lung.  The lung was retracted superiorly.  The inferior pulmonary ligament was divided.  There were multiple nodes which were taken separately.  All lymph nodes during the procedure appeared  to be grossly normal.  The  pleural reflection was divided to the hilum posteriorly and level 8 and then level 7 nodes were removed.  The pleura was dissected off the pulmonary artery extending into the fissure.  Initial dissection was begun on the pulmonary artery posterior  branch.  The  pleural reflection then was divided at the hilum superiorly and level 10 and level 5 nodes were removed.  The lung was difficult to retract posteriorly, so attention was turned to the fissure.  Initial dissection in the anterior portion of  the fissure revealed that it was incomplete.  In the mid to posterior portion, the pulmonary artery was visible and the pleura was divided over the pulmonary artery and the plane was initiated.  Two firings of the stapler were performed from an anterior  to a posterior approach working on the anterior portion of the fissure.  This then allowed the lung to be more easily retracted exposing level 11 nodes, which were dissected out.  The superior vein branches were dissected out.  Another level 10 node was  removed.  These branches were encircled and divided with a robotic stapler.  The remainder of the fissure anteriorly and posteriorly then was completed using the robotic stapler.  Additional level 12 nodes were dissected out.  The lingular artery branch  was relatively small.  It was divided with a robotic stapler separately and then working posteriorly, there were two posterior branches in close proximity, which were divided.  The lung then was retracted inferiorly and the anterior and apical branches  were identified.  The anterior branch was divided first followed by the apical branch.  The stapler then was placed across the left upper lobe bronchus at its origin.  It was closed.  The test inflation showed good irrigation of the lower lobe.  The  staple was fired transecting the bronchus.  The vessel loop and sponges used during the dissection were removed.  The chest was copiously irrigated with saline.  The test inflation to 30 cm of water pressure revealed no air leakage.  The robotic  instruments were removed and the robot was undocked.  The anterior eighth interspace incision was lengthened to approximately 3 cm.  A 15 mm endoscopic retrieval bag was placed through  the incision and the upper lobe was manipulated into the bag, which  was then removed.  This was sent for permanent pathology.  Both lesions were at a significant distance from the bronchus and the bronchial margin did not require frozen section.  An inspection was made of all the staple lines and port sites for  hemostasis.  A 28-French chest tube was placed through the original port incision and directed to the apex.  It was secured with #1 silk suture.  Dual lung ventilation was resumed.  The incisions were closed in standard fashion.  The chest tube was  placed to a Pleur evac on water seal.  The patient was placed back in the supine position and then extubated in the operating room and taken to the post-anesthetic care unit in good condition.  All sponge, needle, and instrument counts were correct at  the end of the procedure.  There were no intraoperative complications.      SUJ D: 05/11/2023 4:46:16 pm T: 05/11/2023 10:36:00 pm  JOB: [id number]/ [id number]

## 2023-05-12 ENCOUNTER — Inpatient Hospital Stay (HOSPITAL_COMMUNITY): Payer: Medicare HMO

## 2023-05-12 ENCOUNTER — Encounter (HOSPITAL_COMMUNITY): Payer: Self-pay | Admitting: Thoracic Surgery (Cardiothoracic Vascular Surgery)

## 2023-05-12 LAB — CBC
HCT: 32.2 % — ABNORMAL LOW (ref 39.0–52.0)
Hemoglobin: 11 g/dL — ABNORMAL LOW (ref 13.0–17.0)
MCH: 29.2 pg (ref 26.0–34.0)
MCHC: 34.2 g/dL (ref 30.0–36.0)
MCV: 85.4 fL (ref 80.0–100.0)
Platelets: 168 10*3/uL (ref 150–400)
RBC: 3.77 MIL/uL — ABNORMAL LOW (ref 4.22–5.81)
RDW: 13.2 % (ref 11.5–15.5)
WBC: 10.7 10*3/uL — ABNORMAL HIGH (ref 4.0–10.5)
nRBC: 0 % (ref 0.0–0.2)

## 2023-05-12 LAB — BASIC METABOLIC PANEL
Anion gap: 10 (ref 5–15)
BUN: 39 mg/dL — ABNORMAL HIGH (ref 8–23)
CO2: 22 mmol/L (ref 22–32)
Calcium: 8.6 mg/dL — ABNORMAL LOW (ref 8.9–10.3)
Chloride: 103 mmol/L (ref 98–111)
Creatinine, Ser: 1.57 mg/dL — ABNORMAL HIGH (ref 0.61–1.24)
GFR, Estimated: 48 mL/min — ABNORMAL LOW (ref >60)
Glucose, Bld: 181 mg/dL — ABNORMAL HIGH (ref 70–99)
Potassium: 4.4 mmol/L (ref 3.5–5.1)
Sodium: 135 mmol/L (ref 135–145)

## 2023-05-12 LAB — GLUCOSE, CAPILLARY
Glucose-Capillary: 181 mg/dL — ABNORMAL HIGH (ref 70–99)
Glucose-Capillary: 196 mg/dL — ABNORMAL HIGH (ref 70–99)
Glucose-Capillary: 148 mg/dL — ABNORMAL HIGH (ref 70–99)
Glucose-Capillary: 157 mg/dL — ABNORMAL HIGH (ref 70–99)

## 2023-05-12 NOTE — Plan of Care (Signed)

## 2023-05-12 NOTE — Progress Notes (Signed)
Mobility Specialist Progress Note    05/12/23 1207  Mobility  Activity Ambulated with assistance in hallway  Level of Assistance Contact guard assist, steadying assist  Assistive Device Front wheel walker  Distance Ambulated (ft) 280 ft  Activity Response Tolerated well  Mobility Referral Yes  Mobility visit 1 Mobility  Mobility Specialist Start Time (ACUTE ONLY) 1150  Mobility Specialist Stop Time (ACUTE ONLY) 1202  Mobility Specialist Time Calculation (min) (ACUTE ONLY) 12 min   Pre-Mobility: 78 HR, 96% SpO2 Post-Mobility: 84 HR, 96% SpO2  Pt received in chair and agreeable. No complaints. Upon return to room, pt noticed gown saturated with blood. RN notified. Returned to supine in bed with RN present.   [name] Mobility Specialist  Please Neurosurgeon or Rehab Office at [phone]

## 2023-05-12 NOTE — Progress Notes (Signed)
1 Day Post-Op Procedure(s) (LRB): XI ROBOTIC ASSISTED THORACOSCOPY-LEFT UPPER LOBECTOMY (Left) NODE DISSECTION (Left) INTERCOSTAL NERVE BLOCK (Left) Subjective: Feels well  Objective: Vital signs in last 24 hours: Temp:  [97 F (36.1 C)-97.9 F (36.6 C)] 97.9 F (36.6 C) (12/07 0339) Pulse Rate:  [71-103] 75 (12/07 0508) Cardiac Rhythm: Normal sinus rhythm (12/07 0339) Resp:  [7-17] 12 (12/07 0508) BP: (104-150)/(68-98) 113/71 (12/07 0339) SpO2:  [90 %-99 %] 94 % (12/07 0508) Arterial Line BP: (134-161)/(59-74) 135/59 (12/06 1245) Weight:  [77.6 kg] 77.6 kg (12/06 1838)  Hemodynamic parameters for last 24 hours:    Intake/Output from previous day: 12/06 0701 - 12/07 0700 In: 1800 [P.O.:300; I.V.:1300; IV Piggyback:200] Out: 1265 [Urine:1025; Blood:20; Chest Tube:220] Intake/Output this shift: No intake/output data recorded.  General appearance: alert, cooperative, and no distress Heart: regular rate and rhythm Lungs: clear to auscultation bilaterally Abdomen: benign Extremities: no edema or calf tenderness Wound: incis healing well  Lab Results: Recent Labs    05/09/23 1440 05/12/23 0158  WBC 6.2 10.7*  HGB 13.7 11.0*  HCT 41.4 32.2*  PLT 173 168   BMET:  Recent Labs    05/09/23 1440 05/12/23 0158  NA 138 135  K 4.5 4.4  CL 104 103  CO2 26 22  GLUCOSE 111* 181*  BUN 29* 39*  CREATININE 1.30* 1.57*  CALCIUM 10.0 8.6*    PT/INR:  Recent Labs    05/09/23 1440  LABPROT 15.0  INR 1.2   ABG No results found for: "PHART", "HCO3", "TCO2", "ACIDBASEDEF", "O2SAT" CBG (last 3)  Recent Labs    05/11/23 1631 05/11/23 2128 05/12/23 0623  GLUCAP 169* 201* 181*    Meds Scheduled Meds:  acetaminophen  1,000 mg Oral Q6H   Or   acetaminophen (TYLENOL) oral liquid 160 mg/5 mL  1,000 mg Oral Q6H   aspirin EC  81 mg Oral Daily   atorvastatin  40 mg Oral Daily   bisacodyl  10 mg Oral QHS   enoxaparin (LOVENOX) injection  40 mg Subcutaneous Q24H    gabapentin  300 mg Oral QHS   insulin aspart  0-15 Units Subcutaneous TID WC   ketorolac  15 mg Intravenous Q6H   loratadine  10 mg Oral Daily   magnesium oxide  800 mg Oral Daily   methocarbamol  500 mg Oral BID   mirabegron ER  50 mg Oral Daily   montelukast  10 mg Oral QHS   pantoprazole  40 mg Oral Daily   senna-docusate  1 tablet Oral QHS   triamcinolone ointment   Topical BID   Continuous Infusions:  sodium chloride 100 mL/hr at 05/12/23 0332   PRN Meds:.fentaNYL (SUBLIMAZE) injection, ondansetron (ZOFRAN) IV, oxyCODONE  Xrays DG Chest Port 1 View  Result Date: 05/11/2023 CLINICAL DATA:  67 year old male status post left upper lobectomy postoperative day zero. EXAM: PORTABLE CHEST 1 VIEW COMPARISON:  Chest radiographs 05/09/2023 and earlier. FINDINGS: Portable AP semi upright view at 1031 hours. Left chest tube in place. Lower lung volumes. Left upper lung postoperative pneumothorax, no mediastinal mass effect. Platelike right lung base atelectasis. No pulmonary edema or pleural effusion. Stable cardiac size and mediastinal contours. Visualized tracheal air column is within normal limits. Negative visible bowel gas. IMPRESSION: 1. New left upper lobectomy with left chest tube in place and no adverse features. 2. Right lung atelectasis. Electronically Signed   By: Odessa Fleming M.D.   On: 05/11/2023 12:10    Assessment/Plan: S/P Procedure(s) (LRB): XI ROBOTIC ASSISTED  THORACOSCOPY-LEFT UPPER LOBECTOMY (Left) NODE DISSECTION (Left) INTERCOSTAL NERVE BLOCK (Left) POD#1  1 afeb, VSS, sinus rhythm 2 O2 sats good on RA 3 good UOP 3 CT 220 ml, no air leak,leave for now 4 BS fair control, some readings in low 100's, on SSI- resume home meds at d/c  5 creat 1.57- in range c/w most recent values over past couple months 6 expected ABLA- monitor clinically, not at transfusion threshold 7 CXR - clear lung fields, no pntx 8 routine pulm hygiene and rehab, poss home in am if no new  issues      LOS: 1 day    Rowe Clack PA-C Pager [phone] 05/12/2023

## 2023-05-13 ENCOUNTER — Inpatient Hospital Stay (HOSPITAL_COMMUNITY): Payer: Medicare HMO

## 2023-05-13 LAB — COMPREHENSIVE METABOLIC PANEL
ALT: 26 U/L (ref 0–44)
AST: 28 U/L (ref 15–41)
Albumin: 2.9 g/dL — ABNORMAL LOW (ref 3.5–5.0)
Alkaline Phosphatase: 43 U/L (ref 38–126)
Anion gap: 7 (ref 5–15)
BUN: 41 mg/dL — ABNORMAL HIGH (ref 8–23)
CO2: 23 mmol/L (ref 22–32)
Calcium: 8.3 mg/dL — ABNORMAL LOW (ref 8.9–10.3)
Chloride: 104 mmol/L (ref 98–111)
Creatinine, Ser: 1.59 mg/dL — ABNORMAL HIGH (ref 0.61–1.24)
GFR, Estimated: 47 mL/min — ABNORMAL LOW (ref >60)
Glucose, Bld: 150 mg/dL — ABNORMAL HIGH (ref 70–99)
Potassium: 4.2 mmol/L (ref 3.5–5.1)
Sodium: 134 mmol/L — ABNORMAL LOW (ref 135–145)
Total Bilirubin: 0.8 mg/dL (ref <1.2)
Total Protein: 5.2 g/dL — ABNORMAL LOW (ref 6.5–8.1)

## 2023-05-13 LAB — CBC
HCT: 28.7 % — ABNORMAL LOW (ref 39.0–52.0)
Hemoglobin: 9.6 g/dL — ABNORMAL LOW (ref 13.0–17.0)
MCH: 29.2 pg (ref 26.0–34.0)
MCHC: 33.4 g/dL (ref 30.0–36.0)
MCV: 87.2 fL (ref 80.0–100.0)
Platelets: 122 10*3/uL — ABNORMAL LOW (ref 150–400)
RBC: 3.29 MIL/uL — ABNORMAL LOW (ref 4.22–5.81)
RDW: 13.2 % (ref 11.5–15.5)
WBC: 7.1 10*3/uL (ref 4.0–10.5)
nRBC: 0 % (ref 0.0–0.2)

## 2023-05-13 LAB — GLUCOSE, CAPILLARY
Glucose-Capillary: 143 mg/dL — ABNORMAL HIGH (ref 70–99)
Glucose-Capillary: 109 mg/dL — ABNORMAL HIGH (ref 70–99)
Glucose-Capillary: 167 mg/dL — ABNORMAL HIGH (ref 70–99)
Glucose-Capillary: 157 mg/dL — ABNORMAL HIGH (ref 70–99)

## 2023-05-13 NOTE — Progress Notes (Addendum)
2 Days Post-Op Procedure(s) (LRB): XI ROBOTIC ASSISTED THORACOSCOPY-LEFT UPPER LOBECTOMY (Left) NODE DISSECTION (Left) INTERCOSTAL NERVE BLOCK (Left) Subjective: Feels some soreness from tube  Objective: Vital signs in last 24 hours: Temp:  [97.9 F (36.6 C)-98.2 F (36.8 C)] 98 F (36.7 C) (12/08 0757) Pulse Rate:  [64-78] 71 (12/08 0757) Cardiac Rhythm: Normal sinus rhythm (12/08 0436) Resp:  [11-18] 13 (12/08 0757) BP: (107-120)/(68-78) 114/76 (12/08 0757) SpO2:  [95 %-99 %] 95 % (12/08 0757)  Hemodynamic parameters for last 24 hours:    Intake/Output from previous day: 12/07 0701 - 12/08 0700 In: 3176.6 [P.O.:1140; I.V.:2036.6] Out: 1495 [Urine:1150; Chest Tube:345] Intake/Output this shift: No intake/output data recorded.  General appearance: alert, cooperative, and no distress Heart: regular rate and rhythm Lungs: clear to auscultation bilaterally Abdomen: benign Extremities: no edema or calf tenderness Wound: increased flank echymosis and mod bloody drainage on chest tube dressing  Lab Results: Recent Labs    05/12/23 0158 05/13/23 0202  WBC 10.7* 7.1  HGB 11.0* 9.6*  HCT 32.2* 28.7*  PLT 168 122*   BMET:  Recent Labs    05/12/23 0158 05/13/23 0202  NA 135 134*  K 4.4 4.2  CL 103 104  CO2 22 23  GLUCOSE 181* 150*  BUN 39* 41*  CREATININE 1.57* 1.59*  CALCIUM 8.6* 8.3*    PT/INR: No results for input(s): "LABPROT", "INR" in the last 72 hours. ABG No results found for: "PHART", "HCO3", "TCO2", "ACIDBASEDEF", "O2SAT" CBG (last 3)  Recent Labs    05/12/23 1651 05/12/23 2110 05/13/23 0605  GLUCAP 148* 157* 143*    Meds Scheduled Meds:  acetaminophen  1,000 mg Oral Q6H   Or   acetaminophen (TYLENOL) oral liquid 160 mg/5 mL  1,000 mg Oral Q6H   aspirin EC  81 mg Oral Daily   atorvastatin  40 mg Oral Daily   bisacodyl  10 mg Oral QHS   enoxaparin (LOVENOX) injection  40 mg Subcutaneous Q24H   gabapentin  300 mg Oral QHS   insulin aspart   0-15 Units Subcutaneous TID WC   ketorolac  15 mg Intravenous Q6H   loratadine  10 mg Oral Daily   magnesium oxide  800 mg Oral Daily   methocarbamol  500 mg Oral BID   mirabegron ER  50 mg Oral Daily   montelukast  10 mg Oral QHS   pantoprazole  40 mg Oral Daily   senna-docusate  1 tablet Oral QHS   triamcinolone ointment   Topical BID   Continuous Infusions: PRN Meds:.fentaNYL (SUBLIMAZE) injection, ondansetron (ZOFRAN) IV, oxyCODONE  Xrays DG Chest Port 1 View  Result Date: 05/12/2023 CLINICAL DATA:  Pneumothorax. EXAM: PORTABLE CHEST 1 VIEW COMPARISON:  05/11/2023. FINDINGS: Unremarkable cardiac silhouette. Normal pulmonary vasculature. Postop changes left hemithorax. Left-sided chest tube in place. No pneumothorax or pleural effusion. IMPRESSION: Postop changes. No acute cardiopulmonary process. Electronically Signed   By: Layla Maw M.D.   On: 05/12/2023 10:18   DG Chest Port 1 View  Result Date: 05/11/2023 CLINICAL DATA:  67 year old male status post left upper lobectomy postoperative day zero. EXAM: PORTABLE CHEST 1 VIEW COMPARISON:  Chest radiographs 05/09/2023 and earlier. FINDINGS: Portable AP semi upright view at 1031 hours. Left chest tube in place. Lower lung volumes. Left upper lung postoperative pneumothorax, no mediastinal mass effect. Platelike right lung base atelectasis. No pulmonary edema or pleural effusion. Stable cardiac size and mediastinal contours. Visualized tracheal air column is within normal limits. Negative visible bowel gas. IMPRESSION:  1. New left upper lobectomy with left chest tube in place and no adverse features. 2. Right lung atelectasis. Electronically Signed   By: Odessa Fleming M.D.   On: 05/11/2023 12:10    Assessment/Plan: S/P Procedure(s) (LRB): XI ROBOTIC ASSISTED THORACOSCOPY-LEFT UPPER LOBECTOMY (Left) NODE DISSECTION (Left) INTERCOSTAL NERVE BLOCK (Left) POD#2  1 afeb, VSS, sinus rhythm, short episode of afib- cont to monitor  2 sats  good on RA 3 CT - 345 ml/24h, + air leak- will leave tube in place 4 good UOP, not all measured 5 creat remains around baseline with CKD, 1.59, will stop toradol 6 H/H trending lower, ABLA, monitor clinically, not at transfusion threshold 7 thrombocytopenia, on lovenox and ASA 8 CXR some increase in atx right base 9 cont pulm hygiene and rehab    LOS: 2 days    Rowe Clack PA-C Pager [phone] 05/13/2023

## 2023-05-13 NOTE — Plan of Care (Signed)

## 2023-05-13 NOTE — Progress Notes (Signed)
Mobility Specialist Progress Note    05/13/23 1356  Mobility  Activity Ambulated with assistance in hallway  Level of Assistance Contact guard assist, steadying assist  Assistive Device Front wheel walker  Distance Ambulated (ft) 330 ft  Activity Response Tolerated well  Mobility Referral Yes  Mobility visit 1 Mobility  Mobility Specialist Start Time (ACUTE ONLY) 1345  Mobility Specialist Stop Time (ACUTE ONLY) 1355  Mobility Specialist Time Calculation (min) (ACUTE ONLY) 10 min   Pre-Mobility: 84 HR, 93% SpO2 During Mobility: 94 HR Post-Mobility: 86 HR  Pt received in bed and agreeable. No complaints on walk. Returned to bed with call bell in reach.   [place] Mobility Specialist  Please Neurosurgeon or Rehab Office at [phone]

## 2023-05-14 ENCOUNTER — Encounter (HOSPITAL_COMMUNITY): Payer: Self-pay | Admitting: Thoracic Surgery (Cardiothoracic Vascular Surgery)

## 2023-05-14 ENCOUNTER — Inpatient Hospital Stay (HOSPITAL_COMMUNITY): Payer: Medicare HMO

## 2023-05-14 LAB — CBC
HCT: 30.5 % — ABNORMAL LOW (ref 39.0–52.0)
Hemoglobin: 10.2 g/dL — ABNORMAL LOW (ref 13.0–17.0)
MCH: 29.2 pg (ref 26.0–34.0)
MCHC: 33.4 g/dL (ref 30.0–36.0)
MCV: 87.4 fL (ref 80.0–100.0)
Platelets: 152 10*3/uL (ref 150–400)
RBC: 3.49 MIL/uL — ABNORMAL LOW (ref 4.22–5.81)
RDW: 13.3 % (ref 11.5–15.5)
WBC: 6 10*3/uL (ref 4.0–10.5)
nRBC: 0 % (ref 0.0–0.2)

## 2023-05-14 LAB — GLUCOSE, CAPILLARY
Glucose-Capillary: 152 mg/dL — ABNORMAL HIGH (ref 70–99)
Glucose-Capillary: 173 mg/dL — ABNORMAL HIGH (ref 70–99)
Glucose-Capillary: 134 mg/dL — ABNORMAL HIGH (ref 70–99)
Glucose-Capillary: 145 mg/dL — ABNORMAL HIGH (ref 70–99)

## 2023-05-14 LAB — BASIC METABOLIC PANEL
Anion gap: 6 (ref 5–15)
BUN: 32 mg/dL — ABNORMAL HIGH (ref 8–23)
CO2: 25 mmol/L (ref 22–32)
Calcium: 8.6 mg/dL — ABNORMAL LOW (ref 8.9–10.3)
Chloride: 104 mmol/L (ref 98–111)
Creatinine, Ser: 1.36 mg/dL — ABNORMAL HIGH (ref 0.61–1.24)
GFR, Estimated: 57 mL/min — ABNORMAL LOW (ref >60)
Glucose, Bld: 153 mg/dL — ABNORMAL HIGH (ref 70–99)
Potassium: 4.9 mmol/L (ref 3.5–5.1)
Sodium: 135 mmol/L (ref 135–145)

## 2023-05-14 NOTE — Progress Notes (Addendum)
      [address]       [place] [address]             [phone]       3 Days Post-Op Procedure(s) (LRB): XI ROBOTIC ASSISTED THORACOSCOPY-LEFT UPPER LOBECTOMY (Left) NODE DISSECTION (Left) INTERCOSTAL NERVE BLOCK (Left)  Subjective: Patient already has eaten breakfast. He has some pain on left side/chest tube, but "not too bad".  Objective: Vital signs in last 24 hours: Temp:  [97.9 F (36.6 C)-98.9 F (37.2 C)] 98.9 F (37.2 C) (12/09 0520) Pulse Rate:  [71-83] 78 (12/09 0520) Cardiac Rhythm: Normal sinus rhythm;Bundle branch block (12/08 1858) Resp:  [13-18] 14 (12/09 0520) BP: (114-139)/(67-86) 134/74 (12/09 0520) SpO2:  [94 %-97 %] 94 % (12/09 0520)     Intake/Output from previous day: 12/08 0701 - 12/09 0700 In: 360 [P.O.:360] Out: 4156 [Urine:4000; Chest Tube:156]   Physical Exam:  Cardiovascular: RRR Pulmonary: Clear to auscultation bilaterally. Ecchymosis left chest wall/upper thigh. Abdomen: Soft, non tender, bowel sounds present. Extremities: Mild bilateral lower extremity edema. Wounds: Clean and dry.  No erythema or signs of infection. Back wound had dermabond removed when chest tube re taped so wound is draining light blood as is superficially dehisced Chest Tube: to water seal, small/intermittent air leak with cough  Lab Results: CBC: Recent Labs    05/13/23 0202 05/14/23 0157  WBC 7.1 6.0  HGB 9.6* 10.2*  HCT 28.7* 30.5*  PLT 122* 152   BMET:  Recent Labs    05/13/23 0202 05/14/23 0157  NA 134* 135  K 4.2 4.9  CL 104 104  CO2 23 25  GLUCOSE 150* 153*  BUN 41* 32*  CREATININE 1.59* 1.36*  CALCIUM 8.3* 8.6*    PT/INR: No results for input(s): "LABPROT", "INR" in the last 72 hours. ABG:  INR: Will add last result for INR, ABG once components are confirmed Will add last 4 CBG results once components are confirmed  Assessment/Plan:  1. CV - SR, first degree heart block.  2.  Pulmonary - On room air. Chest  tube with 156 cc last 24 hours. Chest tube to water seal, small, intermittent air leak with cough. CXR this am appears to show small left apical pneumothorax. ? Try clamping trial. Encourage incentive spirometer. 3. Creatinine this am decreased from 1.59 to 1.36. Creatinine prior to surgery was 1.3. Toradol stopped yesterday. 4. DM-CBGs 167/157/152. On Insulin. Will restart Glipizide XL and Metformin at discharge 5. On Lovenox for DVT prophylaxis  Donielle M ZimmermanPA-C 05/14/2023,7:18 AM  Patient seen and examined, agree with above He does have a small intermittent air leak, cough is poor, encouraged him to work on that Will leave tube on water seal today  Viviann Spare C. Dorris Fetch, MD [facility] [phone]

## 2023-05-14 NOTE — Plan of Care (Signed)
  Problem: Education: Goal: Knowledge of General Education information will improve Description: Including pain rating scale, medication(s)/side effects and non-pharmacologic comfort measures Outcome: Progressing   Problem: Health Behavior/Discharge Planning: Goal: Ability to manage health-related needs will improve Outcome: Progressing   Problem: Clinical Measurements: Goal: Ability to maintain clinical measurements within normal limits will improve Outcome: Progressing Goal: Will remain free from infection Outcome: Progressing Goal: Diagnostic test results will improve Outcome: Progressing Goal: Respiratory complications will improve Outcome: Progressing Goal: Cardiovascular complication will be avoided Outcome: Progressing   Problem: Activity: Goal: Risk for activity intolerance will decrease Outcome: Progressing   Problem: Nutrition: Goal: Adequate nutrition will be maintained Outcome: Progressing   Problem: Coping: Goal: Level of anxiety will decrease Outcome: Progressing   Problem: Elimination: Goal: Will not experience complications related to bowel motility Outcome: Progressing Goal: Will not experience complications related to urinary retention Outcome: Progressing   Problem: Pain Management: Goal: General experience of comfort will improve Outcome: Progressing   Problem: Safety: Goal: Ability to remain free from injury will improve Outcome: Progressing   Problem: Skin Integrity: Goal: Risk for impaired skin integrity will decrease Outcome: Progressing   Problem: Education: Goal: Knowledge of disease or condition will improve Outcome: Progressing Goal: Knowledge of the prescribed therapeutic regimen will improve Outcome: Progressing   Problem: Activity: Goal: Risk for activity intolerance will decrease Outcome: Progressing   Problem: Cardiac: Goal: Will achieve and/or maintain hemodynamic stability Outcome: Progressing   Problem: Clinical  Measurements: Goal: Postoperative complications will be avoided or minimized Outcome: Progressing   Problem: Respiratory: Goal: Respiratory status will improve Outcome: Progressing   Problem: Pain Management: Goal: Pain level will decrease Outcome: Progressing   Problem: Skin Integrity: Goal: Wound healing without signs and symptoms infection will improve Outcome: Progressing   Problem: Education: Goal: Ability to describe self-care measures that may prevent or decrease complications (Diabetes Survival Skills Education) will improve Outcome: Progressing Goal: Individualized Educational Video(s) Outcome: Progressing   Problem: Coping: Goal: Ability to adjust to condition or change in health will improve Outcome: Progressing   Problem: Fluid Volume: Goal: Ability to maintain a balanced intake and output will improve Outcome: Progressing   Problem: Health Behavior/Discharge Planning: Goal: Ability to identify and utilize available resources and services will improve Outcome: Progressing Goal: Ability to manage health-related needs will improve Outcome: Progressing   Problem: Metabolic: Goal: Ability to maintain appropriate glucose levels will improve Outcome: Progressing   Problem: Nutritional: Goal: Maintenance of adequate nutrition will improve Outcome: Progressing Goal: Progress toward achieving an optimal weight will improve Outcome: Progressing   Problem: Skin Integrity: Goal: Risk for impaired skin integrity will decrease Outcome: Progressing   Problem: Tissue Perfusion: Goal: Adequacy of tissue perfusion will improve Outcome: Progressing

## 2023-05-14 NOTE — Care Management Important Message (Signed)
Important Message  Patient Details  Name: Douglas Zhang MRN: [id number] Date of Birth: 04/12/1956   Important Message Given:  Yes - Medicare IM     Dorena Bodo 05/14/2023, 3:28 PM

## 2023-05-14 NOTE — TOC CM/SW Note (Signed)
Transition of Care [facility]) - Inpatient Brief Assessment   Patient Details  Name: Douglas Zhang MRN: [id number] Date of Birth: 05-31-1956  Transition of Care [facility]) CM/SW Contact:    Harriet Masson, RN Phone Number: 05/14/2023, 12:17 PM   Clinical Narrative: 3 Days Post-Op Procedure(s) (LRB): XI ROBOTIC ASSISTED THORACOSCOPY-LEFT UPPER LOBECTOMY. No TOC needs at this time.   Transition of Care Asessment: Insurance and Status: Insurance coverage has been reviewed Patient has primary care physician: Yes Home environment has been reviewed: safe to discharge home Prior level of function:: independent Prior/Current Home Services: No current home services Social Determinants of Health Reivew: SDOH reviewed no interventions necessary Readmission risk has been reviewed: Yes Transition of care needs: no transition of care needs at this time

## 2023-05-14 NOTE — Progress Notes (Signed)
Mobility Specialist Progress Note:    05/14/23 1000  Mobility  Activity Ambulated with assistance in hallway  Level of Assistance Contact guard assist, steadying assist  Assistive Device Front wheel walker  Distance Ambulated (ft) 340 ft  Activity Response Tolerated well  Mobility Referral Yes  Mobility visit 1 Mobility  Mobility Specialist Start Time (ACUTE ONLY) 1007  Mobility Specialist Stop Time (ACUTE ONLY) 1020  Mobility Specialist Time Calculation (min) (ACUTE ONLY) 13 min   Pt received in bed, agreeable to mobility. Asymptomatic w/ no complaints throughout ambulation. Pt left in bed with call bell and all needs met.  D'Vante Earlene Plater Mobility Specialist Please contact via Special educational needs teacher or Rehab office at [phone]

## 2023-05-15 ENCOUNTER — Inpatient Hospital Stay (HOSPITAL_COMMUNITY): Payer: Medicare HMO

## 2023-05-15 LAB — BASIC METABOLIC PANEL
Anion gap: 9 (ref 5–15)
BUN: 27 mg/dL — ABNORMAL HIGH (ref 8–23)
CO2: 25 mmol/L (ref 22–32)
Calcium: 9.2 mg/dL (ref 8.9–10.3)
Chloride: 100 mmol/L (ref 98–111)
Creatinine, Ser: 1.28 mg/dL — ABNORMAL HIGH (ref 0.61–1.24)
GFR, Estimated: >60 mL/min (ref >60)
Glucose, Bld: 182 mg/dL — ABNORMAL HIGH (ref 70–99)
Potassium: 5.2 mmol/L — ABNORMAL HIGH (ref 3.5–5.1)
Sodium: 134 mmol/L — ABNORMAL LOW (ref 135–145)

## 2023-05-15 LAB — GLUCOSE, CAPILLARY
Glucose-Capillary: 127 mg/dL — ABNORMAL HIGH (ref 70–99)
Glucose-Capillary: 143 mg/dL — ABNORMAL HIGH (ref 70–99)
Glucose-Capillary: 158 mg/dL — ABNORMAL HIGH (ref 70–99)
Glucose-Capillary: 185 mg/dL — ABNORMAL HIGH (ref 70–99)

## 2023-05-15 LAB — SURGICAL PATHOLOGY

## 2023-05-15 MED ORDER — POLYETHYLENE GLYCOL 3350 17 G PO PACK
17.0000 g | PACK | Freq: Every day | ORAL | Status: DC
  Administered 2023-05-15 – 2023-05-17 (×3): 17 g via ORAL
  Filled 2023-05-15 (×3): qty 1

## 2023-05-15 NOTE — Progress Notes (Signed)
Mobility Specialist Progress Note:   05/15/23 1000  Mobility  Activity Ambulated with assistance in hallway  Level of Assistance Contact guard assist, steadying assist  Assistive Device Front wheel walker  Distance Ambulated (ft) 340 ft  Activity Response Tolerated well  Mobility Referral Yes  Mobility visit 1 Mobility  Mobility Specialist Start Time (ACUTE ONLY) [id number]  Mobility Specialist Stop Time (ACUTE ONLY) 1014  Mobility Specialist Time Calculation (min) (ACUTE ONLY) 16 min    Pre Mobility: 91 HR,  92% SpO2  Pt received in bed, agreeable to mobility. No complaints throughout. During ambulation, pt experienced urinary incontinence. Reports this has been normal since his prostate surgery. Pt left on EOB with call bell and all needs met.  Douglas Zhang Mobility Specialist Please contact via Special educational needs teacher or Rehab office at [phone]

## 2023-05-15 NOTE — Plan of Care (Signed)
  Problem: Education: Goal: Knowledge of General Education information will improve Description: Including pain rating scale, medication(s)/side effects and non-pharmacologic comfort measures Outcome: Progressing   Problem: Health Behavior/Discharge Planning: Goal: Ability to manage health-related needs will improve Outcome: Progressing   Problem: Clinical Measurements: Goal: Ability to maintain clinical measurements within normal limits will improve Outcome: Progressing Goal: Will remain free from infection Outcome: Progressing Goal: Diagnostic test results will improve Outcome: Progressing Goal: Respiratory complications will improve Outcome: Progressing Goal: Cardiovascular complication will be avoided Outcome: Progressing   Problem: Activity: Goal: Risk for activity intolerance will decrease Outcome: Progressing   Problem: Nutrition: Goal: Adequate nutrition will be maintained Outcome: Progressing   Problem: Coping: Goal: Level of anxiety will decrease Outcome: Progressing   Problem: Elimination: Goal: Will not experience complications related to bowel motility Outcome: Progressing Goal: Will not experience complications related to urinary retention Outcome: Progressing   Problem: Pain Management: Goal: General experience of comfort will improve Outcome: Progressing   Problem: Safety: Goal: Ability to remain free from injury will improve Outcome: Progressing   Problem: Skin Integrity: Goal: Risk for impaired skin integrity will decrease Outcome: Progressing   Problem: Education: Goal: Knowledge of disease or condition will improve Outcome: Progressing Goal: Knowledge of the prescribed therapeutic regimen will improve Outcome: Progressing   Problem: Activity: Goal: Risk for activity intolerance will decrease Outcome: Progressing   Problem: Cardiac: Goal: Will achieve and/or maintain hemodynamic stability Outcome: Progressing   Problem: Clinical  Measurements: Goal: Postoperative complications will be avoided or minimized Outcome: Progressing   Problem: Respiratory: Goal: Respiratory status will improve Outcome: Progressing   Problem: Pain Management: Goal: Pain level will decrease Outcome: Progressing   Problem: Skin Integrity: Goal: Wound healing without signs and symptoms infection will improve Outcome: Progressing   Problem: Education: Goal: Ability to describe self-care measures that may prevent or decrease complications (Diabetes Survival Skills Education) will improve Outcome: Progressing Goal: Individualized Educational Video(s) Outcome: Progressing   Problem: Coping: Goal: Ability to adjust to condition or change in health will improve Outcome: Progressing   Problem: Fluid Volume: Goal: Ability to maintain a balanced intake and output will improve Outcome: Progressing   Problem: Health Behavior/Discharge Planning: Goal: Ability to identify and utilize available resources and services will improve Outcome: Progressing Goal: Ability to manage health-related needs will improve Outcome: Progressing   Problem: Metabolic: Goal: Ability to maintain appropriate glucose levels will improve Outcome: Progressing   Problem: Nutritional: Goal: Maintenance of adequate nutrition will improve Outcome: Progressing Goal: Progress toward achieving an optimal weight will improve Outcome: Progressing   Problem: Skin Integrity: Goal: Risk for impaired skin integrity will decrease Outcome: Progressing   Problem: Tissue Perfusion: Goal: Adequacy of tissue perfusion will improve Outcome: Progressing

## 2023-05-15 NOTE — Progress Notes (Addendum)
      [address]       [place] [address]             [phone]       4 Days Post-Op Procedure(s) (LRB): XI ROBOTIC ASSISTED THORACOSCOPY-LEFT UPPER LOBECTOMY (Left) NODE DISSECTION (Left) INTERCOSTAL NERVE BLOCK (Left)  Subjective: Patient drinking coffee this am. He has no specific complaint. He is walking daily without problems.  Objective: Vital signs in last 24 hours: Temp:  [98 F (36.7 C)-98.7 F (37.1 C)] 98.1 F (36.7 C) (12/10 0433) Pulse Rate:  [72-90] 84 (12/10 0433) Cardiac Rhythm: Heart block (12/09 1900) Resp:  [13-17] 13 (12/10 0433) BP: (117-148)/(71-85) 136/82 (12/10 0433) SpO2:  [93 %-98 %] 94 % (12/10 0433)     Intake/Output from previous day: 12/09 0701 - 12/10 0700 In: 960 [P.O.:960] Out: 2575 [Urine:2450; Chest Tube:125]   Physical Exam:  Cardiovascular: RRR Pulmonary: Clear to auscultation bilaterally. Ecchymosis left chest wall/upper thigh. Abdomen: Soft, non tender, bowel sounds present. Extremities: Mild bilateral lower extremity edema. Wounds: Clean and dry.  No erythema or signs of infection. Sero sanguinous drainage from around chest tube.  Back wound had dermabond removed when chest tube re taped so wound is draining light blood as is superficially dehisced Chest Tube: to water seal, tidling but no air leak with cough  Lab Results: CBC: Recent Labs    05/13/23 0202 05/14/23 0157  WBC 7.1 6.0  HGB 9.6* 10.2*  HCT 28.7* 30.5*  PLT 122* 152   BMET:  Recent Labs    05/13/23 0202 05/14/23 0157  NA 134* 135  K 4.2 4.9  CL 104 104  CO2 23 25  GLUCOSE 150* 153*  BUN 41* 32*  CREATININE 1.59* 1.36*  CALCIUM 8.3* 8.6*    PT/INR: No results for input(s): "LABPROT", "INR" in the last 72 hours. ABG:  INR: Will add last result for INR, ABG once components are confirmed Will add last 4 CBG results once components are confirmed  Assessment/Plan:  1. CV - Tachycardic earlier this am. SR, first degree  heart block.  2.  Pulmonary - On room air. Chest tube with 125 cc last 24 hours. Chest tube to water seal, tidling but no air leak with cough. Await this am's CXR.? Remove chest tube. Encourage incentive spirometer. 3. Creatinine this am decreased from 1.59 to 1.36. Creatinine prior to surgery was 1.3. Toradol stopped yesterday. 4. DM-CBGs 347/145/127. On Insulin. Will restart Glipizide XL and Metformin at discharge 5. On Lovenox for DVT prophylaxis  Donielle M ZimmermanPA-C 05/15/2023,7:10 AM  Patient seen and examined, agree with above No air leak- dc chest tube Probably home tomorrow  Viviann Spare C. Dorris Fetch, MD [facility] [phone]

## 2023-05-16 ENCOUNTER — Inpatient Hospital Stay (HOSPITAL_COMMUNITY): Payer: Medicare HMO

## 2023-05-16 LAB — GLUCOSE, CAPILLARY
Glucose-Capillary: 187 mg/dL — ABNORMAL HIGH (ref 70–99)
Glucose-Capillary: 142 mg/dL — ABNORMAL HIGH (ref 70–99)
Glucose-Capillary: 130 mg/dL — ABNORMAL HIGH (ref 70–99)
Glucose-Capillary: 151 mg/dL — ABNORMAL HIGH (ref 70–99)

## 2023-05-16 LAB — BASIC METABOLIC PANEL
Anion gap: 7 (ref 5–15)
BUN: 28 mg/dL — ABNORMAL HIGH (ref 8–23)
CO2: 27 mmol/L (ref 22–32)
Calcium: 9.2 mg/dL (ref 8.9–10.3)
Chloride: 98 mmol/L (ref 98–111)
Creatinine, Ser: 1.32 mg/dL — ABNORMAL HIGH (ref 0.61–1.24)
GFR, Estimated: 59 mL/min — ABNORMAL LOW (ref >60)
Glucose, Bld: 204 mg/dL — ABNORMAL HIGH (ref 70–99)
Potassium: 5 mmol/L (ref 3.5–5.1)
Sodium: 132 mmol/L — ABNORMAL LOW (ref 135–145)

## 2023-05-16 MED ORDER — AMIODARONE HCL 200 MG PO TABS
400.0000 mg | ORAL_TABLET | Freq: Two times a day (BID) | ORAL | Status: DC
  Administered 2023-05-16 – 2023-05-17 (×3): 400 mg via ORAL
  Filled 2023-05-16 (×3): qty 2

## 2023-05-16 MED ORDER — AMIODARONE IV BOLUS ONLY 150 MG/100ML
150.0000 mg | Freq: Once | INTRAVENOUS | Status: AC
  Administered 2023-05-16: 150 mg via INTRAVENOUS
  Filled 2023-05-16: qty 100

## 2023-05-16 MED ORDER — METOPROLOL SUCCINATE ER 25 MG PO TB24
25.0000 mg | ORAL_TABLET | Freq: Every day | ORAL | Status: DC
  Administered 2023-05-16 – 2023-05-17 (×2): 25 mg via ORAL
  Filled 2023-05-16 (×2): qty 1

## 2023-05-16 MED ORDER — METFORMIN HCL ER 500 MG PO TB24
1000.0000 mg | ORAL_TABLET | Freq: Two times a day (BID) | ORAL | Status: DC
  Administered 2023-05-16 – 2023-05-17 (×3): 1000 mg via ORAL
  Filled 2023-05-16 (×3): qty 2

## 2023-05-16 MED ORDER — SUMATRIPTAN SUCCINATE 100 MG PO TABS: 100.0000 mg | ORAL_TABLET | ORAL | Status: DC | PRN

## 2023-05-16 MED ORDER — METOPROLOL TARTRATE 5 MG/5ML IV SOLN
2.5000 mg | Freq: Once | INTRAVENOUS | Status: AC
  Administered 2023-05-16: 2.5 mg via INTRAVENOUS
  Filled 2023-05-16: qty 5

## 2023-05-16 MED ORDER — GLIPIZIDE ER 10 MG PO TB24
10.0000 mg | ORAL_TABLET | Freq: Every day | ORAL | Status: DC
  Administered 2023-05-16 – 2023-05-17 (×2): 10 mg via ORAL
  Filled 2023-05-16 (×2): qty 1

## 2023-05-16 MED ORDER — FUROSEMIDE 20 MG PO TABS: 20.0000 mg | ORAL_TABLET | Freq: Every day | ORAL | Status: DC

## 2023-05-16 MED ORDER — IRBESARTAN 75 MG PO TABS
37.5000 mg | ORAL_TABLET | Freq: Every day | ORAL | Status: DC
  Administered 2023-05-16 – 2023-05-17 (×2): 37.5 mg via ORAL
  Filled 2023-05-16 (×2): qty 0.5

## 2023-05-16 MED ORDER — FUROSEMIDE 40 MG PO TABS
40.0000 mg | ORAL_TABLET | Freq: Every day | ORAL | Status: DC
Start: 2023-05-16 — End: 2023-05-17
  Administered 2023-05-16 – 2023-05-17 (×2): 40 mg via ORAL
  Filled 2023-05-16 (×2): qty 1

## 2023-05-16 NOTE — Progress Notes (Signed)
Mobility Specialist Progress Note:   05/16/23 1200  Mobility  Activity Ambulated with assistance in hallway  Level of Assistance Contact guard assist, steadying assist  Assistive Device Front wheel walker  Distance Ambulated (ft) 340 ft  Activity Response Tolerated well  Mobility Referral Yes  Mobility visit 1 Mobility  Mobility Specialist Start Time (ACUTE ONLY) 1157  Mobility Specialist Stop Time (ACUTE ONLY) 1210  Mobility Specialist Time Calculation (min) (ACUTE ONLY) 13 min    Pre Mobility: 89 HR, 94% SpO2 Post Mobility:  92 HR,  95% SpO2  Received pt in bed having no complaints and agreeable to mobility. Pt was asymptomatic throughout ambulation and returned to room w/o fault. Left in bed w/ call bell in reach and all needs met.   D'Vante Earlene Plater Mobility Specialist Please contact via Special educational needs teacher or Rehab office at [phone]

## 2023-05-16 NOTE — Progress Notes (Addendum)
      [address]       [place] [address]             [phone]       5 Days Post-Op Procedure(s) (LRB): XI ROBOTIC ASSISTED THORACOSCOPY-LEFT UPPER LOBECTOMY (Left) NODE DISSECTION (Left) INTERCOSTAL NERVE BLOCK (Left)  Subjective: Patient lying in bed with no specific complaint this am.  Objective: Vital signs in last 24 hours: Temp:  [98 F (36.7 C)-99 F (37.2 C)] 98.7 F (37.1 C) (12/11 0556) Pulse Rate:  [86-101] 93 (12/11 0556) Cardiac Rhythm: Heart block (12/10 1904) Resp:  [13-16] 13 (12/11 0556) BP: (115-147)/(73-92) 133/84 (12/11 0556) SpO2:  [91 %-98 %] 97 % (12/10 2357)     Intake/Output from previous day: 12/10 0701 - 12/11 0700 In: 600 [P.O.:600] Out: 500 [Urine:500]   Physical Exam:  Cardiovascular: Tachycardic Pulmonary: Clear to auscultation bilaterally. Ecchymosis left chest wall/upper thigh. Abdomen: Soft, non tender, bowel sounds present. Extremities: No lower extremity edema. Wounds: Clean and dry.  No erythema or signs of infection.   Back wound that is dehisced is mostly dry this am. Scant sero sanguinous drainage on dressing from chest tube wound.   Lab Results: CBC: Recent Labs    05/14/23 0157  WBC 6.0  HGB 10.2*  HCT 30.5*  PLT 152   BMET:  Recent Labs    05/14/23 0157 05/15/23 0730  NA 135 134*  K 4.9 5.2*  CL 104 100  CO2 25 25  GLUCOSE 153* 182*  BUN 32* 27*  CREATININE 1.36* 1.28*  CALCIUM 8.6* 9.2    PT/INR: No results for input(s): "LABPROT", "INR" in the last 72 hours. ABG:  INR: Will add last result for INR, ABG once components are confirmed Will add last 4 CBG results once components are confirmed  Assessment/Plan:  1. CV - Appears to have had some a fib/flutter. HR in the 110's this am. As discussed with Dr. Dorris Fetch, EKG,will give Amiodarone bolus followed by 400 mg bid. Will give IV Lopressor 2.5 once then start Toprol XL 25 mg daily. Will restart low dose Diovan as BP 130's+  as well. 2.  Pulmonary - On room air. Chest tube removed yesterday. CXR this am appears stable (small left pleural effusion). Encourage incentive spirometer.  3. Creatinine yesterday decreased from 1.36 to 1.28. Creatinine prior to surgery was 1.3.  Chronic Kidney Disease   Stage I     GFR >90  Stage II    GFR 60-89  Stage IIIA GFR 45-59  Stage IIIB GFR 30-44  Stage IV   GFR 15-29  Stage V    GFR  <15  Lab Results  Component Value Date   CREATININE 1.32 (H) 05/16/2023   Estimated Creatinine Clearance: 53.6 mL/min (A) (by C-G formula based on SCr of 1.32 mg/dL (H)).  4. DM-CBGs 158/185/187. On Insulin. Will restart Glipizide XL now and Metformin at discharge 5. On Lovenox for DVT prophylaxis  Lelon [facility] 05/16/2023,7:08 AM  Patient seen and examined on rounds with Ms. Joycelyn Man earlier today, agree with above findings and plan.  Tachycardic, appears to be ST vs A flutter, has some definite atrial fib and flutter earlier on review of strips. Path- metastatic prostate CA  Viviann Spare C. Dorris Fetch, MD [facility] [phone]

## 2023-05-16 NOTE — Plan of Care (Signed)
  Problem: Education: Goal: Knowledge of General Education information will improve Description: Including pain rating scale, medication(s)/side effects and non-pharmacologic comfort measures Outcome: Progressing   Problem: Health Behavior/Discharge Planning: Goal: Ability to manage health-related needs will improve Outcome: Progressing   Problem: Clinical Measurements: Goal: Ability to maintain clinical measurements within normal limits will improve Outcome: Progressing Goal: Will remain free from infection Outcome: Progressing Goal: Diagnostic test results will improve Outcome: Progressing Goal: Respiratory complications will improve Outcome: Progressing Goal: Cardiovascular complication will be avoided Outcome: Progressing   Problem: Activity: Goal: Risk for activity intolerance will decrease Outcome: Progressing   Problem: Nutrition: Goal: Adequate nutrition will be maintained Outcome: Progressing   Problem: Coping: Goal: Level of anxiety will decrease Outcome: Progressing   Problem: Elimination: Goal: Will not experience complications related to bowel motility Outcome: Progressing Goal: Will not experience complications related to urinary retention Outcome: Progressing   Problem: Pain Management: Goal: General experience of comfort will improve Outcome: Progressing   Problem: Safety: Goal: Ability to remain free from injury will improve Outcome: Progressing   Problem: Skin Integrity: Goal: Risk for impaired skin integrity will decrease Outcome: Progressing   Problem: Education: Goal: Knowledge of disease or condition will improve Outcome: Progressing Goal: Knowledge of the prescribed therapeutic regimen will improve Outcome: Progressing   Problem: Activity: Goal: Risk for activity intolerance will decrease Outcome: Progressing   Problem: Cardiac: Goal: Will achieve and/or maintain hemodynamic stability Outcome: Progressing   Problem: Clinical  Measurements: Goal: Postoperative complications will be avoided or minimized Outcome: Progressing   Problem: Respiratory: Goal: Respiratory status will improve Outcome: Progressing   Problem: Pain Management: Goal: Pain level will decrease Outcome: Progressing   Problem: Skin Integrity: Goal: Wound healing without signs and symptoms infection will improve Outcome: Progressing   Problem: Education: Goal: Ability to describe self-care measures that may prevent or decrease complications (Diabetes Survival Skills Education) will improve Outcome: Progressing Goal: Individualized Educational Video(s) Outcome: Progressing   Problem: Coping: Goal: Ability to adjust to condition or change in health will improve Outcome: Progressing   Problem: Fluid Volume: Goal: Ability to maintain a balanced intake and output will improve Outcome: Progressing   Problem: Health Behavior/Discharge Planning: Goal: Ability to identify and utilize available resources and services will improve Outcome: Progressing Goal: Ability to manage health-related needs will improve Outcome: Progressing   Problem: Metabolic: Goal: Ability to maintain appropriate glucose levels will improve Outcome: Progressing   Problem: Nutritional: Goal: Maintenance of adequate nutrition will improve Outcome: Progressing Goal: Progress toward achieving an optimal weight will improve Outcome: Progressing   Problem: Skin Integrity: Goal: Risk for impaired skin integrity will decrease Outcome: Progressing   Problem: Tissue Perfusion: Goal: Adequacy of tissue perfusion will improve Outcome: Progressing

## 2023-05-17 ENCOUNTER — Inpatient Hospital Stay (HOSPITAL_COMMUNITY): Payer: Medicare HMO

## 2023-05-17 LAB — GLUCOSE, CAPILLARY: Glucose-Capillary: 157 mg/dL — ABNORMAL HIGH (ref 70–99)

## 2023-05-17 MED ORDER — VALSARTAN 40 MG PO TABS: 40.0000 mg | ORAL_TABLET | Freq: Every day | ORAL | 1 refills | Status: AC

## 2023-05-17 MED ORDER — OXYCODONE HCL 5 MG PO TABS: 5.0000 mg | ORAL_TABLET | Freq: Four times a day (QID) | ORAL | 0 refills | Status: DC | PRN

## 2023-05-17 MED ORDER — AMIODARONE HCL 200 MG PO TABS: ORAL_TABLET | ORAL | 1 refills | Status: DC

## 2023-05-17 MED ORDER — GABAPENTIN 300 MG PO CAPS: 300.0000 mg | ORAL_CAPSULE | Freq: Every day | ORAL | 0 refills | Status: DC

## 2023-05-17 MED ORDER — METOPROLOL SUCCINATE ER 25 MG PO TB24: 25.0000 mg | ORAL_TABLET | Freq: Every day | ORAL | 1 refills | Status: AC

## 2023-05-17 NOTE — Plan of Care (Signed)
  Problem: Education: Goal: Knowledge of General Education information will improve Description: Including pain rating scale, medication(s)/side effects and non-pharmacologic comfort measures Outcome: Progressing   Problem: Health Behavior/Discharge Planning: Goal: Ability to manage health-related needs will improve Outcome: Progressing   Problem: Clinical Measurements: Goal: Ability to maintain clinical measurements within normal limits will improve Outcome: Progressing Goal: Will remain free from infection Outcome: Progressing Goal: Diagnostic test results will improve Outcome: Progressing Goal: Respiratory complications will improve Outcome: Progressing Goal: Cardiovascular complication will be avoided Outcome: Progressing   Problem: Activity: Goal: Risk for activity intolerance will decrease Outcome: Progressing   Problem: Nutrition: Goal: Adequate nutrition will be maintained Outcome: Progressing   Problem: Coping: Goal: Level of anxiety will decrease Outcome: Progressing   Problem: Elimination: Goal: Will not experience complications related to bowel motility Outcome: Progressing Goal: Will not experience complications related to urinary retention Outcome: Progressing   Problem: Pain Management: Goal: General experience of comfort will improve Outcome: Progressing   Problem: Safety: Goal: Ability to remain free from injury will improve Outcome: Progressing   Problem: Skin Integrity: Goal: Risk for impaired skin integrity will decrease Outcome: Progressing   Problem: Education: Goal: Knowledge of disease or condition will improve Outcome: Progressing Goal: Knowledge of the prescribed therapeutic regimen will improve Outcome: Progressing   Problem: Activity: Goal: Risk for activity intolerance will decrease Outcome: Progressing   Problem: Cardiac: Goal: Will achieve and/or maintain hemodynamic stability Outcome: Progressing   Problem: Clinical  Measurements: Goal: Postoperative complications will be avoided or minimized Outcome: Progressing   Problem: Respiratory: Goal: Respiratory status will improve Outcome: Progressing   Problem: Pain Management: Goal: Pain level will decrease Outcome: Progressing   Problem: Skin Integrity: Goal: Wound healing without signs and symptoms infection will improve Outcome: Progressing   Problem: Education: Goal: Ability to describe self-care measures that may prevent or decrease complications (Diabetes Survival Skills Education) will improve Outcome: Progressing Goal: Individualized Educational Video(s) Outcome: Progressing   Problem: Coping: Goal: Ability to adjust to condition or change in health will improve Outcome: Progressing   Problem: Fluid Volume: Goal: Ability to maintain a balanced intake and output will improve Outcome: Progressing   Problem: Health Behavior/Discharge Planning: Goal: Ability to identify and utilize available resources and services will improve Outcome: Progressing Goal: Ability to manage health-related needs will improve Outcome: Progressing   Problem: Metabolic: Goal: Ability to maintain appropriate glucose levels will improve Outcome: Progressing   Problem: Nutritional: Goal: Maintenance of adequate nutrition will improve Outcome: Progressing Goal: Progress toward achieving an optimal weight will improve Outcome: Progressing   Problem: Skin Integrity: Goal: Risk for impaired skin integrity will decrease Outcome: Progressing   Problem: Tissue Perfusion: Goal: Adequacy of tissue perfusion will improve Outcome: Progressing

## 2023-05-17 NOTE — TOC Transition Note (Signed)
Transition of Care [facility]) - Discharge Note   Patient Details  Name: Douglas Zhang MRN: [id number] Date of Birth: May 03, 1956  [facility] CM/SW Contact:  Harriet Masson, RN Phone Number: 05/17/2023, 9:23 AM   Clinical Narrative:    Patient stable for discharge.  No TOC needs at this time.    Final next level of care: Home/Self Care Barriers to Discharge: Barriers Resolved   Patient Goals and CMS Choice Patient states their goals for this hospitalization and ongoing recovery are:: return home          Discharge Placement               home        Discharge Plan and Services Additional resources added to the After Visit Summary for                                       Social Drivers of Health (SDOH) Interventions SDOH Screenings   Food Insecurity: No Food Insecurity (05/11/2023)  Housing: Patient Declined (05/17/2023)  Transportation Needs: No Transportation Needs (05/11/2023)  Utilities: Not At Risk (05/11/2023)  Alcohol Screen: Low Risk  (04/09/2023)  Depression (PHQ2-9): Low Risk  (04/09/2023)  Financial Resource Strain: Low Risk  (05/03/2022)   Received from [facility]  Physical Activity: Insufficiently Active (05/03/2022)   Received from [facility]  Social Connections: Socially Integrated (05/03/2022)   Received from [facility]  Stress: No Stress Concern Present (11/15/2022)   Received from [facility]  Tobacco Use: Medium Risk (05/14/2023)     Readmission Risk Interventions    05/17/2023    9:23 AM  Readmission Risk Prevention Plan  Transportation Screening Complete  PCP or Specialist Appt within 5-7 Days Not Complete  Not Complete comments apt with dr Dorris Fetch 05/28/23  Home Care Screening Complete  Medication Review (RN CM) Complete

## 2023-05-17 NOTE — Progress Notes (Signed)
      [address]       [place] [address]             [phone]       6 Days Post-Op Procedure(s) (LRB): XI ROBOTIC ASSISTED THORACOSCOPY-LEFT UPPER LOBECTOMY (Left) NODE DISSECTION (Left) INTERCOSTAL NERVE BLOCK (Left)  Subjective: Patient eating breakfast. He has no complaints and hopes to go home.  Objective: Vital signs in last 24 hours: Temp:  [97.7 F (36.5 C)-98.8 F (37.1 C)] 98.8 F (37.1 C) (12/12 0325) Pulse Rate:  [67-112] 75 (12/12 0325) Cardiac Rhythm: Normal sinus rhythm (12/11 0800) Resp:  [12-20] 17 (12/12 0325) BP: (106-126)/(70-78) 123/78 (12/12 0325) SpO2:  [91 %-96 %] 94 % (12/12 0325)     Intake/Output from previous day: 12/11 0701 - 12/12 0700 In: 332.9 [P.O.:240; I.V.:92.9] Out: 600 [Urine:600]   Physical Exam:  Cardiovascular: RRR Pulmonary: Clear to auscultation bilaterally. Ecchymosis left chest wall/upper thigh. Abdomen: Soft, non tender, bowel sounds present. Extremities: No lower extremity edema. Wounds: Clean and dry.  No erythema or signs of infection.   Back wound that is dehisced is mostly dry this am. No drainage on dressing from chest tube wound this am.   Lab Results: CBC: No results for input(s): "WBC", "HGB", "HCT", "PLT" in the last 72 hours.  BMET:  Recent Labs    05/15/23 0730 05/16/23 0909  NA 134* 132*  K 5.2* 5.0  CL 100 98  CO2 25 27  GLUCOSE 182* 204*  BUN 27* 28*  CREATININE 1.28* 1.32*  CALCIUM 9.2 9.2    PT/INR: No results for input(s): "LABPROT", "INR" in the last 72 hours. ABG:  INR: Will add last result for INR, ABG once components are confirmed Will add last 4 CBG results once components are confirmed  Assessment/Plan:  1. CV - He had  a fib/flutter previously and  HR in the 110's yesterday (appeared ST). He was given Amiodarone bolus and Lopressor IV. On Amiodarone 400 mg bid, Toprol XL 25 mg daily, and low dose Avapro (substitute for Diovan). 2.  Pulmonary - On room  air. CXR this am appears stable. Encourage incentive spirometer.  3. Creatinine yesterday decreased from 1.36 to 1.28. Creatinine prior to surgery was 1.3.  Chronic Kidney Disease   Stage I     GFR >90  Stage II    GFR 60-89  Stage IIIA GFR 45-59  Stage IIIB GFR 30-44  Stage IV   GFR 15-29  Stage V    GFR  <15  Lab Results  Component Value Date   CREATININE 1.32 (H) 05/16/2023   Estimated Creatinine Clearance: 53.6 mL/min (A) (by C-G formula based on SCr of 1.32 mg/dL (H)).  4. DM-CBGs 130/151/157. On Insulin. On Glipizide XL (as taken prior to surgery) and will restart Metformin at discharge 5. On Lovenox for DVT prophylaxis 6. Hope to discharge  Emillie Chasen M ZimmermanPA-C 05/17/2023,6:58 AM

## 2023-05-23 ENCOUNTER — Other Ambulatory Visit: Payer: Self-pay | Admitting: Thoracic Surgery (Cardiothoracic Vascular Surgery)

## 2023-05-23 DIAGNOSIS — C3490 Malignant neoplasm of unspecified part of unspecified bronchus or lung: Secondary | ICD-10-CM

## 2023-05-28 ENCOUNTER — Encounter: Payer: Self-pay | Admitting: Thoracic Surgery (Cardiothoracic Vascular Surgery)

## 2023-05-28 ENCOUNTER — Ambulatory Visit
Admission: RE | Admit: 2023-05-28 | Discharge: 2023-05-28 | Disposition: A | Discharge status: Home / Self Care | Payer: Medicare HMO | Source: Ambulatory Visit | Attending: Thoracic Surgery (Cardiothoracic Vascular Surgery) | Admitting: Thoracic Surgery (Cardiothoracic Vascular Surgery)

## 2023-05-28 ENCOUNTER — Ambulatory Visit (INDEPENDENT_AMBULATORY_CARE_PROVIDER_SITE_OTHER): Payer: Self-pay | Admitting: Thoracic Surgery (Cardiothoracic Vascular Surgery)

## 2023-05-28 VITALS — BP 101/67 | HR 72 | Resp 20 | Wt 168.0 lb

## 2023-05-28 DIAGNOSIS — C3490 Malignant neoplasm of unspecified part of unspecified bronchus or lung: Secondary | ICD-10-CM

## 2023-05-28 DIAGNOSIS — Z09 Encounter for follow-up examination after completed treatment for conditions other than malignant neoplasm: Secondary | ICD-10-CM

## 2023-05-28 MED ORDER — OXYCODONE HCL 5 MG PO TABS: 5.0000 mg | ORAL_TABLET | Freq: Four times a day (QID) | ORAL | 0 refills | Status: AC | PRN

## 2023-05-28 NOTE — Progress Notes (Signed)
[address]       Douglas Zhang [address]             [phone]    HPI: Mr. Douglas Zhang returns for follow-up after recent left upper lobectomy.  Douglas Zhang is a 67 year old man with a history of remote tobacco use, hypertension, hyperlipidemia, reflux, type 2 diabetes, prostate cancer, and lung nodules.  Treated for prostate cancer in 2012.  Recent follow-up with Dr. Laverle Patter showed an elevated PSA.  Pylarify PET showed 3 lung nodules which had significant activity.  Navigational bronchoscopy with Dr. Delton Coombes showed non-small cell carcinoma.  Pathology cannot determine whether this was lung primary or prostate.  General consensus was that this was likely prostate.  After discussion with the multidisciplinary thoracic oncology clinic he was offered left upper lobectomy for definitive diagnosis and treatment of the left-sided nodules.  There also is a small right apical nodule.  He had a robotic assisted left upper lobectomy on 05/11/2023.  He had some atrial fibrillation postoperatively.  Converted to sinus with amiodarone and went home on oral amiodarone.  Pathology showed metastatic prostate cancer.  He has some incisional soreness.  He has been taking gabapentin at night and also taking oxycodone about every 6 hours or so during the day.  Has not been using Tylenol or NSAIDs.  No shortness of breath or swelling in his legs.  Past Medical History:  Diagnosis Date   AP (acute pancreatitis)    due to gallstones, history   Arthritis    Cataract    no surgery, just forming   COVID-19    Diabetes mellitus without complication (HCC)    ED (erectile dysfunction)    GERD (gastroesophageal reflux disease)    Headache(784.0)    migraines   History of hiatal hernia    Hx of migraines    Hyperlipidemia    Hypertension    Prostate cancer (HCC) 01/09/2011   biopsy-gleason 4+4=8   Radiation 07/03/2012-08/21/2012   prostate 6600 cGy 33 sessions   Stress incontinence, male      Current Outpatient Medications  Medication Sig Dispense Refill   ACCU-CHEK GUIDE test strip USE 1  ONCE DAILY     amiodarone (PACERONE) 200 MG tablet Take 400 mg two times daily for 2 days;then take 200 mg two times daily for 7 days;then take Amiodarone 200 mg daily thereafter 60 tablet 1   Ascorbic Acid (VITAMIN C PO) Take 1 tablet by mouth daily.     aspirin EC 81 MG tablet Take 81 mg by mouth daily.     atorvastatin (LIPITOR) 40 MG tablet Take 40 mg by mouth daily.     Blood Glucose Monitoring Suppl (ACCU-CHEK GUIDE) w/Device KIT Use to check BS daily     calcium citrate-vitamin D (CITRACAL+D) 315-200 MG-UNIT per tablet Take 1 tablet by mouth 2 (two) times daily.     cetirizine (ZYRTEC) 10 MG tablet Take 10 mg by mouth daily.     Cyanocobalamin (B-12 PO) Take 1 capsule by mouth daily.     gabapentin (NEURONTIN) 300 MG capsule Take 1 capsule (300 mg total) by mouth at bedtime. 30 capsule 0   glipiZIDE (GLUCOTROL XL) 10 MG 24 hr tablet Take 10 mg by mouth daily.     magnesium oxide (MAG-OX) 400 MG tablet Take 800 mg by mouth daily.     metFORMIN (GLUCOPHAGE-XR) 500 MG 24 hr tablet Take 1,000 mg by mouth 2 (two) times daily.  metoprolol succinate (TOPROL-XL) 25 MG 24 hr tablet Take 1 tablet (25 mg total) by mouth daily. 30 tablet 1   Microlet Lancets MISC USE 1  TO CHECK GLUCOSE ONCE DAILY TO  CHECK  GLUCOSE     montelukast (SINGULAIR) 10 MG tablet Take 10 mg by mouth at bedtime.     Multiple Vitamins-Minerals (CENTRUM SILVER 50+MEN) TABS Take 1 tablet by mouth daily.     Multiple Vitamins-Minerals (ZINC PO) Take 1 tablet by mouth daily.     MYRBETRIQ 50 MG TB24 tablet Take 50 mg by mouth daily.      omeprazole (PRILOSEC) 20 MG capsule Take 20 mg by mouth daily as needed (indigestion).      OZEMPIC, 0.25 OR 0.5 MG/DOSE, 2 MG/3ML SOPN Inject 0.5 mg as directed every Sunday.     Pseudoephedrine-APAP-DM (DAYQUIL PO) Take 2 capsules by mouth daily as needed (congestion).     SUMAtriptan  (IMITREX) 100 MG tablet Take 100 mg by mouth every 2 (two) hours as needed for migraine (migraine).      triamcinolone ointment (KENALOG) 0.1 % APPLY  OINTMENT TOPICALLY TWICE DAILY     valsartan (DIOVAN) 40 MG tablet Take 1 tablet (40 mg total) by mouth daily. 30 tablet 1   VITAMIN D PO Take 1 capsule by mouth daily.     oxyCODONE (OXY IR/ROXICODONE) 5 MG immediate release tablet Take 1 tablet (5 mg total) by mouth every 6 (six) hours as needed for severe pain (pain score 7-10). 30 tablet 0   No current facility-administered medications for this visit.    Physical Exam BP 101/67   Pulse 72   Resp 20   Wt 168 lb (76.2 kg)   SpO2 97% Comment: RA  BMI 24.13 kg/m  67 year old man in no acute distress Alert and oriented x 3 with no focal deficits Lungs slightly diminished at left base otherwise clear Incisions clean dry and intact Cardiac regular rate and rhythm  Diagnostic Tests:  CHEST - 2 VIEW   COMPARISON:  May 17, 2023.   FINDINGS: The heart size and mediastinal contours are within normal limits. Stable left basilar scarring is noted. Elevated left hemidiaphragm is noted. No consolidative process is noted. Old lower thoracic compression fracture is noted.   IMPRESSION: Stable left basilar scarring with elevated left hemidiaphragm.     Electronically Signed   By: Lupita Raider M.D.   On: 05/28/2023 13:22 I personally reviewed the chest x-ray images.  Postop changes present.  No concerning findings.  Impression: Douglas Zhang is a 67 year old man with a history of remote tobacco use, hypertension, hyperlipidemia, reflux, type 2 diabetes, prostate cancer, and metastatic prostate cancer to the lung.  Status post left upper lobectomy.  He is only 2-1/2 weeks out and looks great.  Does have some incisional pain and has been using oxycodone about 3 times a day.  I went ahead and refilled that medication for him.  He has been using gabapentin at night.  He has not been  using Tylenol at all and I have recommended that he use that as a baseline and then only use the oxycodone when he is having pain well above that baseline.  He understands we want a wean him off the oxycodone over the next month or so.  He still has a small right upper lobe nodule.  He would prefer stereotactic radiation for that rather than undergoing another operation.  Will have him follow-up with Dr. Kathrynn Running.  Plan: Follow-up with  Dr. Kathrynn Running regarding possible stereotactic radiation Follow-up with Dr. Laverle Patter I will plan to see him back in a month with a chest x-ray to check on his progress  Loreli Slot, MD [facility] [phone]

## 2023-06-10 ENCOUNTER — Other Ambulatory Visit: Payer: Self-pay | Admitting: Physician Assistant

## 2023-06-13 ENCOUNTER — Telehealth: Payer: Self-pay | Admitting: Radiation Oncology

## 2023-06-13 ENCOUNTER — Inpatient Hospital Stay: Payer: Medicare HMO | Attending: Internal Medicine | Admitting: Internal Medicine

## 2023-06-13 VITALS — BP 138/81 | HR 70 | Temp 97.9°F | Resp 16 | Ht 69.0 in | Wt 169.4 lb

## 2023-06-13 DIAGNOSIS — N529 Male erectile dysfunction, unspecified: Secondary | ICD-10-CM | POA: Insufficient documentation

## 2023-06-13 DIAGNOSIS — M47814 Spondylosis without myelopathy or radiculopathy, thoracic region: Secondary | ICD-10-CM | POA: Diagnosis not present

## 2023-06-13 DIAGNOSIS — Z7984 Long term (current) use of oral hypoglycemic drugs: Secondary | ICD-10-CM | POA: Insufficient documentation

## 2023-06-13 DIAGNOSIS — K219 Gastro-esophageal reflux disease without esophagitis: Secondary | ICD-10-CM | POA: Insufficient documentation

## 2023-06-13 DIAGNOSIS — R918 Other nonspecific abnormal finding of lung field: Secondary | ICD-10-CM | POA: Insufficient documentation

## 2023-06-13 DIAGNOSIS — C61 Malignant neoplasm of prostate: Secondary | ICD-10-CM | POA: Insufficient documentation

## 2023-06-13 DIAGNOSIS — Z79899 Other long term (current) drug therapy: Secondary | ICD-10-CM | POA: Diagnosis not present

## 2023-06-13 DIAGNOSIS — Z7982 Long term (current) use of aspirin: Secondary | ICD-10-CM | POA: Insufficient documentation

## 2023-06-13 DIAGNOSIS — Z923 Personal history of irradiation: Secondary | ICD-10-CM | POA: Diagnosis not present

## 2023-06-13 DIAGNOSIS — C7802 Secondary malignant neoplasm of left lung: Secondary | ICD-10-CM | POA: Diagnosis not present

## 2023-06-13 DIAGNOSIS — Z800 Family history of malignant neoplasm of digestive organs: Secondary | ICD-10-CM | POA: Insufficient documentation

## 2023-06-13 DIAGNOSIS — E1136 Type 2 diabetes mellitus with diabetic cataract: Secondary | ICD-10-CM | POA: Insufficient documentation

## 2023-06-13 DIAGNOSIS — Z7985 Long-term (current) use of injectable non-insulin antidiabetic drugs: Secondary | ICD-10-CM | POA: Insufficient documentation

## 2023-06-13 DIAGNOSIS — E785 Hyperlipidemia, unspecified: Secondary | ICD-10-CM | POA: Diagnosis not present

## 2023-06-13 DIAGNOSIS — I10 Essential (primary) hypertension: Secondary | ICD-10-CM | POA: Insufficient documentation

## 2023-06-13 DIAGNOSIS — K851 Biliary acute pancreatitis without necrosis or infection: Secondary | ICD-10-CM | POA: Insufficient documentation

## 2023-06-13 DIAGNOSIS — Z87891 Personal history of nicotine dependence: Secondary | ICD-10-CM | POA: Diagnosis not present

## 2023-06-13 NOTE — Telephone Encounter (Signed)
 1/8 @ 9:11 am Patient called to let someone know he will be running later for his follow up consult on tomorrow.  He will be here at 2:00 pm instead of 1:30 pm.  Secure chat sent to Ruel Favors and [employer], so they are aware.

## 2023-06-13 NOTE — Progress Notes (Signed)
 [facility]         [phone] ________________________________  Outpatient Re-Consultation  Name: Douglas Zhang MRN: [id number]  Date: 06/14/2023  DOB: 1955-11-21  REFERRING PHYSICIAN: Kerrin Elspeth BROCKS, *  DIAGNOSIS: 68 yo gentleman with oligometastatic prostate cancer involving the lungs; s/p prostatectomy in 2012 and salvage prostate fossa radiation in 2014.    ICD-10-CM   1. Malignant neoplasm of prostate metastatic to lung [facility])  C61    C78.00       HISTORY OF PRESENT ILLNESS::Douglas Zhang is a 68 y.o. male with a past medical history significant for prostate cancer and tobacco use who was referred back to us  04/09/2023 to discuss management of newly diagnosed pulmonary metastases, at the request of Dr. Shelah.  He is s/p RALP with Dr. Renda in 2012 for his pT3a Gleason 4+4=8 adenocarcinoma of the prostate with a focal positive soft tissue margin in the area near the left seminal vesicle (25 lymph nodes negative) and pretreatment PSA of 8.39.  His postoperative PSA remained detectable so he subsequently completed salvage radiotherapy to the prostatic fossa under the care of Dr. Jason in 2014.  His PSA became undetectable following salvage radiation and remained undetectable until 2016 when it became very low detectable at 0.02.  His PSA has continued to gradually rise since that time, up to 2.32 in December 2021 and up to 5.06 in May 2024. His most recent PSA levels are as follows:   Given his rising PSA levels, a PSMA PET was performed on 02/16/2023 for disease restaging and revealed three radiotracer avid pulmonary nodules, concerning for metastatic disease.  There was no evidence of local prostate recurrence or adenopathy visualized.  He subsequently underwent bronchoscopy and biopsy of all 3 nodules under the care of Dr. Shelah on 03/27/2023. 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 The cellblock was negative for TTF-1 and p40. Pathology  from the LUL, target #2 was suspicious but ultimately inconclusive, and tissue from the RUL was insufficient for diagnosis.   We initially met with the patient on 04/09/2023 to discuss his treatment options and recommendations from the recent case review in our multidisciplinary thoracic oncology conference.  The consensus recommendation was to proceed with surgical resection if the patient was felt to be a surgical candidate versus stereotactic body radiotherapy (SBRT) for focused treatment of all 3 nodules.  The patient met with Dr. Kerrin on 04/17/2023 and elected to proceed with left upper lobectomy and lymph node dissection for definitive treatment of the two LUL lung nodules and tissue confirmation.  Surgical pathology confirmed metastatic prostatic adenocarcinoma.  He has been kindly referred back to us  today for further discussion of stereotactic body radiotherapy (SBRT) for management of the remaining RUL lung nodule.  PREVIOUS RADIATION THERAPY: Yes   2014: Patient received 66Gy in 33 fractions to the prostate fossa (Dr. Jason)  Past Medical History:  Diagnosis Date   AP (acute pancreatitis)    due to gallstones, history   Arthritis    Cataract    no surgery, just forming   COVID-19    Diabetes mellitus without complication [facility])    ED (erectile dysfunction)    GERD (gastroesophageal reflux disease)    Headache(784.0)    migraines   History of hiatal hernia    Hx of migraines    Hyperlipidemia    Hypertension    Prostate cancer (HCC) 01/09/2011   biopsy-gleason 4+4=8   Radiation 07/03/2012-08/21/2012   prostate 6600 cGy 33  sessions   Stress incontinence, male   :   Past Surgical History:  Procedure Laterality Date   BRONCHIAL BIOPSY  03/27/2023   Procedure: BRONCHIAL BIOPSIES;  Surgeon: Shelah Lamar RAMAN, MD;  Location: [facility] ENDOSCOPY;  Service: Pulmonary;;   BRONCHIAL BRUSHINGS  03/27/2023   Procedure: BRONCHIAL BRUSHINGS;  Surgeon: Shelah Lamar RAMAN, MD;  Location: [facility]  ENDOSCOPY;  Service: Pulmonary;;   BRONCHIAL NEEDLE ASPIRATION BIOPSY  03/27/2023   Procedure: BRONCHIAL NEEDLE ASPIRATION BIOPSIES;  Surgeon: Shelah Lamar RAMAN, MD;  Location: [facility] ENDOSCOPY;  Service: Pulmonary;;   CATARACT EXTRACTION Bilateral    CHOLECYSTECTOMY  06/05/1988   CHOLECYSTECTOMY, LAPAROSCOPIC     CYSTOSCOPY WITH BIOPSY N/A 06/12/2013   Procedure: CYSTOSCOPY WITH BIOPSY bladder washing;  Surgeon: Noretta Ferrara, MD;  Location: [facility] ORS;  Service: Urology;  Laterality: N/A;  BLADDER BIOPSY CYTOLOGY EXAM UNDER ANESTHESIA     INGUINAL HERNIA REPAIR     bi-lateral   INTERCOSTAL NERVE BLOCK Left 05/11/2023   Procedure: INTERCOSTAL NERVE BLOCK;  Surgeon: Kerrin Elspeth BROCKS, MD;  Location: [facility] OR;  Service: Thoracic;  Laterality: Left;   NODE DISSECTION Left 05/11/2023   Procedure: NODE DISSECTION;  Surgeon: Kerrin Elspeth BROCKS, MD;  Location[facility] OR;  Service: Thoracic;  Laterality: Left;   PROSTATE SURGERY  03/02/2011   gleason 4+4=8   VIDEO BRONCHOSCOPY WITH RADIAL ENDOBRONCHIAL ULTRASOUND  03/27/2023   Procedure: VIDEO BRONCHOSCOPY WITH RADIAL ENDOBRONCHIAL ULTRASOUND;  Surgeon: Shelah Lamar RAMAN, MD;  Location: [facility] ENDOSCOPY;  Service: Pulmonary;;  :   Current Outpatient Medications:    ACCU-CHEK GUIDE test strip, USE 1  ONCE DAILY, Disp: , Rfl:    amiodarone  (PACERONE ) 200 MG tablet, Take 400 mg two times daily for 2 days;then take 200 mg two times daily for 7 days;then take Amiodarone  200 mg daily thereafter, Disp: 60 tablet, Rfl: 1   Ascorbic Acid (VITAMIN C PO), Take 1 tablet by mouth daily., Disp: , Rfl:    aspirin  EC 81 MG tablet, Take 81 mg by mouth daily., Disp: , Rfl:    atorvastatin  (LIPITOR) 40 MG tablet, Take 40 mg by mouth daily., Disp: , Rfl:    Blood Glucose Monitoring Suppl (ACCU-CHEK GUIDE) w/Device KIT, Use to check BS daily, Disp: , Rfl:    calcium  citrate-vitamin D (CITRACAL+D) 315-200 MG-UNIT per tablet, Take 1 tablet by mouth 2 (two) times daily., Disp: , Rfl:     cetirizine (ZYRTEC) 10 MG tablet, Take 10 mg by mouth daily., Disp: , Rfl:    Cyanocobalamin (B-12 PO), Take 1 capsule by mouth daily., Disp: , Rfl:    gabapentin  (NEURONTIN ) 300 MG capsule, Take 1 capsule by mouth at bedtime, Disp: 30 capsule, Rfl: 0   glipiZIDE  (GLUCOTROL  XL) 10 MG 24 hr tablet, Take 10 mg by mouth daily., Disp: , Rfl:    magnesium  oxide (MAG-OX) 400 MG tablet, Take 800 mg by mouth daily., Disp: , Rfl:    metFORMIN  (GLUCOPHAGE -XR) 500 MG 24 hr tablet, Take 1,000 mg by mouth 2 (two) times daily. , Disp: , Rfl:    metoprolol  succinate (TOPROL -XL) 25 MG 24 hr tablet, Take 1 tablet (25 mg total) by mouth daily., Disp: 30 tablet, Rfl: 1   Microlet Lancets MISC, USE 1  TO CHECK GLUCOSE ONCE DAILY TO  CHECK  GLUCOSE, Disp: , Rfl:    montelukast  (SINGULAIR ) 10 MG tablet, Take 10 mg by mouth at bedtime., Disp: , Rfl:    Multiple Vitamins-Minerals (CENTRUM SILVER 50+MEN) TABS, Take 1 tablet by  mouth daily., Disp: , Rfl:    Multiple Vitamins-Minerals (ZINC PO), Take 1 tablet by mouth daily., Disp: , Rfl:    MYRBETRIQ  50 MG TB24 tablet, Take 50 mg by mouth daily. , Disp: , Rfl:    omeprazole (PRILOSEC) 20 MG capsule, Take 20 mg by mouth daily as needed (indigestion). , Disp: , Rfl:    oxyCODONE  (OXY IR/ROXICODONE ) 5 MG immediate release tablet, Take 1 tablet (5 mg total) by mouth every 6 (six) hours as needed for severe pain (pain score 7-10)., Disp: 30 tablet, Rfl: 0   OZEMPIC, 0.25 OR 0.5 MG/DOSE, 2 MG/3ML SOPN, Inject 0.5 mg as directed every Sunday., Disp: , Rfl:    Pseudoephedrine-APAP-DM (DAYQUIL PO), Take 2 capsules by mouth daily as needed (congestion)., Disp: , Rfl:    SUMAtriptan  (IMITREX ) 100 MG tablet, Take 100 mg by mouth every 2 (two) hours as needed for migraine (migraine). , Disp: , Rfl:    triamcinolone  ointment (KENALOG ) 0.1 %, APPLY  OINTMENT TOPICALLY TWICE DAILY, Disp: , Rfl:    valsartan  (DIOVAN ) 40 MG tablet, Take 1 tablet (40 mg total) by mouth daily., Disp: 30  tablet, Rfl: 1   VITAMIN D PO, Take 1 capsule by mouth daily., Disp: , Rfl: :  No Known Allergies:   Family History  Problem Relation Age of Onset   Stroke Father    Cancer Maternal Grandmother        colon cancer  :   Social History   Socioeconomic History   Marital status: Legally Separated    Spouse name: Not on file   Number of children: 0   Years of education: Not on file   Highest education level: Not on file  Occupational History    Employer: [employer]    Comment: barrister's clerk long distance  Tobacco Use   Smoking status: Former    Current packs/day: 0.00    Average packs/day: 1 pack/day for 5.0 years (5.0 ttl pk-yrs)    Types: Cigarettes    Start date: 01/30/1978    Quit date: 01/31/1983    Years since quitting: 40.3   Smokeless tobacco: Never  Vaping Use   Vaping status: Never Used  Substance and Sexual Activity   Alcohol use: No   Drug use: No   Sexual activity: Never  Other Topics Concern   Not on file  Social History Narrative   Not on file   Social Drivers of Health   Financial Resource Strain: Low Risk  (05/03/2022)   Received from [facility]   Overall Financial Resource Strain (CARDIA)    Difficulty of Paying Living Expenses: Not hard at all  Food Insecurity: No Food Insecurity (05/11/2023)   Hunger Vital Sign    Worried About Running Out of Food in the Last Year: Never true    Ran Out of Food in the Last Year: Never true  Transportation Needs: No Transportation Needs (05/11/2023)   PRAPARE - Administrator, Civil Service (Medical): No    Lack of Transportation (Non-Medical): No  Physical Activity: Insufficiently Active (05/03/2022)   Received [facility]   Exercise Vital Sign    Days of Exercise per Week: 7 days    Minutes of Exercise per Session: 20 min  Stress: No Stress Concern Present (11/15/2022)   Received from [facility] - Occupational Stress Questionnaire     Feeling of Stress : Only a little  Social Connections: Socially  Integrated (05/03/2022)   Received from [facility]   Social Network    How would you rate your social network (family, work, friends)?: Good participation with social networks  Intimate Partner Violence: Not At Risk (05/11/2023)   Humiliation, Afraid, Rape, and Kick questionnaire    Fear of Current or Ex-Partner: No    Emotionally Abused: No    Physically Abused: No    Sexually Abused: No  :  REVIEW OF SYSTEMS:  Patient denies any cough, shortness of breath, chest pain, or hemoptysis or any other changes in his breathing. He endorses some fatigue, but denies any changes in his weight, night sweats, or fevers.   PHYSICAL EXAM:  Blood pressure (!) 148/87, pulse 80, temperature 98.2 F (36.8 C), temperature source Oral, resp. rate 18, height 5' 9 (1.753 m), weight 171 lb (77.6 kg), SpO2 98%. In general this is a well appearing male in no acute distress. He's alert and oriented x4 and appropriate throughout the examination. Cardiopulmonary assessment is negative for acute distress and he exhibits normal effort.      KPS = 100  100 - Normal; no complaints; no evidence of disease. 90   - Able to carry on normal activity; minor signs or symptoms of disease. 80   - Normal activity with effort; some signs or symptoms of disease. 74   - Cares for self; unable to carry on normal activity or to do active work. 60   - Requires occasional assistance, but is able to care for most of his personal needs. 50   - Requires considerable assistance and frequent medical care. 40   - Disabled; requires special care and assistance. 30   - Severely disabled; hospital admission is indicated although death not imminent. 20   - Very sick; hospital admission necessary; active supportive treatment necessary. 10   - Moribund; fatal processes progressing rapidly. 0     - Dead  Karnofsky DA, Abelmann WH, Craver LS and [facility] [phone]) The use of  the nitrogen mustards in the palliative treatment of carcinoma: with particular reference to bronchogenic carcinoma Cancer 1 634-56  LABORATORY DATA:  Lab Results  Component Value Date   WBC 6.0 05/14/2023   HGB 10.2 (L) 05/14/2023   HCT 30.5 (L) 05/14/2023   MCV 87.4 05/14/2023   PLT 152 05/14/2023   Lab Results  Component Value Date   NA 132 (L) 05/16/2023   K 5.0 05/16/2023   CL 98 05/16/2023   CO2 27 05/16/2023   Lab Results  Component Value Date   ALT 26 05/13/2023   AST 28 05/13/2023   ALKPHOS 43 05/13/2023   BILITOT 0.8 05/13/2023     RADIOGRAPHY: DG Chest 2 View Result Date: 05/28/2023 CLINICAL DATA:  Lung nodule. EXAM: CHEST - 2 VIEW COMPARISON:  May 17, 2023. FINDINGS: The heart size and mediastinal contours are within normal limits. Stable left basilar scarring is noted. Elevated left hemidiaphragm is noted. No consolidative process is noted. Old lower thoracic compression fracture is noted. IMPRESSION: Stable left basilar scarring with elevated left hemidiaphragm. Electronically Signed   By: Lynwood Landy Raddle M.D.   On: 05/28/2023 13:22   DG Chest 2 View Result Date: 05/17/2023 CLINICAL DATA:  [id number] Pneumothorax [id number]. Status post LEFT upper lobectomy for metastatic prostate cancer. EXAM: CHEST - 2 VIEW COMPARISON:  May 16, 2023 FINDINGS: The cardiomediastinal silhouette is unchanged in contour. No pleural effusion. Similar postsurgical appearance status post LEFT upper lobectomy. No significant pneumothorax. RIGHT midlung bandlike  opacity most consistent with atelectasis. Visualized abdomen is unremarkable. Multilevel degenerative changes of the thoracic spine. IMPRESSION: Similar postsurgical appearance status post LEFT upper lobectomy. No significant pneumothorax. Electronically Signed   By: Corean Salter M.D.   On: 05/17/2023 08:41   DG Chest 2 View Result Date: 05/16/2023 CLINICAL DATA:  Thoracotomy EXAM: CHEST - 2 VIEW COMPARISON:  05/15/2023  FINDINGS: The heart size and mediastinal contours are within normal limits. Postsurgical changes and volume loss within the left hemithorax. No pneumothorax is seen. Minimal bibasilar atelectasis. Severe T11 compression fracture again seen. IMPRESSION: Postsurgical changes and volume loss within the left hemithorax. No pneumothorax. Electronically Signed   By: Mabel Converse D.O.   On: 05/16/2023 09:20      IMPRESSION/PLAN:  Today, we talked to the patient about the findings and workup thus far. We discussed the natural history of oligometastatic prostate cancer and general treatment, highlighting the role of stereotactic body radiotherapy in the management. We discussed the available radiation techniques, and focused on the details and logistics of delivery.  The recommendation is for a 3 fraction course of SBRT to the RUL lung nodule.  We reviewed the anticipated acute and late sequelae associated with radiation in this setting. The patient was encouraged to ask questions that were answered to his stated satisfaction.  At the conclusion of our conversation, the patient is in agreement to proceed with the recommended 3 fraction course of SBRT to the RUL lung nodule.  He has freely signed written consent to proceed today in the office and a copy of this document will be placed in his medical record.  He is tentatively scheduled for CT simulation at 9 AM on Tuesday, 06/19/2023, in anticipation of beginning his treatments in the near future.  We will share our discussion with Dr. Renda and Dr. Kerrin and proceed with treatment planning accordingly.  We enjoyed meeting with him again today and look forward to continuing to participate in his care.  We personally spent 60 minutes in this encounter including chart review, reviewing radiological studies, meeting face-to-face with the patient, entering orders and completing documentation.     Sabra MICAEL Rusk, PA-C    Donnice Barge, MD  [facility]   Radiation Oncology Direct Dial: [phone]  Fax: [phone] [email or web address]  Skype  LinkedIn

## 2023-06-13 NOTE — Progress Notes (Signed)
 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 Underwent prostatectomy in 2012 followed by 38 sessions of radiation therapy. PMSA PET scan in September 2024 showed no local recurrence but identified lung nodules, consistent with metastatic prostate adenocarcinoma.  PRIOR THERAPY: Post left upper lobectomy with lymph node dissection under the care of Dr. Kerrin on May 11, 2023 and the final pathology showed metastatic prostate adenocarcinoma with lymphovascular invasion and negative resection margin.  CURRENT THERAPY: None  INTERVAL HISTORY: Douglas Zhang 68 y.o. male returns to the clinic today for follow-up visit.Discussed the use of AI scribe software for clinical note transcription with the patient, who gave verbal consent to proceed.  History of Present Illness   The patient, a 68 year old individual with a history of prostate adenocarcinoma diagnosed in 2010, underwent radiation treatment for the same. Recently, two nodules were discovered in the left lung, leading to a debate on whether these were related to the prostate cancer or were indicative of lung cancer. The patient underwent a left upper lobectomy, performed by a thoracic surgeon, Dr. Kerrin to remove these nodules. The pathology report confirmed these nodules as metastases from the prostate adenocarcinoma, not lung cancer.  Post-surgery, the patient reports persistent chest pain but denies significant shortness of breath. He mentions feeling overwhelmed at times while performing household tasks but does not consider it a significant issue. The patient denies any new cough, nausea, vomiting, diarrhea, headaches, or changes in vision. He reports a slight weight loss post-surgery.  In terms of  treatment history, the patient received an unspecified injection from his urologist post-prostate surgery. However, he has not received any recent hormonal therapy for the prostate. The patient is now considered to be in stage four of prostate cancer due to the metastasis to the lung.  The patient also mentions driving for the first time post-surgery, indicating some level of recovery and return to normal activities.       MEDICAL HISTORY: Past Medical History:  Diagnosis Date   AP (acute pancreatitis)    due to gallstones, history   Arthritis    Cataract    no surgery, just forming   COVID-19    Diabetes mellitus without complication (HCC)    ED (erectile dysfunction)    GERD (gastroesophageal reflux disease)    Headache(784.0)    migraines   History of hiatal hernia    Hx of migraines    Hyperlipidemia    Hypertension    Prostate cancer (HCC) 01/09/2011   biopsy-gleason 4+4=8   Radiation 07/03/2012-08/21/2012   prostate 6600 cGy 33 sessions   Stress incontinence, male     ALLERGIES:  has no known allergies.  MEDICATIONS:  Current Outpatient Medications  Medication Sig Dispense Refill   ACCU-CHEK GUIDE test strip USE 1  ONCE DAILY     amiodarone  (PACERONE ) 200 MG tablet Take 400 mg two times daily for 2 days;then take 200 mg two times daily for 7 days;then take Amiodarone  200 mg daily thereafter 60 tablet 1   Ascorbic Acid (VITAMIN C PO) Take 1 tablet by mouth daily.     aspirin  EC 81 MG tablet Take 81 mg by mouth daily.     atorvastatin  (LIPITOR) 40 MG tablet Take 40 mg by mouth daily.     Blood  Glucose Monitoring Suppl (ACCU-CHEK GUIDE) w/Device KIT Use to check BS daily     calcium  citrate-vitamin D (CITRACAL+D) 315-200 MG-UNIT per tablet Take 1 tablet by mouth 2 (two) times daily.     cetirizine (ZYRTEC) 10 MG tablet Take 10 mg by mouth daily.     Cyanocobalamin (B-12 PO) Take 1 capsule by mouth daily.     gabapentin  (NEURONTIN ) 300 MG capsule Take 1 capsule by mouth  at bedtime 30 capsule 0   glipiZIDE  (GLUCOTROL  XL) 10 MG 24 hr tablet Take 10 mg by mouth daily.     magnesium  oxide (MAG-OX) 400 MG tablet Take 800 mg by mouth daily.     metFORMIN  (GLUCOPHAGE -XR) 500 MG 24 hr tablet Take 1,000 mg by mouth 2 (two) times daily.      metoprolol  succinate (TOPROL -XL) 25 MG 24 hr tablet Take 1 tablet (25 mg total) by mouth daily. 30 tablet 1   Microlet Lancets MISC USE 1  TO CHECK GLUCOSE ONCE DAILY TO  CHECK  GLUCOSE     montelukast  (SINGULAIR ) 10 MG tablet Take 10 mg by mouth at bedtime.     Multiple Vitamins-Minerals (CENTRUM SILVER 50+MEN) TABS Take 1 tablet by mouth daily.     Multiple Vitamins-Minerals (ZINC PO) Take 1 tablet by mouth daily.     MYRBETRIQ  50 MG TB24 tablet Take 50 mg by mouth daily.      omeprazole (PRILOSEC) 20 MG capsule Take 20 mg by mouth daily as needed (indigestion).      oxyCODONE  (OXY IR/ROXICODONE ) 5 MG immediate release tablet Take 1 tablet (5 mg total) by mouth every 6 (six) hours as needed for severe pain (pain score 7-10). 30 tablet 0   OZEMPIC, 0.25 OR 0.5 MG/DOSE, 2 MG/3ML SOPN Inject 0.5 mg as directed every Sunday.     Pseudoephedrine-APAP-DM (DAYQUIL PO) Take 2 capsules by mouth daily as needed (congestion).     SUMAtriptan  (IMITREX ) 100 MG tablet Take 100 mg by mouth every 2 (two) hours as needed for migraine (migraine).      triamcinolone  ointment (KENALOG ) 0.1 % APPLY  OINTMENT TOPICALLY TWICE DAILY     valsartan  (DIOVAN ) 40 MG tablet Take 1 tablet (40 mg total) by mouth daily. 30 tablet 1   VITAMIN D PO Take 1 capsule by mouth daily.     No current facility-administered medications for this visit.    SURGICAL HISTORY:  Past Surgical History:  Procedure Laterality Date   BRONCHIAL BIOPSY  03/27/2023   Procedure: BRONCHIAL BIOPSIES;  Surgeon: Shelah Lamar RAMAN, MD;  Location: [facility] ENDOSCOPY;  Service: Pulmonary;;   BRONCHIAL BRUSHINGS  03/27/2023   Procedure: BRONCHIAL BRUSHINGS;  Surgeon: Shelah Lamar RAMAN, MD;   Location: [facility] ENDOSCOPY;  Service: Pulmonary;;   BRONCHIAL NEEDLE ASPIRATION BIOPSY  03/27/2023   Procedure: BRONCHIAL NEEDLE ASPIRATION BIOPSIES;  Surgeon: Shelah Lamar RAMAN, MD;  Location: [facility] ENDOSCOPY;  Service: Pulmonary;;   CATARACT EXTRACTION Bilateral    CHOLECYSTECTOMY  06/05/1988   CHOLECYSTECTOMY, LAPAROSCOPIC     CYSTOSCOPY WITH BIOPSY N/A 06/12/2013   Procedure: CYSTOSCOPY WITH BIOPSY bladder washing;  Surgeon: Noretta Ferrara, MD;  Location: [facility] ORS;  Service: Urology;  Laterality: N/A;  BLADDER BIOPSY CYTOLOGY EXAM UNDER ANESTHESIA     INGUINAL HERNIA REPAIR     bi-lateral   INTERCOSTAL NERVE BLOCK Left 05/11/2023   Procedure: INTERCOSTAL NERVE BLOCK;  Surgeon: Kerrin Elspeth BROCKS, MD;  Location: [facility] OR;  Service: Thoracic;  Laterality: Left;   NODE DISSECTION Left 05/11/2023  Procedure: NODE DISSECTION;  Surgeon: Kerrin Elspeth BROCKS, MD;  Location: [facility] OR;  Service: Thoracic;  Laterality: Left;   PROSTATE SURGERY  03/02/2011   gleason 4+4=8   VIDEO BRONCHOSCOPY WITH RADIAL ENDOBRONCHIAL ULTRASOUND  03/27/2023   Procedure: VIDEO BRONCHOSCOPY WITH RADIAL ENDOBRONCHIAL ULTRASOUND;  Surgeon: Shelah Lamar RAMAN, MD;  Location: [facility] ENDOSCOPY;  Service: Pulmonary;;    REVIEW OF SYSTEMS:  Constitutional: positive for fatigue Eyes: negative Ears, nose, mouth, throat, and face: negative Respiratory: positive for pleurisy/chest pain Cardiovascular: negative Gastrointestinal: negative Genitourinary:negative Integument/breast: negative Hematologic/lymphatic: negative Musculoskeletal:negative Neurological: negative Behavioral/Psych: negative Endocrine: negative Allergic/Immunologic: negative   PHYSICAL EXAMINATION: General appearance: alert, cooperative, fatigued, and no distress Head: Normocephalic, without obvious abnormality, atraumatic Neck: no adenopathy, no JVD, supple, symmetrical, trachea midline, and thyroid not enlarged, symmetric, no tenderness/mass/nodules Lymph nodes:  Cervical, supraclavicular, and axillary nodes normal. Resp: clear to auscultation bilaterally Back: symmetric, no curvature. ROM normal. No CVA tenderness. Cardio: regular rate and rhythm, S1, S2 normal, no murmur, click, rub or gallop GI: soft, non-tender; bowel sounds normal; no masses,  no organomegaly Extremities: extremities normal, atraumatic, no cyanosis or edema Neurologic: Alert and oriented X 3, normal strength and tone. Normal symmetric reflexes. Normal coordination and gait  ECOG PERFORMANCE STATUS: 1 - Symptomatic but completely ambulatory  Blood pressure 138/81, pulse 70, temperature 97.9 F (36.6 C), temperature source Temporal, resp. rate 16, height 5' 9 (1.753 m), weight 169 lb 6.4 oz (76.8 kg), SpO2 98%.  LABORATORY DATA: Lab Results  Component Value Date   WBC 6.0 05/14/2023   HGB 10.2 (L) 05/14/2023   HCT 30.5 (L) 05/14/2023   MCV 87.4 05/14/2023   PLT 152 05/14/2023      Chemistry      Component Value Date/Time   NA 132 (L) 05/16/2023 0909   K 5.0 05/16/2023 0909   CL 98 05/16/2023 0909   CO2 27 05/16/2023 0909   BUN 28 (H) 05/16/2023 0909   CREATININE 1.32 (H) 05/16/2023 0909   CREATININE 1.56 (H) 04/16/2023 1334      Component Value Date/Time   CALCIUM  9.2 05/16/2023 0909   ALKPHOS 43 05/13/2023 0202   AST 28 05/13/2023 0202   AST 28 04/16/2023 1334   ALT 26 05/13/2023 0202   ALT 31 04/16/2023 1334   BILITOT 0.8 05/13/2023 0202   BILITOT 0.8 04/16/2023 1334       RADIOGRAPHIC STUDIES: DG Chest 2 View Result Date: 05/28/2023 CLINICAL DATA:  Lung nodule. EXAM: CHEST - 2 VIEW COMPARISON:  May 17, 2023. FINDINGS: The heart size and mediastinal contours are within normal limits. Stable left basilar scarring is noted. Elevated left hemidiaphragm is noted. No consolidative process is noted. Old lower thoracic compression fracture is noted. IMPRESSION: Stable left basilar scarring with elevated left hemidiaphragm. Electronically Signed   By:  Lynwood Landy Raddle M.D.   On: 05/28/2023 13:22   DG Chest 2 View Result Date: 05/17/2023 CLINICAL DATA:  [id number] Pneumothorax [id number]. Status post LEFT upper lobectomy for metastatic prostate cancer. EXAM: CHEST - 2 VIEW COMPARISON:  May 16, 2023 FINDINGS: The cardiomediastinal silhouette is unchanged in contour. No pleural effusion. Similar postsurgical appearance status post LEFT upper lobectomy. No significant pneumothorax. RIGHT midlung bandlike opacity most consistent with atelectasis. Visualized abdomen is unremarkable. Multilevel degenerative changes of the thoracic spine. IMPRESSION: Similar postsurgical appearance status post LEFT upper lobectomy. No significant pneumothorax. Electronically Signed   By: Corean Salter M.D.   On: 05/17/2023 08:41   DG Chest 2 View  Result Date: 05/16/2023 CLINICAL DATA:  Thoracotomy EXAM: CHEST - 2 VIEW COMPARISON:  05/15/2023 FINDINGS: The heart size and mediastinal contours are within normal limits. Postsurgical changes and volume loss within the left hemithorax. No pneumothorax is seen. Minimal bibasilar atelectasis. Severe T11 compression fracture again seen. IMPRESSION: Postsurgical changes and volume loss within the left hemithorax. No pneumothorax. Electronically Signed   By: Mabel Converse D.O.   On: 05/16/2023 09:20   DG CHEST PORT 1 VIEW Result Date: 05/15/2023 CLINICAL DATA:  Pneumothorax. EXAM: PORTABLE CHEST 1 VIEW COMPARISON:  Radiographs 05/14/2023 and 05/13/2023.  CT 03/27/2023. FINDINGS: 1224 hours. Interval left chest tube removal. No pneumothorax is seen. There is stable volume loss in the left hemithorax from recent lobectomy. There is mild subsegmental atelectasis at both lung bases. No significant pleural effusion. The heart size and mediastinal contours are stable. No acute osseous findings are seen. IMPRESSION: 1. Interval left chest tube removal. No pneumothorax identified. 2. Stable volume loss in the left hemithorax from recent  lobectomy. Electronically Signed   By: Elsie Perone M.D.   On: 05/15/2023 16:46    ASSESSMENT AND PLAN: This is a very pleasant 68 years old white male with Metastatic adenocarcinoma of the prostate, diagnosed in 2010 with a Gleason score of 8 (4+4). Underwent prostatectomy in 2012 followed by 38 sessions of radiation therapy. PMSA PET scan in September 2024 showed no local recurrence but identified lung nodules, consistent with metastatic prostate adenocarcinoma. The patient is status post left upper lobectomy with lymph node dissection under the care of Dr. Kerrin on May 11, 2023 and the final pathology showed metastatic prostate adenocarcinoma with lymphovascular invasion and negative resection margin. I had a lengthy discussion with the patient today about his current condition and treatment options.  Prostate Adenocarcinoma with Metastasis to Lung Diagnosed with prostate adenocarcinoma in 2010, treated with radiation therapy. Recently, two nodules in the left lung were confirmed as metastatic prostate adenocarcinoma. Underwent left upper lobectomy on May 11, 2023. Currently experiencing post-surgical chest pain but no other significant symptoms. Slight weight loss post-surgery. No recent hormonal therapy or other treatments for prostate cancer. Discussed referral to a Prostate medical oncologist for further management, including potential hormonal therapy or medications to prevent further metastasis. - Refer to medical oncologist specializing in prostate cancer (Dr. Tina) - Arrange appointment with Dr. Tina - Ensure Dr. Hall office contacts patient or his sister  Post-Surgical Chest Pain Persistent chest pain following left upper lobectomy on May 11, 2023. Pain is present but patient is able to drive and perform daily activities. - Discuss pain management options.   The patient was advised to call immediately if he has any other concerning symptoms in the interval. The  patient voices understanding of current disease status and treatment options and is in agreement with the current care plan.  All questions were answered. The patient knows to call the clinic with any problems, questions or concerns. We can certainly see the patient much sooner if necessary. The total time spent in the appointment was 30 minutes.  Disclaimer: This note was dictated with voice recognition software. Similar sounding words can inadvertently be transcribed and may not be corrected upon review.

## 2023-06-14 ENCOUNTER — Ambulatory Visit
Admission: RE | Admit: 2023-06-14 | Discharge: 2023-06-14 | Disposition: A | Discharge status: Home / Self Care | Payer: Medicare HMO | Source: Ambulatory Visit | Attending: Urology | Admitting: Urology

## 2023-06-14 ENCOUNTER — Encounter: Payer: Self-pay | Admitting: Urology

## 2023-06-14 ENCOUNTER — Telehealth: Payer: Self-pay | Admitting: Radiation Oncology

## 2023-06-14 VITALS — BP 148/87 | HR 80 | Temp 98.2°F | Resp 18 | Ht 69.0 in | Wt 171.0 lb

## 2023-06-14 DIAGNOSIS — J984 Other disorders of lung: Secondary | ICD-10-CM | POA: Diagnosis not present

## 2023-06-14 DIAGNOSIS — N393 Stress incontinence (female) (male): Secondary | ICD-10-CM | POA: Insufficient documentation

## 2023-06-14 DIAGNOSIS — E785 Hyperlipidemia, unspecified: Secondary | ICD-10-CM | POA: Insufficient documentation

## 2023-06-14 DIAGNOSIS — E119 Type 2 diabetes mellitus without complications: Secondary | ICD-10-CM | POA: Diagnosis not present

## 2023-06-14 DIAGNOSIS — C61 Malignant neoplasm of prostate: Secondary | ICD-10-CM | POA: Diagnosis not present

## 2023-06-14 DIAGNOSIS — R9721 Rising PSA following treatment for malignant neoplasm of prostate: Secondary | ICD-10-CM | POA: Insufficient documentation

## 2023-06-14 DIAGNOSIS — I10 Essential (primary) hypertension: Secondary | ICD-10-CM | POA: Diagnosis not present

## 2023-06-14 DIAGNOSIS — Z923 Personal history of irradiation: Secondary | ICD-10-CM | POA: Diagnosis not present

## 2023-06-14 DIAGNOSIS — Z7985 Long-term (current) use of injectable non-insulin antidiabetic drugs: Secondary | ICD-10-CM | POA: Insufficient documentation

## 2023-06-14 DIAGNOSIS — Z79899 Other long term (current) drug therapy: Secondary | ICD-10-CM | POA: Diagnosis not present

## 2023-06-14 DIAGNOSIS — Z7982 Long term (current) use of aspirin: Secondary | ICD-10-CM | POA: Insufficient documentation

## 2023-06-14 DIAGNOSIS — Z87891 Personal history of nicotine dependence: Secondary | ICD-10-CM | POA: Insufficient documentation

## 2023-06-14 DIAGNOSIS — C7801 Secondary malignant neoplasm of right lung: Secondary | ICD-10-CM | POA: Diagnosis present

## 2023-06-14 DIAGNOSIS — C3412 Malignant neoplasm of upper lobe, left bronchus or lung: Secondary | ICD-10-CM

## 2023-06-14 DIAGNOSIS — Z800 Family history of malignant neoplasm of digestive organs: Secondary | ICD-10-CM | POA: Diagnosis not present

## 2023-06-14 DIAGNOSIS — C7802 Secondary malignant neoplasm of left lung: Secondary | ICD-10-CM | POA: Diagnosis not present

## 2023-06-14 DIAGNOSIS — Z7984 Long term (current) use of oral hypoglycemic drugs: Secondary | ICD-10-CM | POA: Diagnosis not present

## 2023-06-14 DIAGNOSIS — K449 Diaphragmatic hernia without obstruction or gangrene: Secondary | ICD-10-CM | POA: Insufficient documentation

## 2023-06-14 DIAGNOSIS — K219 Gastro-esophageal reflux disease without esophagitis: Secondary | ICD-10-CM | POA: Insufficient documentation

## 2023-06-14 DIAGNOSIS — N529 Male erectile dysfunction, unspecified: Secondary | ICD-10-CM | POA: Diagnosis not present

## 2023-06-14 NOTE — Progress Notes (Signed)
 Nursing interview for oligometastatic prostate cancer involving the lungs; s/p prostatectomy in 2012 and salvage prostate fossa radiation in 2014.    Patient identity verified x2.  Patient reports mild fatigue but is doing well. Patient denies any related issues at this time.  Meaningful use complete.  Vitals- BP (!) 148/87 (BP Location: Left Arm, Patient Position: Sitting, Cuff Size: Normal)   Pulse 80   Temp 98.2 F (36.8 C) (Oral)   Resp 18   Ht 5' 9 (1.753 m)   Wt 171 lb (77.6 kg)   SpO2 98%   BMI 25.25 kg/m    This concludes the interaction.  Rosaline Minerva, LPN

## 2023-06-14 NOTE — Telephone Encounter (Signed)
 1/9 @ 8:15 am Left voicemail for patient to call our office to confirm he will be here at 2:00 pm for his follow up consult or be reschedule for following Monday, Jan 13 -per Ashlyn.  Waiting on call back to confirm.

## 2023-06-15 ENCOUNTER — Other Ambulatory Visit: Payer: Self-pay | Admitting: Physician Assistant

## 2023-06-18 DIAGNOSIS — Z9189 Other specified personal risk factors, not elsewhere classified: Secondary | ICD-10-CM | POA: Insufficient documentation

## 2023-06-18 NOTE — Assessment & Plan Note (Addendum)
 CBC, CMP, PSA, testosterone in about 3 months. He has PSA last week at [facility] baseline PSMA PET in 3 months If still oligometastasis, consider SBRT if feasible If significant disease burden, will consider ADT/ARPI Referral for genetic test

## 2023-06-18 NOTE — Progress Notes (Signed)
  Cancer Center CONSULT NOTE  Patient Care Team: Mirna Anes, Douglas Zhang as PCP - General (Family Medicine)  ASSESSMENT & PLAN:  Douglas Zhang is a 68 y.o.male with history of prostate cancer status post prostatectomy, salvage radiation in 2014, diabetes, hypertension, hyperlipidemia, GERD, arthritis [facility] prostate cancer. He is currently with hormone sensitive metastatic disease.  Current diagnosis: mHSPC Initial diagnosis: [id number] Gleason score of 8 (4+4) +ECE, + Margin involved L lobe from prostatectomy (2012) followed by salvage radiation in 2014 Germline testing: not yet Somatic testing: not yet Treatment: To be determined  The patient was counseled on the natural history of prostate cancer and the standard treatment options that are available for prostate cancer.  He reported having PSA testing last week.  He will see Dr. Renda next week.  Will coordinate with Dr. Renda on his future visit.  We discussed the difference between high versus low burden disease, PSADT.  We discussed he may or may not need to start systemic treatment based on his progression over the next few months.  Systemic treatment can be considered if significant progression in the next PSMA PET scan.  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 He has PSA last week at [facility] baseline PSMA PET in 3 months If still oligometastasis, consider SBRT if feasible If significant disease burden, will consider ADT/ARPI Referral for genetic test  At risk for side effect of medication Supportive baseline bone mineral density study  calcium  (1000-1200 mg daily from food and supplements) and vitamin D3 (1000 IU daily) Routine dental care.  Control and prevent diabetes Aggressive cardiovascular risk management Weight-bearing exercises (30 minutes per day) Limit alcohol consumption and avoid smoking  HTN  (hypertension) On metoprolol  and valsartan .  HLD (hyperlipidemia) On Lipitor 40 mg daily   Orders Placed This Encounter  Procedures   NM PET (PSMA) SKULL TO MID THIGH    Standing Status:   Future    Expected Date:   09/03/2023    Expiration Date:   06/18/2024    If indicated for the ordered procedure, I authorize the administration of a radiopharmaceutical per Radiology protocol:   Yes    Preferred imaging location?:   [facility]   CBC with Differential (Cancer Center Only)    Standing Status:   Future    Expiration Date:   06/18/2024   CMP (Cancer Center only)    Standing Status:   Future    Expiration Date:   06/18/2024   Lactate dehydrogenase    Standing Status:   Future    Expiration Date:   06/18/2024   PSA, total and free    Standing Status:   Future    Expiration Date:   06/18/2024   Testosterone     Standing Status:   Future    Expiration Date:   06/18/2024   Ambulatory referral to Genetics    Referral Priority:   Routine    Referral Type:   Consultation    Referral Reason:   Specialty Services Required    Number of Visits Requested:   1    The total time spent in the appointment was 50 minutes encounter with patients including review of chart and various tests results, discussions about plan of care and coordination of care plan  All questions were answered. The patient knows to call the clinic with any problems, questions or concerns. No barriers to learning was detected.  Douglas JAYSON Chihuahua, Douglas Zhang 1/14/202511:54 AM  CHIEF COMPLAINTS/PURPOSE OF CONSULTATION:  Prostate cancer  HISTORY OF PRESENTING ILLNESS:  Douglas Zhang 68 y.o. male is here because of prostate cancer. I have reviewed his chart and materials related to his cancer extensively and collaborated history with the patient. Summary of oncologic history is as follows:  He is otherwise feeling well. No decreased appetite, unexpected weight loss, new back pain, bone pain or stomach pain. No difficulty  urinating.  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 Three radiotracer avid pulmonary nodules most consistent with prostate cancer pulmonary metastasis. 2. No evidence of local prostate carcinoma recurrence in the prostate fossa. 3. No evidence of metastatic adenopathy in the pelvis or periaortic retroperitoneum. 4. No evidence of skeletal metastasis.   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 No evidence of metastatic disease.    05/11/2023 Surgery   FINAL MICROSCOPIC DIAGNOSIS:   A. LYMPH NODE, LEVEL 9, EXCISION:       One lymph node, negative for metastatic carcinoma (0/1).   B. LYMPH NODE, LEVEL 8, EXCISION:       One lymph node, negative for metastatic carcinoma (0/1).   C. LYMPH NODE, LEVEL 9 #2, EXCISION:       One lymph node, negative for metastatic carcinoma (0/1).   D. LYMPH NODE, LEVEL 9 #3, EXCISION:       One lymph node, negative for metastatic carcinoma (0/1).   E. LYMPH NODE, LEVEL 7, EXCISION:       One lymph node, negative for metastatic carcinoma (0/1).   F. LYMPH NODE, LEVEL 10, EXCISION:       One lymph node, negative for metastatic carcinoma (0/1).   G. LYMPH NODE, LEVEL 5, EXCISION:       One lymph node, negative for metastatic carcinoma (0/1).   H. LYMPH NODE, LEVEL 11, EXCISION:       One lymph node, negative for metastatic carcinoma (0/1).   I. LYMPH NODE, LEVEL 10 #2, EXCISION:       One lymph node, negative for metastatic carcinoma (0/1).   J. LYMPH NODE, LEVEL 12, EXCISION:       One lymph node, negative for metastatic carcinoma (0/1).   K. LYMPH NODE, LEVEL 11 #2, EXCISION:       One lymph node, negative for metastatic  carcinoma (0/1).   L. LUNG, LEFT UPPER LOBE, LOBECTOMY:       Metastatic prostatic adenocarcinoma.       Lymphovascular invasion identified.       Surgical margins of resection are negative for carcinoma.       Seven lymph nodes, negative for metastatic carcinoma (0/7).       See comment.   COMMENT:   The lobectomy specimen demonstrates a complex glandular proliferation  forming cribriform architecture with multiple satellite foci.  Immunohistochemical stains show the cells are strongly and diffusely  positive for NKX3.1 and are negative for TTF-1. The patients remote  history of prostatic carcinoma is noted. The findings are consistent  with metastasis from the patients known prostatic adenocarcinoma with  Gleason score 4+4=8. Lymphovascular invasion is seen. Surgical margins  are negative for carcinoma.  Controls worked appropriately.      MEDICAL HISTORY:  Past Medical History:  Diagnosis Date   AP (acute pancreatitis)    due to gallstones, history   Arthritis    Cataract    no surgery, just forming   COVID-19    Diabetes mellitus without complication (HCC)    ED (erectile dysfunction)    GERD (gastroesophageal reflux disease)    Headache(784.0)    migraines   History of hiatal hernia    Hx of migraines    Hyperlipidemia    Hypertension    Prostate cancer (HCC) 01/09/2011   biopsy-gleason 4+4=8   Radiation 07/03/2012-08/21/2012   prostate 6600 cGy 33 sessions   Stress incontinence, male     SURGICAL HISTORY: Past Surgical History:  Procedure Laterality Date   BRONCHIAL BIOPSY  03/27/2023   Procedure: BRONCHIAL BIOPSIES;  Surgeon: Shelah Lamar RAMAN, Douglas Zhang;  Location: [facility];  Service: Pulmonary;;   BRONCHIAL BRUSHINGS  03/27/2023   Procedure: BRONCHIAL BRUSHINGS;  Surgeon: Shelah Lamar RAMAN, Douglas Zhang;  Location: [facility];  Service: Pulmonary;;   BRONCHIAL NEEDLE ASPIRATION BIOPSY  03/27/2023   Procedure: BRONCHIAL NEEDLE ASPIRATION BIOPSIES;  Surgeon: Shelah Lamar RAMAN, Douglas Zhang;  Location: [facility];  Service: Pulmonary;;   CATARACT EXTRACTION Bilateral    CHOLECYSTECTOMY  06/05/1988   CHOLECYSTECTOMY, LAPAROSCOPIC     CYSTOSCOPY WITH BIOPSY N/A 06/12/2013   Procedure: CYSTOSCOPY WITH BIOPSY bladder washing;  Surgeon: Noretta Ferrara, Douglas Zhang;  Location: [facility];  Service: Urology;  Laterality: N/A;  BLADDER BIOPSY CYTOLOGY EXAM UNDER ANESTHESIA     INGUINAL HERNIA REPAIR     bi-lateral   INTERCOSTAL NERVE BLOCK Left 05/11/2023   Procedure: INTERCOSTAL NERVE BLOCK;  Surgeon: Kerrin Elspeth BROCKS, Douglas Zhang;  Location: [facility] OR;  Service: Thoracic;  Laterality: Left;   NODE DISSECTION Left 05/11/2023   Procedure: NODE DISSECTION;  Surgeon: Kerrin Elspeth BROCKS, Douglas Zhang;  Location: [facility] OR;  Service: Thoracic;  Laterality: Left;   PROSTATE SURGERY  03/02/2011   gleason 4+4=8   VIDEO BRONCHOSCOPY WITH RADIAL ENDOBRONCHIAL ULTRASOUND  03/27/2023   Procedure: VIDEO BRONCHOSCOPY WITH RADIAL ENDOBRONCHIAL ULTRASOUND;  Surgeon: Shelah Lamar RAMAN, Douglas Zhang;  Location: [facility];  Service: Pulmonary;;    SOCIAL HISTORY: Social History   Socioeconomic History   Marital status: Legally Separated    Spouse name: Not on file   Number of children: 0   Years of education: Not on file   Highest education level: Not on file  Occupational History    Employer: [employer]    Comment: tractor trailer  driver long distance  Tobacco Use   Smoking status: Former    Current packs/day: 0.00    Average packs/day: 1 pack/day for 5.0 years (5.0 ttl pk-yrs)    Types: Cigarettes    Start date: 01/30/1978    Quit date: 01/31/1983    Years since quitting: 40.4   Smokeless tobacco: Never  Vaping Use   Vaping status: Never Used  Substance and Sexual Activity   Alcohol use: No   Drug use: No   Sexual activity: Never  Other Topics Concern   Not on file  Social History Narrative   Not on file   Social Drivers of Health   Financial Resource Strain: Low Risk  (05/03/2022)   Received from [facility]   Overall Financial Resource Strain (CARDIA)    Difficulty of Paying Living Expenses: Not hard at all  Food Insecurity: No Food Insecurity (05/11/2023)   Hunger Vital Sign  Worried About Programme Researcher, Broadcasting/film/video in the Last Year: Never true    Ran Out of Food in the Last Year: Never true  Transportation Needs: No Transportation Needs (05/11/2023)   PRAPARE - Administrator, Civil Service (Medical): No    Lack of Transportation (Non-Medical): No  Physical Activity: Insufficiently Active (05/03/2022)   [facility] from [facility]   Exercise Vital Sign    Days of Exercise per Week: 7 days    Minutes of Exercise per Session: 20 min  Stress: No Stress Concern Present (11/15/2022)   Received from [facility] - Occupational Stress Questionnaire    Feeling of Stress : Only a little  Social Connections: Socially Integrated (05/03/2022)   Received from [facility]   Social Network    How would you rate your social network (family, work, friends)?: Good participation with social networks  Intimate Partner Violence: Not At Risk (05/11/2023)   Humiliation, Afraid, Rape, and Kick questionnaire    Fear of Current or Ex-Partner: No    Emotionally Abused: No    Physically Abused: No    Sexually Abused: No    FAMILY HISTORY: Family History  Problem Relation Age of Onset   Stroke Father    Cancer Maternal Grandmother        colon cancer    ALLERGIES:  has no known allergies.  MEDICATIONS:  Current Outpatient Medications  Medication Sig Dispense Refill   ACCU-CHEK GUIDE test strip USE 1  ONCE DAILY     amiodarone  (PACERONE ) 200 MG tablet Take 400 mg two times daily for 2 days;then take 200 mg two times daily for 7 days;then take Amiodarone  200 mg daily thereafter 60 tablet 1   Ascorbic Acid (VITAMIN C PO) Take 1 tablet by mouth daily.     aspirin  EC 81 MG tablet Take 81 mg by mouth daily.     atorvastatin  (LIPITOR) 40 MG tablet  Take 40 mg by mouth daily.     Blood Glucose Monitoring Suppl (ACCU-CHEK GUIDE) w/Device KIT Use to check BS daily     calcium  citrate-vitamin D (CITRACAL+D) 315-200 MG-UNIT per tablet Take 1 tablet by mouth 2 (two) times daily.     cetirizine (ZYRTEC) 10 MG tablet Take 10 mg by mouth daily.     Cyanocobalamin (B-12 PO) Take 1 capsule by mouth daily.     gabapentin  (NEURONTIN ) 300 MG capsule Take 1 capsule by mouth at bedtime 30 capsule 0   glipiZIDE  (GLUCOTROL  XL) 10 MG 24 hr tablet Take 10 mg by mouth daily.     magnesium  oxide (MAG-OX) 400 MG tablet Take 800 mg by mouth daily.     metFORMIN  (GLUCOPHAGE -XR) 500 MG 24 hr tablet Take 1,000 mg by mouth 2 (two) times daily.      metoprolol  succinate (TOPROL -XL) 25 MG 24 hr tablet Take 1 tablet (25 mg total) by mouth daily. 30 tablet 1   Microlet Lancets MISC USE 1  TO CHECK GLUCOSE ONCE DAILY TO  CHECK  GLUCOSE     montelukast  (SINGULAIR ) 10 MG tablet Take 10 mg by mouth at bedtime.     Multiple Vitamins-Minerals (CENTRUM SILVER 50+MEN) TABS Take 1 tablet by mouth daily.     Multiple Vitamins-Minerals (ZINC PO) Take 1 tablet by mouth daily.     MYRBETRIQ  50 MG TB24 tablet Take 50 mg by mouth daily.      omeprazole (PRILOSEC) 20 MG capsule Take 20 mg by mouth  daily as needed (indigestion).      oxyCODONE  (OXY IR/ROXICODONE ) 5 MG immediate release tablet Take 1 tablet (5 mg total) by mouth every 6 (six) hours as needed for severe pain (pain score 7-10). 30 tablet 0   OZEMPIC, 0.25 OR 0.5 MG/DOSE, 2 MG/3ML SOPN Inject 0.5 mg as directed every Sunday.     Pseudoephedrine-APAP-DM (DAYQUIL PO) Take 2 capsules by mouth daily as needed (congestion).     SUMAtriptan  (IMITREX ) 100 MG tablet Take 100 mg by mouth every 2 (two) hours as needed for migraine (migraine).      triamcinolone  ointment (KENALOG ) 0.1 % APPLY  OINTMENT TOPICALLY TWICE DAILY     valsartan  (DIOVAN ) 40 MG tablet Take 1 tablet (40 mg total) by mouth daily. 30 tablet 1   VITAMIN D PO Take  1 capsule by mouth daily.     No current facility-administered medications for this visit.    REVIEW OF SYSTEMS:   All relevant systems were reviewed with the patient and are negative.  PHYSICAL EXAMINATION: ECOG PERFORMANCE STATUS: 0 - Asymptomatic  Vitals:   06/19/23 1025  BP: (!) 144/80  Pulse: 75  Resp: 19  Temp: 97.9 F (36.6 C)  SpO2: 99%   Filed Weights   06/19/23 1025  Weight: 172 lb 14.4 oz (78.4 kg)    GENERAL: alert, no distress and comfortable SKIN: skin color is normal, no jaundice, rashes EYES: sclera clear OROPHARYNX: no exudate, no erythema NECK: supple LYMPH:  no palpable lymphadenopathy in the cervical, axillary regions LUNGS: Effort normal, no respiratory distress.  Clear to auscultation bilaterally HEART: regular rate & rhythm and no lower extremity edema ABDOMEN: soft, non-tender and nondistended Musculoskeletal: no point tenderness NEURO: no focal motor/sensory deficits  LABORATORY DATA:  I have reviewed the data as listed Lab Results  Component Value Date   WBC 6.0 05/14/2023   HGB 10.2 (L) 05/14/2023   HCT 30.5 (L) 05/14/2023   MCV 87.4 05/14/2023   PLT 152 05/14/2023   Recent Labs    04/16/23 1334 05/09/23 1440 05/12/23 0158 05/13/23 0202 05/14/23 0157 05/15/23 0730 05/16/23 0909  NA 140 138   < > 134* 135 134* 132*  K 4.2 4.5   < > 4.2 4.9 5.2* 5.0  CL 106 104   < > 104 104 100 98  CO2 28 26   < > 23 25 25 27   GLUCOSE 129* 111*   < > 150* 153* 182* 204*  BUN 36* 29*   < > 41* 32* 27* 28*  CREATININE 1.56* 1.30*   < > 1.59* 1.36* 1.28* 1.32*  CALCIUM  10.2 10.0   < > 8.3* 8.6* 9.2 9.2  GFRNONAA 48* 60*   < > 47* 57* >60 59*  PROT 6.6 7.0  --  5.2*  --   --   --   ALBUMIN 4.2 4.1  --  2.9*  --   --   --   AST 28 35  --  28  --   --   --   ALT 31 45*  --  26  --   --   --   ALKPHOS 65 61  --  43  --   --   --   BILITOT 0.8 0.8  --  0.8  --   --   --    < > = values in this interval not displayed.    RADIOGRAPHIC  STUDIES: I have personally reviewed the radiological images as listed and agreed  with the findings in the report. DG Chest 2 View Result Date: 05/28/2023 CLINICAL DATA:  Lung nodule. EXAM: CHEST - 2 VIEW COMPARISON:  May 17, 2023. FINDINGS: The heart size and mediastinal contours are within normal limits. Stable left basilar scarring is noted. Elevated left hemidiaphragm is noted. No consolidative process is noted. Old lower thoracic compression fracture is noted. IMPRESSION: Stable left basilar scarring with elevated left hemidiaphragm. Electronically Signed   By: Lynwood Landy Raddle M.D.   On: 05/28/2023 13:22

## 2023-06-18 NOTE — Assessment & Plan Note (Signed)
 Supportive baseline bone mineral density study  calcium  (1000-1200 mg daily from food and supplements) and vitamin D3 (1000 IU daily) Routine dental care.  Control and prevent diabetes Aggressive cardiovascular risk management Weight-bearing exercises (30 minutes per day) Limit alcohol consumption and avoid smoking

## 2023-06-18 NOTE — Progress Notes (Signed)
  Radiation Oncology         [phone] ________________________________  Name: Douglas Zhang MRN: [id number]  Date: 06/19/2023  DOB: 1955/10/28  STEREOTACTIC BODY RADIOTHERAPY SIMULATION AND TREATMENT PLANNING NOTE    ICD-10-CM   1. 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   NARRATIVE:  The patient was brought to the CT Simulation planning suite.  Identity was confirmed.  All relevant records and images related to the planned course of therapy were reviewed.  The patient freely provided informed written consent to proceed with treatment after reviewing the details related to the planned course of therapy. The consent form was witnessed and verified by the simulation staff.  Then, the patient was set-up in a stable reproducible  supine position for radiation therapy.  A BodyFix immobilization pillow was fabricated for reproducible positioning.  Then I personally applied the abdominal compression paddle to limit respiratory excursion.  4D respiratoy motion management CT images were obtained.  Surface markings were placed.  The CT images were loaded into the planning software.  Then, using Cine, MIP, and standard views, the internal target volume (ITV) and planning target volumes (PTV) were delinieated, and avoidance structures were contoured.  Treatment planning then occurred.  The radiation prescription was entered and confirmed.  A total of two complex treatment devices were fabricated in the form of the BodyFix immobilization pillow and a neck accuform cushion.  I have requested : 3D Simulation  I have requested a DVH of the following structures: Heart, Lungs, Esophagus, Chest Wall, Brachial Plexus, Major Blood Vessels, and targets.  SPECIAL TREATMENT PROCEDURE:  The planned course of therapy using  radiation constitutes a special treatment procedure. Special care is required in the management of this patient for the following reasons. This treatment constitutes a Special Treatment Procedure for the following reason: [ High dose per fraction requiring special monitoring for increased toxicities of treatment including daily imaging..  The special nature of the planned course of radiotherapy will require increased physician supervision and oversight to ensure patient's safety with optimal treatment outcomes.  This requires extended time and effort.    RESPIRATORY MOTION MANAGEMENT SIMULATION:  In order to account for effect of respiratory motion on target structures and other organs in the planning and delivery of radiotherapy, this patient underwent respiratory motion management simulation.  To accomplish this, when the patient was brought to the CT simulation planning suite, 4D respiratoy motion management CT images were obtained.  The CT images were loaded into the planning software.  Then, using a variety of tools including Cine, MIP, and standard views, the target volume and planning target volumes (PTV) were delineated.  Avoidance structures were contoured.  Treatment planning then occurred.  Dose volume histograms were generated and reviewed for each of the requested structure.  The resulting plan was carefully reviewed and approved today.  PLAN:  The patient will receive 54 Gy in 3 fraction.  ________________________________  Donnice FELIX Patrcia, M.D.

## 2023-06-19 ENCOUNTER — Inpatient Hospital Stay: Payer: Medicare HMO

## 2023-06-19 ENCOUNTER — Telehealth: Payer: Self-pay

## 2023-06-19 ENCOUNTER — Ambulatory Visit
Admission: RE | Admit: 2023-06-19 | Discharge: 2023-06-19 | Disposition: A | Discharge status: Home / Self Care | Payer: Medicare HMO | Source: Ambulatory Visit | Attending: Radiation Oncology | Admitting: Radiation Oncology

## 2023-06-19 VITALS — BP 144/80 | HR 75 | Temp 97.9°F | Resp 19 | Wt 172.9 lb

## 2023-06-19 DIAGNOSIS — C61 Malignant neoplasm of prostate: Secondary | ICD-10-CM

## 2023-06-19 DIAGNOSIS — C7800 Secondary malignant neoplasm of unspecified lung: Secondary | ICD-10-CM

## 2023-06-19 DIAGNOSIS — I10 Essential (primary) hypertension: Secondary | ICD-10-CM | POA: Insufficient documentation

## 2023-06-19 DIAGNOSIS — Z510 Encounter for antineoplastic radiation therapy: Secondary | ICD-10-CM | POA: Diagnosis present

## 2023-06-19 DIAGNOSIS — E785 Hyperlipidemia, unspecified: Secondary | ICD-10-CM | POA: Insufficient documentation

## 2023-06-19 DIAGNOSIS — Z9189 Other specified personal risk factors, not elsewhere classified: Secondary | ICD-10-CM

## 2023-06-19 DIAGNOSIS — E7849 Other hyperlipidemia: Secondary | ICD-10-CM | POA: Diagnosis not present

## 2023-06-19 DIAGNOSIS — I159 Secondary hypertension, unspecified: Secondary | ICD-10-CM | POA: Diagnosis not present

## 2023-06-19 NOTE — Assessment & Plan Note (Signed)
On Lipitor 40 mg daily. 

## 2023-06-19 NOTE — Assessment & Plan Note (Signed)
 On metoprolol and valsartan.

## 2023-06-20 ENCOUNTER — Telehealth: Payer: Self-pay | Admitting: Genetic Counselor

## 2023-06-20 NOTE — Telephone Encounter (Signed)
 Scheduled appointments per scheduling message. Patient is aware of the made appointments and will be mailed an appointment reminder.

## 2023-06-26 DIAGNOSIS — C61 Malignant neoplasm of prostate: Secondary | ICD-10-CM | POA: Diagnosis not present

## 2023-07-02 ENCOUNTER — Other Ambulatory Visit: Payer: Self-pay | Admitting: Thoracic Surgery (Cardiothoracic Vascular Surgery)

## 2023-07-02 ENCOUNTER — Ambulatory Visit: Payer: Medicare HMO

## 2023-07-02 DIAGNOSIS — C3490 Malignant neoplasm of unspecified part of unspecified bronchus or lung: Secondary | ICD-10-CM

## 2023-07-03 ENCOUNTER — Ambulatory Visit
Admission: RE | Admit: 2023-07-03 | Discharge: 2023-07-03 | Disposition: A | Discharge status: Home / Self Care | Payer: Medicare HMO | Source: Ambulatory Visit | Attending: Thoracic Surgery (Cardiothoracic Vascular Surgery) | Admitting: Thoracic Surgery (Cardiothoracic Vascular Surgery)

## 2023-07-03 ENCOUNTER — Ambulatory Visit: Payer: Medicare HMO

## 2023-07-03 ENCOUNTER — Ambulatory Visit: Payer: Self-pay | Admitting: Thoracic Surgery (Cardiothoracic Vascular Surgery)

## 2023-07-03 ENCOUNTER — Ambulatory Visit (INDEPENDENT_AMBULATORY_CARE_PROVIDER_SITE_OTHER): Payer: Self-pay | Admitting: Thoracic Surgery (Cardiothoracic Vascular Surgery)

## 2023-07-03 VITALS — BP 122/79 | HR 89 | Resp 18 | Wt 167.0 lb

## 2023-07-03 DIAGNOSIS — C3490 Malignant neoplasm of unspecified part of unspecified bronchus or lung: Secondary | ICD-10-CM

## 2023-07-03 DIAGNOSIS — Z09 Encounter for follow-up examination after completed treatment for conditions other than malignant neoplasm: Secondary | ICD-10-CM

## 2023-07-03 NOTE — Progress Notes (Signed)
[address]       [name] [address]             [phone]     HPI: Douglas Zhang returns for follow-up after recent left upper lobectomy.  Douglas Zhang is a 68 year old male with a history of metastatic prostate cancer, remote tobacco use, hypertension, hyperlipidemia, type 2 diabetes, and reflux.  Recently noted to have an elevated PSA.  Pylarify PET showed 3 lung nodules with significant activity.  Navigational bronchoscopy showed adenocarcinoma but pathology was unable to determine lung versus prostate primary.  He underwent a right robotic assisted left upper lobectomy.  Pathology was consistent with metastatic primary.  He had atrial fibrillation postoperatively.  He converted to sinus rhythm with amiodarone.    Current Outpatient Medications  Medication Sig Dispense Refill   ACCU-CHEK GUIDE test strip USE 1  ONCE DAILY     amiodarone (PACERONE) 200 MG tablet Take 400 mg two times daily for 2 days;then take 200 mg two times daily for 7 days;then take Amiodarone 200 mg daily thereafter 60 tablet 1   Ascorbic Acid (VITAMIN C PO) Take 1 tablet by mouth daily.     aspirin EC 81 MG tablet Take 81 mg by mouth daily.     atorvastatin (LIPITOR) 40 MG tablet Take 40 mg by mouth daily.     Blood Glucose Monitoring Suppl (ACCU-CHEK GUIDE) w/Device KIT Use to check BS daily     calcium citrate-vitamin D (CITRACAL+D) 315-200 MG-UNIT per tablet Take 1 tablet by mouth 2 (two) times daily.     cetirizine (ZYRTEC) 10 MG tablet Take 10 mg by mouth daily.     Cyanocobalamin (B-12 PO) Take 1 capsule by mouth daily.     gabapentin (NEURONTIN) 300 MG capsule Take 1 capsule by mouth at bedtime 30 capsule 0   glipiZIDE (GLUCOTROL XL) 10 MG 24 hr tablet Take 10 mg by mouth daily.     magnesium oxide (MAG-OX) 400 MG tablet Take 800 mg by mouth daily.     metFORMIN (GLUCOPHAGE-XR) 500 MG 24 hr tablet Take 1,000 mg by mouth 2 (two) times daily.      metoprolol succinate (TOPROL-XL) 25  MG 24 hr tablet Take 1 tablet (25 mg total) by mouth daily. 30 tablet 1   Microlet Lancets MISC USE 1  TO CHECK GLUCOSE ONCE DAILY TO  CHECK  GLUCOSE     montelukast (SINGULAIR) 10 MG tablet Take 10 mg by mouth at bedtime.     Multiple Vitamins-Minerals (CENTRUM SILVER 50+MEN) TABS Take 1 tablet by mouth daily.     Multiple Vitamins-Minerals (ZINC PO) Take 1 tablet by mouth daily.     MYRBETRIQ 50 MG TB24 tablet Take 50 mg by mouth daily.      omeprazole (PRILOSEC) 20 MG capsule Take 20 mg by mouth daily as needed (indigestion).      oxyCODONE (OXY IR/ROXICODONE) 5 MG immediate release tablet Take 1 tablet (5 mg total) by mouth every 6 (six) hours as needed for severe pain (pain score 7-10). 30 tablet 0   OZEMPIC, 0.25 OR 0.5 MG/DOSE, 2 MG/3ML SOPN Inject 0.5 mg as directed every Sunday.     Pseudoephedrine-APAP-DM (DAYQUIL PO) Take 2 capsules by mouth daily as needed (congestion).     SUMAtriptan (IMITREX) 100 MG tablet Take 100 mg by mouth every 2 (two) hours as needed for migraine (migraine).      triamcinolone ointment (KENALOG) 0.1 % APPLY  OINTMENT  TOPICALLY TWICE DAILY     valsartan (DIOVAN) 40 MG tablet Take 1 tablet (40 mg total) by mouth daily. 30 tablet 1   VITAMIN D PO Take 1 capsule by mouth daily.     No current facility-administered medications for this visit.    Physical Exam BP 122/79   Pulse 89   Resp 18   Wt 167 lb (75.8 kg)   SpO2 96% Comment: RA  BMI 24.66 kg/m  Well-appearing 68 year old man in no acute distress Alert and oriented x 3 with no focal deficits Lungs slightly diminished at left base but otherwise clear Incisions well-healed Cardiac regular rate and rhythm  Diagnostic Tests: Chest x-ray shows postoperative changes but no concerning findings  Impression: Douglas Zhang is a 68 year old male with a history of metastatic prostate cancer, remote tobacco use, hypertension, hyperlipidemia, type 2 diabetes, and reflux.    Metastatic prostate cancer-status  post left upper lobectomy.  Still has a nodule in the right upper lobe.  That is going to be treated with stereotactic radiation.  He is scheduled to start that soon.  Status post left upper lobectomy-doing well from a surgical standpoint.  Minimal discomfort.  Not taking any narcotics.  No restrictions on activities from my standpoint.  Postoperative atrial fibrillation-converted to sinus rhythm with amiodarone.  Finish out current prescription of amiodarone and then discontinue  Plan: Finish current prescription of amiodarone then discontinue Follow-up with Dr. Kathrynn Running for radiation Follow-up with Dr. Laverle Patter for monitoring prostate cancer. I will be happy to see Douglas Zhang back anytime in the future if I can be of any further assistance with his care  Loreli Slot, MD [facility] [phone]

## 2023-07-04 ENCOUNTER — Ambulatory Visit
Admission: RE | Admit: 2023-07-04 | Discharge: 2023-07-04 | Disposition: A | Discharge status: Home / Self Care | Payer: Medicare HMO | Source: Ambulatory Visit | Attending: Radiation Oncology | Admitting: Radiation Oncology

## 2023-07-04 ENCOUNTER — Other Ambulatory Visit: Payer: Self-pay

## 2023-07-04 DIAGNOSIS — C61 Malignant neoplasm of prostate: Secondary | ICD-10-CM | POA: Diagnosis not present

## 2023-07-04 LAB — RAD ONC ARIA SESSION SUMMARY
Course Elapsed Days: 0
Plan Fractions Treated to Date: 1
Plan Prescribed Dose Per Fraction: 18 Gy
Plan Total Fractions Prescribed: 3
Plan Total Prescribed Dose: 54 Gy
Reference Point Dosage Given to Date: 18 Gy
Reference Point Session Dosage Given: 18 Gy
Session Number: 1

## 2023-07-05 ENCOUNTER — Ambulatory Visit: Payer: Medicare HMO

## 2023-07-06 ENCOUNTER — Other Ambulatory Visit: Payer: Self-pay

## 2023-07-06 ENCOUNTER — Ambulatory Visit
Admission: RE | Admit: 2023-07-06 | Discharge: 2023-07-06 | Disposition: A | Discharge status: Home / Self Care | Payer: Medicare HMO | Source: Ambulatory Visit | Attending: Radiation Oncology | Admitting: Radiation Oncology

## 2023-07-06 DIAGNOSIS — C61 Malignant neoplasm of prostate: Secondary | ICD-10-CM | POA: Diagnosis not present

## 2023-07-06 LAB — RAD ONC ARIA SESSION SUMMARY
Course Elapsed Days: 2
Plan Fractions Treated to Date: 2
Plan Prescribed Dose Per Fraction: 18 Gy
Plan Total Fractions Prescribed: 3
Plan Total Prescribed Dose: 54 Gy
Reference Point Dosage Given to Date: 36 Gy
Reference Point Session Dosage Given: 18 Gy
Session Number: 2

## 2023-07-09 ENCOUNTER — Ambulatory Visit
Admission: RE | Admit: 2023-07-09 | Discharge: 2023-07-09 | Disposition: A | Discharge status: Home / Self Care | Payer: Medicare HMO | Source: Ambulatory Visit | Attending: Radiation Oncology | Admitting: Radiation Oncology

## 2023-07-09 ENCOUNTER — Other Ambulatory Visit: Payer: Self-pay

## 2023-07-09 DIAGNOSIS — C61 Malignant neoplasm of prostate: Secondary | ICD-10-CM | POA: Insufficient documentation

## 2023-07-09 DIAGNOSIS — C7800 Secondary malignant neoplasm of unspecified lung: Secondary | ICD-10-CM | POA: Diagnosis not present

## 2023-07-09 DIAGNOSIS — Z510 Encounter for antineoplastic radiation therapy: Secondary | ICD-10-CM | POA: Diagnosis present

## 2023-07-09 LAB — RAD ONC ARIA SESSION SUMMARY
Course Elapsed Days: 5
Plan Fractions Treated to Date: 3
Plan Prescribed Dose Per Fraction: 18 Gy
Plan Total Fractions Prescribed: 3
Plan Total Prescribed Dose: 54 Gy
Reference Point Dosage Given to Date: 54 Gy
Reference Point Session Dosage Given: 18 Gy
Session Number: 3

## 2023-07-10 ENCOUNTER — Ambulatory Visit: Payer: Medicare HMO

## 2023-07-10 NOTE — Radiation Completion Notes (Addendum)
  Radiation Oncology         [phone]) [phone] ________________________________  Name: Douglas Zhang MRN: [id number]  Date: 07/09/2023  DOB: 1956/04/21  Referring Physician: Charlett Lango, M.D. Date of Service: 2023-07-10 Radiation Oncologist: Margaretmary Bayley, M.D. [facility]     RADIATION ONCOLOGY END OF TREATMENT NOTE     Diagnosis: 68 yo gentleman with oligometastatic prostate cancer involving the lungs; s/p prostatectomy in 2012 and salvage prostate fossa radiation in 2014.  Intent: Curative     ==========DELIVERED PLANS==========  First Treatment Date: 2023-07-04 Last Treatment Date: 2023-07-09   Plan Name: Lung_R_SBRT Site: Lung, Right Technique: SBRT/SRT-IMRT Mode: Photon Dose Per Fraction: 18 Gy Prescribed Dose (Delivered / Prescribed): 54 Gy / 54 Gy Prescribed Fxs (Delivered / Prescribed): 3 / 3     ==========ON TREATMENT VISIT DATES========== 2023-07-04, 2023-07-06, 2023-07-06, 2023-07-09   See weekly On Treatment Notes in Epic for details in the Media tab (listed as Progress notes on the On Treatment Visit Dates listed above).  He tolerated the treatments well with only mild esophagitis and modest fatigue.  The patient will receive a call in about one month from the radiation oncology department. He will continue follow up with his urologist, Dr. Laverle Patter and medical oncologist, Dr. Cherly Hensen, as well.  ------------------------------------------------   Margaretmary Dys, MD [facility]  Radiation Oncology Direct Dial: [phone]  Fax: [phone] [email or web address]  Skype  LinkedIn

## 2023-07-12 ENCOUNTER — Ambulatory Visit: Payer: Medicare HMO

## 2023-07-16 ENCOUNTER — Telehealth: Payer: Self-pay | Admitting: Genetic Counselor

## 2023-07-16 NOTE — Telephone Encounter (Signed)
 Rescheduled appointments per staff message and patient request. Patient is aware of the changes made and will be mailed an appointment reminder.

## 2023-08-07 ENCOUNTER — Ambulatory Visit
Admission: RE | Admit: 2023-08-07 | Discharge: 2023-08-07 | Disposition: A | Discharge status: Home / Self Care | Payer: Medicare HMO | Source: Ambulatory Visit | Attending: Radiation Oncology | Admitting: Radiation Oncology

## 2023-08-07 NOTE — Progress Notes (Signed)
  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   Symptoms of fatigue have improved since completing therapy.  Symptoms of skin changes have improved since completing therapy.  Symptoms of esophagitis have improved since completing therapy.  The patient has scheduled follow up with his medical oncologist Dr. Dorris Fetch for ongoing care, and was encouraged to call if he develops concerns or questions regarding radiation.  This concludes the interaction.  Ruel Favors, LPN

## 2023-08-09 ENCOUNTER — Other Ambulatory Visit: Payer: Self-pay | Admitting: Physician Assistant

## 2023-08-31 NOTE — Addendum Note (Signed)
 Encounter addended by: Marcello Fennel, PA-C on: 08/31/2023 4:22 PM  Actions taken: Clinical Note Signed

## 2023-09-03 ENCOUNTER — Encounter (HOSPITAL_COMMUNITY)
Admission: RE | Admit: 2023-09-03 | Discharge: 2023-09-03 | Disposition: A | Discharge status: Home / Self Care | Payer: Medicare HMO | Source: Ambulatory Visit

## 2023-09-03 DIAGNOSIS — C61 Malignant neoplasm of prostate: Secondary | ICD-10-CM | POA: Insufficient documentation

## 2023-09-03 DIAGNOSIS — C7800 Secondary malignant neoplasm of unspecified lung: Secondary | ICD-10-CM | POA: Insufficient documentation

## 2023-09-03 MED ORDER — FLOTUFOLASTAT F 18 GALLIUM 296-5846 MBQ/ML IV SOLN
8.2400 | Freq: Once | INTRAVENOUS | Status: AC
  Administered 2023-09-03: 8.24 via INTRAVENOUS

## 2023-09-04 ENCOUNTER — Telehealth: Payer: Self-pay | Admitting: *Deleted

## 2023-09-04 NOTE — Telephone Encounter (Signed)
-----   Message from Melven Sartorius sent at 09/04/2023  8:23 AM EDT ----- Phil Dopp, would you let him know good news. No findings of prostate cancer on PET. Would you see if he can get lab done 2-3 days before visit so we can have results by the time he sees me? PSA takes about 24 hours. His lab is currently scheduled right before the appointment. Thanks.

## 2023-09-04 NOTE — Telephone Encounter (Signed)
 Notified of message below. Will come in on 4/3 @ 1200. Message to scheduler

## 2023-09-04 NOTE — Telephone Encounter (Signed)
 LM to call back.

## 2023-09-06 ENCOUNTER — Inpatient Hospital Stay: Attending: Internal Medicine

## 2023-09-06 DIAGNOSIS — M4854XA Collapsed vertebra, not elsewhere classified, thoracic region, initial encounter for fracture: Secondary | ICD-10-CM | POA: Diagnosis not present

## 2023-09-06 DIAGNOSIS — Z923 Personal history of irradiation: Secondary | ICD-10-CM | POA: Diagnosis not present

## 2023-09-06 DIAGNOSIS — C7800 Secondary malignant neoplasm of unspecified lung: Secondary | ICD-10-CM | POA: Diagnosis not present

## 2023-09-06 DIAGNOSIS — C61 Malignant neoplasm of prostate: Secondary | ICD-10-CM | POA: Insufficient documentation

## 2023-09-06 LAB — CBC WITH DIFFERENTIAL (CANCER CENTER ONLY)
Abs Immature Granulocytes: 0.03 10*3/uL (ref 0.00–0.07)
Basophils Absolute: 0.1 10*3/uL (ref 0.0–0.1)
Basophils Relative: 1 %
Eosinophils Absolute: 0.1 10*3/uL (ref 0.0–0.5)
Eosinophils Relative: 1 %
HCT: 40.2 % (ref 39.0–52.0)
Hemoglobin: 13.2 g/dL (ref 13.0–17.0)
Immature Granulocytes: 0 %
Lymphocytes Relative: 10 %
Lymphs Abs: 0.8 10*3/uL (ref 0.7–4.0)
MCH: 29.2 pg (ref 26.0–34.0)
MCHC: 32.8 g/dL (ref 30.0–36.0)
MCV: 88.9 fL (ref 80.0–100.0)
Monocytes Absolute: 0.6 10*3/uL (ref 0.1–1.0)
Monocytes Relative: 8 %
Neutro Abs: 6 10*3/uL (ref 1.7–7.7)
Neutrophils Relative %: 80 %
Platelet Count: 169 10*3/uL (ref 150–400)
RBC: 4.52 MIL/uL (ref 4.22–5.81)
RDW: 12.8 % (ref 11.5–15.5)
WBC Count: 7.6 10*3/uL (ref 4.0–10.5)
nRBC: 0 % (ref 0.0–0.2)

## 2023-09-06 LAB — CMP (CANCER CENTER ONLY)
ALT: 39 U/L (ref 0–44)
AST: 31 U/L (ref 15–41)
Albumin: 4.3 g/dL (ref 3.5–5.0)
Alkaline Phosphatase: 77 U/L (ref 38–126)
Anion gap: 9 (ref 5–15)
BUN: 30 mg/dL — ABNORMAL HIGH (ref 8–23)
CO2: 26 mmol/L (ref 22–32)
Calcium: 9.6 mg/dL (ref 8.9–10.3)
Chloride: 105 mmol/L (ref 98–111)
Creatinine: 1.46 mg/dL — ABNORMAL HIGH (ref 0.61–1.24)
GFR, Estimated: 52 mL/min — ABNORMAL LOW (ref >60)
Glucose, Bld: 151 mg/dL — ABNORMAL HIGH (ref 70–99)
Potassium: 4.5 mmol/L (ref 3.5–5.1)
Sodium: 140 mmol/L (ref 135–145)
Total Bilirubin: 0.5 mg/dL (ref 0.0–1.2)
Total Protein: 6.9 g/dL (ref 6.5–8.1)

## 2023-09-06 LAB — LACTATE DEHYDROGENASE: LDH: 134 U/L (ref 98–192)

## 2023-09-07 LAB — PSA, TOTAL AND FREE
PSA, Free Pct: <20 %
PSA, Free: <0.02 ng/mL
Prostate Specific Ag, Serum: 0.1 ng/mL (ref 0.0–4.0)

## 2023-09-07 LAB — TESTOSTERONE: Testosterone: 368 ng/dL (ref 264–916)

## 2023-09-09 NOTE — Progress Notes (Unsigned)
 [facility] OFFICE PROGRESS NOTE  Patient Care Team: Paulino Door, MD as PCP - General (Family Medicine)  Douglas Zhang is a 68 y.o.male with history of prostate cancer status post prostatectomy, salvage radiation in 2014, diabetes, hypertension, hyperlipidemia, GERD, arthritis being seen at [facility] for metastatic prostate cancer. He is currently with hormone sensitive metastatic disease.   Current diagnosis: mHSPC Initial diagnosis: [id number] Gleason score of 8 (4+4) +ECE, + Margin involved L lobe from prostatectomy (2012) followed by salvage radiation in 2014 Germline testing: not yet Somatic testing: not yet Previous Treatment: 2012 RP 2014 Salvage radiation 1/29-07/09/23 SBRT right lung    New PET showed no evidence if avid disease. Assessment & Plan   No orders of the defined types were placed in this encounter.    Douglas Sartorius, MD  INTERVAL HISTORY: Patient returns for follow-up.  Oncology History  Malignant neoplasm of prostate metastatic to lung [facility])  02/2011 Pathology Results   Initial diagnosis with a Gleason score of 8 (4+4) +ECE, + Margin involved L lobe from prostatectomy followed by radiation    02/16/2023 PET scan   PSMA PET 1. Three radiotracer avid pulmonary nodules most consistent with prostate cancer pulmonary metastasis. 2. No evidence of local prostate carcinoma recurrence in the prostate fossa. 3. No evidence of metastatic adenopathy in the pelvis or periaortic retroperitoneum. 4. No evidence of skeletal metastasis.   04/09/2023 Initial Diagnosis   Malignant neoplasm of prostate metastatic to lung [facility])   04/18/2023 Imaging   MRI brain: Normal examination. No evidence of metastatic disease.    05/11/2023 Surgery   FINAL MICROSCOPIC DIAGNOSIS:   A. LYMPH NODE, LEVEL 9, EXCISION:       One lymph node, negative for metastatic carcinoma (0/1).   B. LYMPH NODE, LEVEL 8, EXCISION:       One lymph node, negative for metastatic  carcinoma (0/1).   C. LYMPH NODE, LEVEL 9 #2, EXCISION:       One lymph node, negative for metastatic carcinoma (0/1).   D. LYMPH NODE, LEVEL 9 #3, EXCISION:       One lymph node, negative for metastatic carcinoma (0/1).   E. LYMPH NODE, LEVEL 7, EXCISION:       One lymph node, negative for metastatic carcinoma (0/1).   F. LYMPH NODE, LEVEL 10, EXCISION:       One lymph node, negative for metastatic carcinoma (0/1).   G. LYMPH NODE, LEVEL 5, EXCISION:       One lymph node, negative for metastatic carcinoma (0/1).   H. LYMPH NODE, LEVEL 11, EXCISION:       One lymph node, negative for metastatic carcinoma (0/1).   I. LYMPH NODE, LEVEL 10 #2, EXCISION:       One lymph node, negative for metastatic carcinoma (0/1).   J. LYMPH NODE, LEVEL 12, EXCISION:       One lymph node, negative for metastatic carcinoma (0/1).   K. LYMPH NODE, LEVEL 11 #2, EXCISION:       One lymph node, negative for metastatic carcinoma (0/1).   L. LUNG, LEFT UPPER LOBE, LOBECTOMY:       Metastatic prostatic adenocarcinoma.       Lymphovascular invasion identified.       Surgical margins of resection are negative for carcinoma.       Seven lymph nodes, negative for metastatic carcinoma (0/7).       See comment.   COMMENT:   The lobectomy specimen demonstrates a complex  glandular proliferation  forming cribriform architecture with multiple satellite foci.  Immunohistochemical stains show the cells are strongly and diffusely  positive for NKX3.1 and are negative for TTF-1. The patients remote  history of prostatic carcinoma is noted. The findings are consistent  with metastasis from the patients known prostatic adenocarcinoma with  Gleason score 4+4=8. Lymphovascular invasion is seen. Surgical margins  are negative for carcinoma.   Controls worked appropriately.    06/19/2023 Cancer Staging   Staging form: Prostate, AJCC 8th Edition - Clinical: Stage IVB (cTX, cN0, pM1a, Grade Group: 4) - Signed by  Douglas Sartorius, MD on 06/19/2023 Gleason score: 8 Histologic grading system: 5 grade system      PHYSICAL EXAMINATION: ECOG PERFORMANCE STATUS: {CHL ONC ECOG PS:[phone]}  There were no vitals filed for this visit. There were no vitals filed for this visit.  Relevant data reviewed during this visit included ***

## 2023-09-10 ENCOUNTER — Inpatient Hospital Stay: Payer: Medicare HMO

## 2023-09-10 VITALS — BP 132/81 | HR 71 | Temp 97.6°F | Resp 18 | Wt 171.2 lb

## 2023-09-10 DIAGNOSIS — Z8781 Personal history of (healed) traumatic fracture: Secondary | ICD-10-CM

## 2023-09-10 DIAGNOSIS — C7800 Secondary malignant neoplasm of unspecified lung: Secondary | ICD-10-CM

## 2023-09-10 DIAGNOSIS — C61 Malignant neoplasm of prostate: Secondary | ICD-10-CM | POA: Diagnosis not present

## 2023-09-10 NOTE — Assessment & Plan Note (Addendum)
 CBC, CMP, PSA, testosterone in about 3 months.  If rising PSA again quickly, can repeat PET and if still oligometastasis, consider SBRT if feasible If significant disease burden, will consider ADT/ARPI Referral for genetic test pending

## 2023-09-10 NOTE — Assessment & Plan Note (Addendum)
 Recommend daily vitamin D and calcium.

## 2023-09-21 ENCOUNTER — Ambulatory Visit: Payer: Medicare HMO | Attending: Cardiology | Admitting: Cardiology

## 2023-09-21 ENCOUNTER — Encounter: Payer: Self-pay | Admitting: Cardiology

## 2023-09-21 VITALS — BP 132/83 | HR 76 | Ht 69.0 in | Wt 173.0 lb

## 2023-09-21 DIAGNOSIS — I9789 Other postprocedural complications and disorders of the circulatory system, not elsewhere classified: Secondary | ICD-10-CM

## 2023-09-21 DIAGNOSIS — I700 Atherosclerosis of aorta: Secondary | ICD-10-CM | POA: Diagnosis not present

## 2023-09-21 DIAGNOSIS — C7800 Secondary malignant neoplasm of unspecified lung: Secondary | ICD-10-CM

## 2023-09-21 DIAGNOSIS — I4891 Unspecified atrial fibrillation: Secondary | ICD-10-CM | POA: Diagnosis not present

## 2023-09-21 DIAGNOSIS — I10 Essential (primary) hypertension: Secondary | ICD-10-CM

## 2023-09-21 DIAGNOSIS — I2510 Atherosclerotic heart disease of native coronary artery without angina pectoris: Secondary | ICD-10-CM | POA: Diagnosis not present

## 2023-09-21 DIAGNOSIS — I2584 Coronary atherosclerosis due to calcified coronary lesion: Secondary | ICD-10-CM

## 2023-09-21 DIAGNOSIS — C61 Malignant neoplasm of prostate: Secondary | ICD-10-CM

## 2023-09-21 DIAGNOSIS — Z9889 Other specified postprocedural states: Secondary | ICD-10-CM

## 2023-09-21 DIAGNOSIS — R918 Other nonspecific abnormal finding of lung field: Secondary | ICD-10-CM

## 2023-09-21 DIAGNOSIS — Z79899 Other long term (current) drug therapy: Secondary | ICD-10-CM

## 2023-09-21 NOTE — Progress Notes (Signed)
 Cardiology Office Note:    NAME:  Douglas Zhang    MRN: [id number] DOB:  1956-01-26   PCP:  Mirna Anes, MD  Former Cardiology Providers: N/A Primary Cardiologist:  Madonna Large, DO, [facility] (established care 09/21/2023) Electrophysiologist:  None   Referring MD: Mirna Anes, MD  Reason of Consult: Atrial fibrillation management  Chief Complaint  Patient presents with   Atrial Fibrillation   New Patient (Initial Visit)    History of Present Illness:    Douglas Zhang is a 68 y.o. 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 He is being seen today for the evaluation of atrial fibrillation at the request of Mirna Anes, MD.  He underwent PET scan demonstrating metabolically active nodule in the left upper lobe. Bronchoscopy done in October showed non-small cell cancer, could not tell if it was primary lung versus metastatic from prostate. He subsequently underwent a robotic assisted left upper lobectomy on May 11 2023 by Dr. Kerrin in [place]. He has been referred to radiation oncology, Dr. Prentis. MRI brain was negative for metastatic disease. During his hospital stay, he was noted to develop atrial fibrillation and flutter for which he was treated with IV amiodarone  and IV metoprolol , and subsequently sent home with amiodarone  200 mg daily and Toprol -XL 25 mg daily. He had converted to sinus rhythm before discharge. He was advised he would need to see cardiology.  Based on Dr. Candiss progress note from May 28, 2023 patient had some atrial fibrillation postoperatively and converted to sinus with amiodarone  and was sent home on oral amiodarone .  Patient remains on amiodarone  200 mg p.o. daily but currently not on  anticoagulation for thromboembolic prophylaxis despite his CHA2DS2-VASc SCORE.  Patient does not appreciate when he goes in the 100 A-fib to the best of his knowledge has not had any reoccurrence.  He denies anginal chest pain or heart failure symptoms.  Current Medications: Current Meds  Medication Sig   Pseudoephedrine-APAP-DM (DAYQUIL PO) Take 2 capsules by mouth daily as needed (congestion).     Allergies:    Patient has no known allergies.   Past Medical History: Past Medical History:  Diagnosis Date   AP (acute pancreatitis)    due to gallstones, history   Arthritis    Cataract    no surgery, just forming   COVID-19    Diabetes mellitus without complication (HCC)    ED (erectile dysfunction)    GERD (gastroesophageal reflux disease)    Headache(784.0)    migraines   History of hiatal hernia    Hx of migraines    Hyperlipidemia    Hypertension    Prostate cancer (HCC) 01/09/2011   biopsy-gleason 4+4=8   Radiation 07/03/2012-08/21/2012   prostate 6600 cGy 33 sessions   Stress incontinence, male     Past Surgical History: Past Surgical History:  Procedure Laterality Date   BRONCHIAL BIOPSY  03/27/2023   Procedure: BRONCHIAL BIOPSIES;  Surgeon: Shelah Lamar RAMAN, MD;  Location: [facility] ENDOSCOPY;  Service: Pulmonary;;   BRONCHIAL BRUSHINGS  03/27/2023   Procedure: BRONCHIAL BRUSHINGS;  Surgeon: Shelah Lamar RAMAN, MD;  Location[facility] ENDOSCOPY;  Service: Pulmonary;;   BRONCHIAL NEEDLE ASPIRATION BIOPSY  03/27/2023   Procedure: BRONCHIAL NEEDLE ASPIRATION BIOPSIES;  Surgeon: Shelah Lamar RAMAN, MD;  Location: [facility] ENDOSCOPY;  Service: Pulmonary;;   CATARACT  EXTRACTION Bilateral    CHOLECYSTECTOMY  06/05/1988   CHOLECYSTECTOMY, LAPAROSCOPIC     CYSTOSCOPY WITH BIOPSY N/A 06/12/2013   Procedure: CYSTOSCOPY WITH BIOPSY bladder washing;  Surgeon: Noretta Ferrara, MD;  Location: [facility] ORS;  Service: Urology;  Laterality: N/A;  BLADDER BIOPSY CYTOLOGY EXAM UNDER ANESTHESIA     INGUINAL  HERNIA REPAIR     bi-lateral   INTERCOSTAL NERVE BLOCK Left 05/11/2023   Procedure: INTERCOSTAL NERVE BLOCK;  Surgeon: Kerrin Elspeth BROCKS, MD;  Location: [facility] OR;  Service: Thoracic;  Laterality: Left;   NODE DISSECTION Left 05/11/2023   Procedure: NODE DISSECTION;  Surgeon: Kerrin Elspeth BROCKS, MD;  Location[facility] OR;  Service: Thoracic;  Laterality: Left;   PROSTATE SURGERY  03/02/2011   gleason 4+4=8   VIDEO BRONCHOSCOPY WITH RADIAL ENDOBRONCHIAL ULTRASOUND  03/27/2023   Procedure: VIDEO BRONCHOSCOPY WITH RADIAL ENDOBRONCHIAL ULTRASOUND;  Surgeon: Shelah Lamar RAMAN, MD;  Location: [facility] ENDOSCOPY;  Service: Pulmonary;;    Social History: Social History   Tobacco Use   Smoking status: Former    Current packs/day: 0.00    Average packs/day: 1 pack/day for 5.0 years (5.0 ttl pk-yrs)    Types: Cigarettes    Start date: 01/30/1978    Quit date: 01/31/1983    Years since quitting: 40.6   Smokeless tobacco: Never  Vaping Use   Vaping status: Never Used  Substance Use Topics   Alcohol use: No   Drug use: No    Family History: Family History  Problem Relation Age of Onset   Stroke Father    Cancer Maternal Grandmother        colon cancer    ROS:   Review of Systems  Cardiovascular:  Negative for chest pain, claudication, irregular heartbeat, leg swelling, near-syncope, orthopnea, palpitations, paroxysmal nocturnal dyspnea and syncope.  Respiratory:  Negative for shortness of breath.   Hematologic/Lymphatic: Negative for bleeding problem.    EKGs/Labs/Other Studies Reviewed:   EKG: EKG Interpretation Date/Time:  Friday September 21 2023 11:08:03 EDT Ventricular Rate:  76 PR Interval:  210 QRS Duration:  130 QT Interval:  422 QTC Calculation: 474 R Axis:   -15  Text Interpretation: Sinus rhythm with 1st degree A-V block Right bundle branch block When compared with ECG of 16-May-2023 07:33, Since last tracing rate slower Confirmed by Michele Richardson [phone]) on 09/21/2023 11:43:39  AM  Echocardiogram: NA  Stress Testing:  NA   Labs:    Latest Ref Rng & Units 09/06/2023   11:51 AM 05/14/2023    1:57 AM 05/13/2023    2:02 AM  CBC  WBC 4.0 - 10.5 K/uL 7.6  6.0  7.1   Hemoglobin 13.0 - 17.0 g/dL 86.7  89.7  9.6   Hematocrit 39.0 - 52.0 % 40.2  30.5  28.7   Platelets 150 - 400 K/uL 169  152  122        Latest Ref Rng & Units 09/06/2023   11:51 AM 05/16/2023    9:09 AM 05/15/2023    7:30 AM  BMP  Glucose 70 - 99 mg/dL 848  795  817   BUN 8 - 23 mg/dL 30  28  27    Creatinine 0.61 - 1.24 mg/dL 8.53  8.67  8.71   Sodium 135 - 145 mmol/L 140  132  134   Potassium 3.5 - 5.1 mmol/L 4.5  5.0  5.2   Chloride 98 - 111 mmol/L 105  98  100   CO2 22 - 32 mmol/L 26  27  25   Calcium  8.9 - 10.3 mg/dL 9.6  9.2  9.2       Latest Ref Rng & Units 09/06/2023   11:51 AM 05/16/2023    9:09 AM 05/15/2023    7:30 AM  CMP  Glucose 70 - 99 mg/dL 848  795  817   BUN 8 - 23 mg/dL 30  28  27    Creatinine 0.61 - 1.24 mg/dL 8.53  8.67  8.71   Sodium 135 - 145 mmol/L 140  132  134   Potassium 3.5 - 5.1 mmol/L 4.5  5.0  5.2   Chloride 98 - 111 mmol/L 105  98  100   CO2 22 - 32 mmol/L 26  27  25    Calcium  8.9 - 10.3 mg/dL 9.6  9.2  9.2   Total Protein 6.5 - 8.1 g/dL 6.9     Total Bilirubin 0.0 - 1.2 mg/dL 0.5     Alkaline Phos 38 - 126 U/L 77     AST 15 - 41 U/L 31     ALT 0 - 44 U/L 39       No results found for: CHOL, HDL, LDLCALC, LDLDIRECT, TRIG, CHOLHDL No results for input(s): LIPOA in the last 8760 hours. No components found for: NTPROBNP No results for input(s): PROBNP in the last 8760 hours. No results for input(s): TSH in the last 8760 hours.  Risk Assessment/Calculations:   Click Here to Calculate/Change CHADS2VASc Score The patient's CHADS2-VASc score is 4, indicating a 4.8% annual risk of stroke.   CHF History: No HTN History: Yes Diabetes History: Yes Stroke History: No Vascular Disease History: Yes  Physical Exam:    Today's Vitals    09/21/23 1105  BP: 132/83  Pulse: 76  SpO2: 96%  Weight: 173 lb (78.5 kg)  Height: 5' 9 (1.753 m)   Body mass index is 25.55 kg/m. Wt Readings from Last 3 Encounters:  09/21/23 173 lb (78.5 kg)  09/10/23 171 lb 3.2 oz (77.7 kg)  07/03/23 167 lb (75.8 kg)    Physical Exam  Constitutional: No distress.  hemodynamically stable  Neck: No JVD present.  Cardiovascular: Normal rate, regular rhythm, S1 normal and S2 normal. Exam reveals no gallop, no S3 and no S4.  No murmur heard. Pulmonary/Chest: Effort normal and breath sounds normal. No stridor. He has no wheezes. He has no rales.  Musculoskeletal:        General: No edema.     Cervical back: Neck supple.  Skin: Skin is warm.     Impression & Recommendation(s):  Impression:   ICD-10-CM   1. Postoperative atrial fibrillation (HCC)  I97.89 EKG 12-Lead   I48.91 Ambulatory referral to Cardiac Electrophysiology    2. Long term current use of antiarrhythmic drug  Z79.899     3. Atherosclerosis of aorta (HCC)  I70.0 ECHOCARDIOGRAM COMPLETE    4. Coronary atherosclerosis due to calcified coronary lesion  I25.10 ECHOCARDIOGRAM COMPLETE   I25.84     5. Benign hypertension  I10     6. Pulmonary nodules  R91.8     7. 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 Was not sent home on amiodarone  200 mg p.o. daily as well as Toprol -XL 25 mg p.o. daily. He is now referred to cardiology  for postoperative A-fib management. Patient is not new if he goes in and out of A-fib and has no way of monitoring his underlying rhythm. His CHA2DS2-VASc score is 4. He remains on antiarrhythmic medications without long-term follow-up with side effects.  He also has underlying prostate cancer with lung metastases status post lobectomy. We  discussed the pathophysiology of atrial fibrillation. Amiodarone  likely not the ideal drug of choice given his prior lobectomy and lung metastases. Discontinue amiodarone . Will continue on rate control strategy for now. Patient would like to hold off on starting oral anticoagulation. He understands his thromboembolic risk. He does agree with undergoing implantable loop recorder for atrial fibrillation monitoring.  Other modalities discussed include smart watch is better FDA approved, [employer] mobile, etc. discussed the risk, benefits, and alternatives to implantable loop recorder and patient is agreeable.  Patient is aware that ILR recording as a recurrent fee or co-pay and patient is agreeable. Will refer him to EP for ILR implant for A-fib monitoring-he is at risk of developing it further given his underlying cancer, hypercoagulable state, etc.  Long term current use of antiarrhythmic drug Has been on amiodarone  since his his lobectomy. It has not been monitored for side effects.   Also not an ideal drug for reasons mentioned above.  Atherosclerosis of aorta (HCC) Coronary atherosclerosis due to calcified coronary lesion Continue aspirin  and statin therapy. Does not endorse anginal chest pain. Echo will be ordered to evaluate for structural heart disease and left ventricular systolic function.  Benign hypertension Office blood pressures are well-controlled. Continue Toprol -XL 25 mg p.o. daily. Continue Diovan  40 mg p.o. daily  Orders Placed:  Orders Placed This Encounter  Procedures   Ambulatory referral to Cardiac Electrophysiology    Referral Priority:   Routine    Referral Type:   Consultation    Referral Reason:   Specialty Services Required    Requested Specialty:   Cardiology    Number of Visits Requested:   1   EKG 12-Lead   ECHOCARDIOGRAM COMPLETE    Standing Status:   Future    Expected Date:   03/05/2024    Expiration Date:   09/20/2024    Where should this test be  performed:   [facility])    Does the patient weigh less than or greater than 250 lbs?:   Patient weighs less than 250 lbs    Perflutren DEFINITY (image enhancing agent) should be administered unless hypersensitivity or allergy exist:   Administer Perflutren    Reason for exam-Echo:   Other-Full Diagnosis List    Full ICD-10/Reason for Exam:   Aortic atherosclerosis (HCC) [767230]   Final Medication List:   No orders of the defined types were placed in this encounter.   There are no discontinued medications.   Current Outpatient Medications:    Pseudoephedrine-APAP-DM (DAYQUIL PO), Take 2 capsules by mouth daily as needed (congestion)., Disp: , Rfl:    ACCU-CHEK GUIDE test strip, USE 1  ONCE DAILY, Disp: , Rfl:    amiodarone  (PACERONE ) 200 MG tablet, Take 400 mg two times daily for 2 days;then take 200 mg two times daily for 7 days;then take Amiodarone  200 mg daily thereafter, Disp: 60 tablet, Rfl: 1   Ascorbic Acid (VITAMIN C PO), Take 1 tablet by mouth daily., Disp: , Rfl:    aspirin  EC 81 MG tablet, Take 81 mg by mouth daily., Disp: , Rfl:    atorvastatin  (LIPITOR) 40 MG tablet, Take 40 mg by mouth daily.,  Disp: , Rfl:    Blood Glucose Monitoring Suppl (ACCU-CHEK GUIDE) w/Device KIT, Use to check BS daily, Disp: , Rfl:    calcium  citrate-vitamin D (CITRACAL+D) 315-200 MG-UNIT per tablet, Take 1 tablet by mouth 2 (two) times daily., Disp: , Rfl:    cetirizine (ZYRTEC) 10 MG tablet, Take 10 mg by mouth daily., Disp: , Rfl:    Cyanocobalamin (B-12 PO), Take 1 capsule by mouth daily., Disp: , Rfl:    gabapentin  (NEURONTIN ) 300 MG capsule, Take 1 capsule by mouth at bedtime, Disp: 30 capsule, Rfl: 0   glipiZIDE  (GLUCOTROL  XL) 10 MG 24 hr tablet, Take 10 mg by mouth daily., Disp: , Rfl:    magnesium  oxide (MAG-OX) 400 MG tablet, Take 800 mg by mouth daily., Disp: , Rfl:    metFORMIN  (GLUCOPHAGE -XR) 500 MG 24 hr tablet, Take 1,000 mg by mouth 2 (two) times daily. , Disp: ,  Rfl:    metoprolol  succinate (TOPROL -XL) 25 MG 24 hr tablet, Take 1 tablet (25 mg total) by mouth daily., Disp: 30 tablet, Rfl: 1   Microlet Lancets MISC, USE 1  TO CHECK GLUCOSE ONCE DAILY TO  CHECK  GLUCOSE, Disp: , Rfl:    montelukast  (SINGULAIR ) 10 MG tablet, Take 10 mg by mouth at bedtime., Disp: , Rfl:    Multiple Vitamins-Minerals (CENTRUM SILVER 50+MEN) TABS, Take 1 tablet by mouth daily., Disp: , Rfl:    Multiple Vitamins-Minerals (ZINC PO), Take 1 tablet by mouth daily., Disp: , Rfl:    MYRBETRIQ  50 MG TB24 tablet, Take 50 mg by mouth daily. , Disp: , Rfl:    omeprazole (PRILOSEC) 20 MG capsule, Take 20 mg by mouth daily as needed (indigestion). , Disp: , Rfl:    oxyCODONE  (OXY IR/ROXICODONE ) 5 MG immediate release tablet, Take 1 tablet (5 mg total) by mouth every 6 (six) hours as needed for severe pain (pain score 7-10). (Patient not taking: Reported on 09/21/2023), Disp: 30 tablet, Rfl: 0   OZEMPIC, 0.25 OR 0.5 MG/DOSE, 2 MG/3ML SOPN, Inject 0.5 mg as directed every Sunday., Disp: , Rfl:    SUMAtriptan  (IMITREX ) 100 MG tablet, Take 100 mg by mouth every 2 (two) hours as needed for migraine (migraine). , Disp: , Rfl:    triamcinolone  ointment (KENALOG ) 0.1 %, APPLY  OINTMENT TOPICALLY TWICE DAILY, Disp: , Rfl:    valsartan  (DIOVAN ) 40 MG tablet, Take 1 tablet (40 mg total) by mouth daily., Disp: 30 tablet, Rfl: 1   VITAMIN D PO, Take 1 capsule by mouth daily., Disp: , Rfl:   Consent:   N/A  Disposition:   6 months sooner if needed. Patient may be asked to follow-up sooner based on the results of the above-mentioned testing.  His questions and concerns were addressed to his satisfaction. He voices understanding of the recommendations provided during this encounter.    Signed, Madonna Large, DO, [facility]  [facility]  [address] [place], [place] [address]

## 2023-09-21 NOTE — Patient Instructions (Signed)
 Medication Instructions:  Your physician has recommended you make the following change in your medication:   STOP Amiodarone     *If you need a refill on your cardiac medications before your next appointment, please call your pharmacy*  Lab Work: None ordered today. If you have labs (blood work) drawn today and your tests are completely normal, you will receive your results only by: MyChart Message (if you have MyChart) OR A paper copy in the mail If you have any lab test that is abnormal or we need to change your treatment, we will call you to review the results.  Testing/Procedures: Your physician has requested that you have an echocardiogram in 6 months prior to follow-up with Dr. Albert Huff. Echocardiography is a painless test that uses sound waves to create images of your heart. It provides your doctor with information about the size and shape of your heart and how well your heart's chambers and valves are working. This procedure takes approximately one hour. There are no restrictions for this procedure. Please do NOT wear cologne, perfume, aftershave, or lotions (deodorant is allowed). Please arrive 15 minutes prior to your appointment time.  Please note: We ask at that you not bring children with you during ultrasound (echo/ vascular) testing. Due to room size and safety concerns, children are not allowed in the ultrasound rooms during exams. Our front office staff cannot provide observation of children in our lobby area while testing is being conducted. An adult accompanying a patient to their appointment will only be allowed in the ultrasound room at the discretion of the ultrasound technician under special circumstances. We apologize for any inconvenience.   Follow-Up: At [facility], you and your health needs are our priority.  As part of our continuing mission to provide you with exceptional heart care, we have created designated Provider Care Teams.  These Care Teams include your  primary Cardiologist (physician) and Advanced Practice Providers (APPs -  Physician Assistants and Nurse Practitioners) who all work together to provide you with the care you need, when you need it.  Your next appointment:   6 month(s)  The format for your next appointment:   In Person  Provider:   Olinda Bertrand, [facility]  Other Instructions You have been referred to electrophysiology to discuss a loop recorder implant. Someone will be reaching out to you to schedule this appointment.     1st Floor: - Lobby - Registration  - Pharmacy  - Lab - Cafe  2nd Floor: - PV Lab - Diagnostic Testing (echo, CT, nuclear med)  3rd Floor: - Vacant  4th Floor: - TCTS (cardiothoracic surgery) - AFib Clinic - Structural Heart Clinic - Vascular Surgery  - Vascular Ultrasound  5th Floor: - [facility]Care Cardiology (general and EP) - Clinical Pharmacy for coumadin, hypertension, lipid, weight-loss medications, and med management appointments    Valet parking services will be available as well.

## 2023-09-23 ENCOUNTER — Encounter: Payer: Self-pay | Admitting: Cardiology

## 2023-09-28 ENCOUNTER — Other Ambulatory Visit: Payer: Medicare HMO

## 2023-09-28 ENCOUNTER — Encounter: Payer: Medicare HMO | Admitting: Genetic Counselor

## 2023-10-11 NOTE — Telephone Encounter (Signed)
 Please route this message to the correct office as this is not a [facility] patient.   Copied from CRM [id number]. Topic: Clinical - Medication Prior Auth >> Oct 11, 2023 10:30 AM Julie Oddi wrote: Reason for CRM: [employer] [id number] x. [phone] from [employer] Pre Cert unit called in regarding pre cert for patient. Please contact at [phone] ext. [phone] for P2P review. Must be initated before 5/9 1pm EST. Denial code [phone]

## 2023-10-12 ENCOUNTER — Encounter: Payer: Self-pay | Admitting: Genetic Counselor

## 2023-10-12 ENCOUNTER — Inpatient Hospital Stay: Payer: Medicare HMO | Attending: Internal Medicine | Admitting: Genetic Counselor

## 2023-10-12 ENCOUNTER — Inpatient Hospital Stay: Payer: Medicare HMO

## 2023-10-12 DIAGNOSIS — C7800 Secondary malignant neoplasm of unspecified lung: Secondary | ICD-10-CM

## 2023-10-12 DIAGNOSIS — Z806 Family history of leukemia: Secondary | ICD-10-CM

## 2023-10-12 DIAGNOSIS — C61 Malignant neoplasm of prostate: Secondary | ICD-10-CM

## 2023-10-12 LAB — GENETIC SCREENING ORDER

## 2023-10-12 NOTE — Progress Notes (Signed)
 REFERRING PROVIDER: Lowanda Ruddy, MD   PRIMARY PROVIDER:  Emerson Hanly, MD  PRIMARY REASON FOR VISIT:  1. 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 Family history of leukemia      HISTORY OF PRESENT ILLNESS:   Mr. Uchida, a 68 y.o. male, was seen for a [place] cancer genetics consultation at the request of Dr. Barba Levin due to a personal history of cancer.  Mr. Kalafut presents to clinic today to discuss the possibility of a hereditary predisposition to cancer, genetic testing, and to further clarify his future cancer risks, as well as potential cancer risks for family members.   In 2012, at the age of 81, Mr. Underkoffler was diagnosed with prostate cancer, gleason 8. Treated with prostatectomy and radiation in 2014. He was diagnosed with metastases to the lung in 06/2023, treated with radiation.   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 Three radiotracer avid pulmonary nodules most consistent with prostate cancer pulmonary metastasis. 2. No evidence of local prostate carcinoma recurrence in the prostate fossa. 3. No evidence of metastatic adenopathy in the pelvis or periaortic retroperitoneum. 4. No evidence of skeletal metastasis.   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 No evidence of metastatic disease.    05/11/2023 Surgery   FINAL MICROSCOPIC DIAGNOSIS:   A. LYMPH NODE, LEVEL 9, EXCISION:       One lymph node, negative for metastatic carcinoma (0/1).   B. LYMPH NODE, LEVEL 8, EXCISION:       One lymph node, negative for metastatic carcinoma (0/1).   C. LYMPH NODE, LEVEL 9 #2, EXCISION:       One lymph node, negative for  metastatic carcinoma (0/1).   D. LYMPH NODE, LEVEL 9 #3, EXCISION:       One lymph node, negative for metastatic carcinoma (0/1).   E. LYMPH NODE, LEVEL 7, EXCISION:       One lymph node, negative for metastatic carcinoma (0/1).   F. LYMPH NODE, LEVEL 10, EXCISION:       One lymph node, negative for metastatic carcinoma (0/1).   G. LYMPH NODE, LEVEL 5, EXCISION:       One lymph node, negative for metastatic carcinoma (0/1).   H. LYMPH NODE, LEVEL 11, EXCISION:       One lymph node, negative for metastatic carcinoma (0/1).   I. LYMPH NODE, LEVEL 10 #2, EXCISION:       One lymph node, negative for metastatic carcinoma (0/1).   J. LYMPH NODE, LEVEL 12, EXCISION:       One lymph node, negative for metastatic carcinoma (0/1).   K. LYMPH NODE, LEVEL 11 #2, EXCISION:       One lymph node, negative for metastatic carcinoma (0/1).   L. LUNG, LEFT UPPER LOBE, LOBECTOMY:       Metastatic prostatic adenocarcinoma.       Lymphovascular invasion identified.       Surgical margins of resection are negative for carcinoma.       Seven lymph nodes, negative for metastatic carcinoma (0/7).       See comment.   COMMENT:   The lobectomy  specimen demonstrates a complex glandular proliferation  forming cribriform architecture with multiple satellite foci.  Immunohistochemical stains show the cells are strongly and diffusely  positive for NKX3.1 and are negative for TTF-1. The patients remote  history of prostatic carcinoma is noted. The findings are consistent  with metastasis from the patients known prostatic adenocarcinoma with  Gleason score 4+4=8. Lymphovascular invasion is seen. Surgical margins  are negative for carcinoma.   Controls worked appropriately.    06/19/2023 Cancer Staging   Staging form: Prostate, AJCC 8th Edition - Clinical: Stage IVB (cTX, cN0, pM1a, Grade Group: 4) - Signed by Lowanda Ruddy, MD on 06/19/2023 Gleason score: 8 Histologic grading system: 5 grade system    09/03/2023 PET scan   PSMA PET IMPRESSION: 1. No evidence of prostate cancer recurrence in the prostatectomy bed. 2. No evidence of metastatic adenopathy in the pelvis or periaortic retroperitoneum. 3. No evidence of visceral metastasis or skeletal metastasis. 4. Chronic compression fracture in the lower thoracic spine.      RISK FACTORS:  Colonoscopy reported around 2016, 2017 - reports no polyps  Colonoscopy 2013 - 4 polyps  Dermatology in childhood   Past Medical History:  Diagnosis Date   AP (acute pancreatitis)    due to gallstones, history   Arthritis    Cataract    no surgery, just forming   COVID-19    Diabetes mellitus without complication (HCC)    ED (erectile dysfunction)    GERD (gastroesophageal reflux disease)    Headache(784.0)    migraines   History of hiatal hernia    Hx of migraines    Hyperlipidemia    Hypertension    Prostate cancer (HCC) 01/09/2011   biopsy-gleason 4+4=8   Radiation 07/03/2012-08/21/2012   prostate 6600 cGy 33 sessions   Stress incontinence, male     Past Surgical History:  Procedure Laterality Date   BRONCHIAL BIOPSY  03/27/2023   Procedure: BRONCHIAL BIOPSIES;  Surgeon: Denson Flake, MD;  Location: [facility];  Service: Pulmonary;;   BRONCHIAL BRUSHINGS  03/27/2023   Procedure: BRONCHIAL BRUSHINGS;  Surgeon: Denson Flake, MD;  Location: [facility];  Service: Pulmonary;;   BRONCHIAL NEEDLE ASPIRATION BIOPSY  03/27/2023   Procedure: BRONCHIAL NEEDLE ASPIRATION BIOPSIES;  Surgeon: Denson Flake, MD;  Location: [facility];  Service: Pulmonary;;   CATARACT EXTRACTION Bilateral    CHOLECYSTECTOMY  06/05/1988   CHOLECYSTECTOMY, LAPAROSCOPIC     CYSTOSCOPY WITH BIOPSY N/A 06/12/2013   Procedure: CYSTOSCOPY WITH BIOPSY bladder washing;  Surgeon: Kristeen Peto, MD;  Location: [facility] ORS;  Service: Urology;  Laterality: N/A;  BLADDER BIOPSY CYTOLOGY EXAM UNDER ANESTHESIA     INGUINAL HERNIA REPAIR     bi-lateral    INTERCOSTAL NERVE BLOCK Left 05/11/2023   Procedure: INTERCOSTAL NERVE BLOCK;  Surgeon: Zelphia Higashi, MD;  Location: [facility];  Service: Thoracic;  Laterality: Left;   NODE DISSECTION Left 05/11/2023   Procedure: NODE DISSECTION;  Surgeon: Zelphia Higashi, MD;  Location: [facility] OR;  Service: Thoracic;  Laterality: Left;   PROSTATE SURGERY  03/02/2011   gleason 4+4=8   VIDEO BRONCHOSCOPY WITH RADIAL ENDOBRONCHIAL ULTRASOUND  03/27/2023   Procedure: VIDEO BRONCHOSCOPY WITH RADIAL ENDOBRONCHIAL ULTRASOUND;  Surgeon: Denson Flake, MD;  Location: [facility];  Service: Pulmonary;;    Social History   Socioeconomic History   Marital status: Legally Separated    Spouse name: Not on file   Number of children: 0   Years of education: Not on file  Highest education level: Not on file  Occupational History    Employer: [employer]    Comment: tractor trailer  driver long distance  Tobacco Use   Smoking status: Former    Current packs/day: 0.00    Average packs/day: 1 pack/day for 5.0 years (5.0 ttl pk-yrs)    Types: Cigarettes    Start date: 01/30/1978    Quit date: 01/31/1983    Years since quitting: 40.7   Smokeless tobacco: Never  Vaping Use   Vaping status: Never Used  Substance and Sexual Activity   Alcohol use: No   Drug use: No   Sexual activity: Never  Other Topics Concern   Not on file  Social History Narrative   Not on file   Social Drivers of Health   Financial Resource Strain: Low Risk  (05/03/2022)   Received from [facility]   Overall Financial Resource Strain (CARDIA)    Difficulty of Paying Living Expenses: Not hard at all  Food Insecurity: No Food Insecurity (05/11/2023)   Hunger Vital Sign    Worried About Running Out of Food in the Last Year: Never true    Ran Out of Food in the Last Year: Never true  Transportation Needs: No Transportation Needs (05/11/2023)   PRAPARE - Administrator, Civil Service (Medical): No    Lack of  Transportation (Non-Medical): No  Physical Activity: Insufficiently Active (05/03/2022)   Received from [facility]   Exercise Vital Sign    Days of Exercise per Week: 7 days    Minutes of Exercise per Session: 20 min  Stress: No Stress Concern Present (11/15/2022)   Received from [facility] - Occupational Stress Questionnaire    Feeling of Stress : Only a little  Social Connections: Socially Integrated (05/03/2022)   Received [facility]   Social Network    How would you rate your social network (family, work, friends)?: Good participation with social networks     FAMILY HISTORY:  We obtained a detailed, 4-generation family history.  Significant diagnoses are listed below:    Mr. Duggins is unaware of previous family history of genetic testing for hereditary cancer risks. There is no reported Ashkenazi Jewish ancestry. Mr. Havins reports a maternal first cousin diagnosed with leukemia in childhood, passed away at age 22. He does not report any additional family history of cancer.   GENETIC COUNSELING ASSESSMENT: Mr. Whidbee is a 68 y.o. male with a personal history of cancer which is somewhat suggestive of an inherited predisposition to cancer given his diagnosed of metastatic prostate cancer. We, therefore, discussed and recommended the following at today's visit.   DISCUSSION: We discussed that, in general, most cancer is not inherited in families, but instead is sporadic or familial. Sporadic cancers occur by chance and typically happen at older ages (>50 years) as this type of cancer is caused by genetic changes acquired during an individual's lifetime. Some families have more cancers than would be expected by chance; however, the ages or types of cancer are not consistent with a known genetic mutation or known genetic mutations have been ruled out. This type of familial cancer is thought to be due to a combination of multiple genetic,  environmental, hormonal, and lifestyle factors. While this combination of factors likely increases the risk of cancer, the exact source of this risk is not currently identifiable or testable.  We discussed that 5 - 10% of cancer is hereditary.  We  discussed that testing is beneficial for several reasons including knowing how to follow individuals after completing their treatment, identifying whether potential treatment options such as PARP inhibitors would be beneficial, and understand if other family members could be at risk for cancer and allow them to undergo genetic testing.   We reviewed the characteristics, features and inheritance patterns of hereditary cancer syndromes. We also discussed genetic testing, including the appropriate family members to test, the process of testing, insurance coverage and turn-around-time for results. We discussed the implications of a negative, positive, carrier and/or variant of uncertain significant result. Mr. Finkel  was offered a common hereditary cancer panel (36+ genes) and an expanded pan-cancer panel (70+ genes). Mr. Beisel was informed of the benefits and limitations of each panel, including that expanded pan-cancer panels contain genes that do not have clear management guidelines at this point in time.  We also discussed that as the number of genes included on a panel increases, the chances of variants of uncertain significance increases. Mr. Bedrosian decided to pursue genetic testing for the 40 gene panel. The [facility] CancerNext+RNAinsight Panel includes sequencing, rearrangement analysis, and RNA analysis for the following 39 genes: APC, ATM, BAP1, BARD1, BMPR1A, BRCA1, BRCA2, BRIP1, CDH1, CDKN2A, CHEK2, FH, FLCN, MET, MLH1, MSH2, MSH6, MUTYH, NF1, NTHL1, PALB2, PMS2, PTEN, RAD51C, RAD51D, RPS20, SMAD4, STK11, TP53, TSC1, TSC2, and VHL (sequencing and deletion/duplication); AXIN2, HOXB13, MBD4, MSH3, POLD1 and POLE (sequencing only); EPCAM and GREM1  (deletion/duplication only).    Based on Mr. Bellantoni's personal history of cancer, he meets medical criteria for genetic testing.  Despite that he meets criteria, he may still have an out of pocket cost. We discussed that if his out of pocket cost for testing is over $100, the laboratory will call and confirm whether he wants to proceed with testing.  If the out of pocket cost of testing is less than $100 he will be billed by the genetic testing laboratory.   We discussed that some people do not want to undergo genetic testing due to fear of genetic discrimination.  The Genetic Information Nondiscrimination Act (GINA) was signed into federal law in 2008. GINA prohibits health insurers and most employers from discriminating against individuals based on genetic information (including the results of genetic tests and family history information). According to GINA, health insurance companies cannot consider genetic information to be a preexisting condition, nor can they use it to make decisions regarding coverage or rates. GINA also makes it illegal for most employers to use genetic information in making decisions about hiring, firing, promotion, or terms of employment. It is important to note that GINA does not offer protections for life insurance, disability insurance, or long-term care insurance. GINA does not apply to those in the [employer], those who work for companies with less than 15 employees, and new life insurance or long-term disability insurance policies.  Health status due to a cancer diagnosis is not protected under GINA. More information about GINA can be found by visiting [email or web address].  PLAN: After considering the risks, benefits, and limitations, Mr. Johnes provided informed consent to pursue genetic testing and the blood sample was sent to [facility] for analysis of the CancerNext +RNAinsight test. Results should be available within approximately 2-3 weeks' time, at which point  they will be disclosed by telephone to Mr. Souder, as will any additional recommendations warranted by these results. Mr. Whitehall will receive a summary of his genetic counseling visit and a copy of his results once available. This  information will also be available in Epic.   Lastly, we encouraged Mr. Nearhoof to remain in contact with cancer genetics annually so that we can continuously update the family history and inform him of any changes in cancer genetics and testing that may be of benefit for this family.   Mr. Litchford's questions were answered to his satisfaction today. Our contact information was provided should additional questions or concerns arise. Thank you for the referral and allowing us  to share in the care of your patient.   Jobie Mulders, MS, [facility] Licensed, Retail [email or web address] phone: [phone]  55 minutes were spent on the date of the encounter in service to the patient including preparation, face-to-face consultation, documentation and care coordination.  The patient was seen alone.  Drs. Johnna Nakai, and/or Gudena were available for questions, if needed..    _______________________________________________________________________ For Office Staff:  Number of people involved in session: 1 Was an Intern/ student involved with case: no

## 2023-10-15 ENCOUNTER — Encounter: Payer: Self-pay | Admitting: Cardiology

## 2023-10-15 ENCOUNTER — Ambulatory Visit: Attending: Cardiology | Admitting: Cardiology

## 2023-10-15 VITALS — BP 130/72 | HR 73 | Ht 69.0 in | Wt 172.0 lb

## 2023-10-15 DIAGNOSIS — I9789 Other postprocedural complications and disorders of the circulatory system, not elsewhere classified: Secondary | ICD-10-CM | POA: Diagnosis not present

## 2023-10-15 DIAGNOSIS — I4891 Unspecified atrial fibrillation: Secondary | ICD-10-CM

## 2023-10-15 NOTE — Patient Instructions (Signed)
 Medication Instructions:  Your physician recommends that you continue on your current medications as directed. Please refer to the Current Medication list given to you today.  *If you need a refill on your cardiac medications before your next appointment, please call your pharmacy*  Testing/Procedures: Loop Recorder Implant - this will be done at your next office visit. There are no restrictions or special instructions prior to this visit. Please note that you will not be able to shower for 72 hours after the procedure.    Follow-Up: At [facility], you and your health needs are our priority.  As part of our continuing mission to provide you with exceptional heart care, our providers are all part of one team.  This team includes your primary Cardiologist (physician) and Advanced Practice Providers or APPs (Physician Assistants and Nurse Practitioners) who all work together to provide you with the care you need, when you need it.  Your next appointment:   Schedulers will call you to set up an appointment for your loop recorder implant

## 2023-10-15 NOTE — Progress Notes (Signed)
  Electrophysiology Office Note:   Date:  10/15/2023  ID:  Tylyn Blaustein, DOB [birth year], MRN [id number]  Primary Cardiologist: Olinda Bertrand, DO Primary Heart Failure: None Electrophysiologist: None      History of Present Illness:   Douglas Zhang is a 68 y.o. male with h/o type 2 diabetes, CKD stage IIIa, former tobacco, hypertension, prostate cancer, aortic atherosclerosis, post left upper lobectomy, atrial fibrillation seen today for  for Electrophysiology evaluation of atrial fibrillation at the request of Sunit Tolia.    He underwent PET scan demonstrating a metabolically active nodule in his left upper lobe.  He underwent bronchoscopy in October showing non-small cell lung cancer.  He underwent robotic assisted left upper lobectomy December 2024.  He has been referred to oncology.  MRI brain was negative for metastatic disease.  During his hospital stay he had atrial fibrillation and a flutter.  He was treated with amiodarone  and metoprolol .  He converted to sinus rhythm prior to discharge.  Today, denies symptoms of palpitations, chest pain, shortness of breath, orthopnea, PND, lower extremity edema, claudication, dizziness, presyncope, syncope, bleeding, or neurologic sequela. The patient is tolerating medications without difficulties.    Review of systems complete and found to be negative unless listed in HPI.   EP Information / Studies Reviewed:    EKG is ordered today. Personal review as below.        Risk Assessment/Calculations:    CHA2DS2-VASc Score = 4   This indicates a 4.8% annual risk of stroke. The patient's score is based upon: CHF History: 0 HTN History: 1 Diabetes History: 1 Stroke History: 0 Vascular Disease History: 1 Age Score: 1 Gender Score: 0            Physical Exam:   VS:  BP 130/72   Pulse 73   Ht 5\' 9"  (1.753 m)   Wt 172 lb (78 kg)   SpO2 92%   BMI 25.40 kg/m    Wt Readings from Last 3 Encounters:  10/15/23 172 lb (78 kg)  09/21/23 173 lb  (78.5 kg)  09/10/23 171 lb 3.2 oz (77.7 kg)     GEN: Well nourished, well developed in no acute distress NECK: No JVD; No carotid bruits CARDIAC: Regular rate and rhythm, no murmurs, rubs, gallops RESPIRATORY:  Clear to auscultation without rales, wheezing or rhonchi  ABDOMEN: Soft, non-tender, non-distended EXTREMITIES:  No edema; No deformity   ASSESSMENT AND PLAN:    1.  Paroxysmal atrial fibrillation: On amiodarone .  He had atrial fibrillation around the time of the lung surgery.  We talked about further monitoring for atrial fibrillation to allow him to come off of amiodarone .  He has agreed to ILR implant.  We Jonan Seufert discuss further with his insurance company, and bring him back for a implant.  2.  Aortic atherosclerosis: Plan per primary cardiology  3.  Hypertension: Well-controlled  Follow up with Dr. Lawana Pray pending implant   Signed, Nayleen Janosik Cortland Ding, MD

## 2023-10-17 ENCOUNTER — Encounter: Payer: Self-pay | Admitting: Cardiology

## 2023-10-17 ENCOUNTER — Telehealth: Payer: Self-pay

## 2023-10-17 NOTE — Telephone Encounter (Signed)
 Douglas Zhang rescheduled his appointment.

## 2023-10-17 NOTE — Telephone Encounter (Signed)
 Error

## 2023-10-31 ENCOUNTER — Telehealth: Payer: Self-pay | Admitting: Genetic Counselor

## 2023-10-31 NOTE — Telephone Encounter (Signed)
 LVM asking for call back to review results of genetic testing.

## 2023-11-01 ENCOUNTER — Ambulatory Visit: Payer: Self-pay | Admitting: Genetic Counselor

## 2023-11-01 DIAGNOSIS — Z1379 Encounter for other screening for genetic and chromosomal anomalies: Secondary | ICD-10-CM | POA: Insufficient documentation

## 2023-11-01 NOTE — Telephone Encounter (Signed)
 I spoke to Douglas Zhang to review results of genetic testing. He had genetic testing with [facility] CancerNext +RNAinsight panel. Testing did not identify any variants known to increase the risk for cancer.  Discussed that we do not know why he has cancer or why there is cancer in the family. It could be due to a different gene that we are not testing, or maybe our current technology may not be able to pick something up.  It will be important for him to keep in contact with genetics to keep up with whether additional testing may be needed.  Please see counseling note for further detail on this result.

## 2023-11-01 NOTE — Progress Notes (Signed)
 HPI:  Douglas Zhang was previously seen in the [facility] due to a personal and family history of cancer and concerns regarding a hereditary predisposition to cancer. Please refer to our prior cancer genetics clinic note for more information regarding our discussion, assessment and recommendations, at the time. Douglas Zhang's recent genetic test results were disclosed to him, as were recommendations warranted by these results. These results and recommendations are discussed in more detail below.  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)  02/2011 Pathology Results   Initial diagnosis with a Gleason score of 8 (4+4) +ECE, + Margin involved L lobe from prostatectomy followed by radiation    02/16/2023 PET scan   PSMA PET 1. Three radiotracer avid pulmonary nodules most consistent with prostate cancer pulmonary metastasis. 2. No evidence of local prostate carcinoma recurrence in the prostate fossa. 3. No evidence of metastatic adenopathy in the pelvis or periaortic retroperitoneum. 4. No evidence of skeletal metastasis.   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)   04/18/2023 Imaging   MRI brain: Normal examination. No evidence of metastatic disease.    05/11/2023 Surgery   FINAL MICROSCOPIC DIAGNOSIS:   A. LYMPH NODE, LEVEL 9, EXCISION:       One lymph node, negative for metastatic carcinoma (0/1).   B. LYMPH NODE, LEVEL 8, EXCISION:       One lymph node, negative for metastatic carcinoma (0/1).   C. LYMPH NODE, LEVEL 9 #2, EXCISION:       One lymph node, negative for metastatic carcinoma (0/1).   D. LYMPH NODE, LEVEL 9 #3, EXCISION:       One lymph node, negative for metastatic carcinoma (0/1).   E. LYMPH NODE, LEVEL 7, EXCISION:       One lymph node, negative for metastatic carcinoma (0/1).   F. LYMPH NODE, LEVEL 10, EXCISION:       One lymph node, negative for metastatic  carcinoma (0/1).   G. LYMPH NODE, LEVEL 5, EXCISION:       One lymph node, negative for metastatic carcinoma (0/1).   H. LYMPH NODE, LEVEL 11, EXCISION:       One lymph node, negative for metastatic carcinoma (0/1).   I. LYMPH NODE, LEVEL 10 #2, EXCISION:       One lymph node, negative for metastatic carcinoma (0/1).   J. LYMPH NODE, LEVEL 12, EXCISION:       One lymph node, negative for metastatic carcinoma (0/1).   K. LYMPH NODE, LEVEL 11 #2, EXCISION:       One lymph node, negative for metastatic carcinoma (0/1).   L. LUNG, LEFT UPPER LOBE, LOBECTOMY:       Metastatic prostatic adenocarcinoma.       Lymphovascular invasion identified.       Surgical margins of resection are negative for carcinoma.       Seven lymph nodes, negative for metastatic carcinoma (0/7).       See comment.   COMMENT:   The lobectomy specimen demonstrates a complex glandular proliferation  forming cribriform architecture with multiple satellite foci.  Immunohistochemical stains show the cells are strongly and diffusely  positive for NKX3.1 and are negative for TTF-1. The patients remote  history of prostatic carcinoma is noted. The findings are consistent  with metastasis from the patients known prostatic adenocarcinoma with  Gleason score 4+4=8. Lymphovascular invasion is seen. Surgical margins  are negative for  carcinoma.   Controls worked appropriately.    06/19/2023 Cancer Staging   Staging form: Prostate, AJCC 8th Edition - Clinical: Stage IVB (cTX, cN0, pM1a, Grade Group: 4) - Signed by Lowanda Ruddy, MD on 06/19/2023 Gleason score: 8 Histologic grading system: 5 grade system   09/03/2023 PET scan   PSMA PET IMPRESSION: 1. No evidence of prostate cancer recurrence in the prostatectomy bed. 2. No evidence of metastatic adenopathy in the pelvis or periaortic retroperitoneum. 3. No evidence of visceral metastasis or skeletal metastasis. 4. Chronic compression fracture in the lower thoracic  spine.    Genetic Testing   Negative CancerNext +RNAinsight panel. The [facility] CancerNext+RNAinsight Panel includes sequencing, rearrangement analysis, and RNA analysis for the following 40 genes: APC, ATM, BAP1, BARD1, BMPR1A, BRCA1, BRCA2, BRIP1, CDH1, CDKN2A, CHEK2, FH, FLCN, MET, MLH1, MSH2, MSH6, MUTYH, NF1, NTHL1, PALB2, PMS2, PTEN, RAD51C, RAD51D, RPS20, SMAD4, STK11, TP53, TSC1, TSC2 and VHL (sequencing and deletion/duplication); AXIN2, HOXB13, MBD4, MSH3, POLD1 and POLE (sequencing only); EPCAM and GREM1 (deletion/duplication only). Report date 10/27/23.      FAMILY HISTORY:  We obtained a detailed, 4-generation family history.  Significant diagnoses are listed below: Family History  Problem Relation Age of Onset   Stroke Father    Leukemia Cousin 39   Mr. Komar is unaware of previous family history of genetic testing for hereditary cancer risks. There is no reported Ashkenazi Jewish ancestry. Douglas Zhang reports a maternal first cousin diagnosed with leukemia in childhood, passed away at age 72. He does not report any additional family history of cancer.         GENETIC TEST RESULTS: Genetic testing reported out on 10/27/23 through the [facility] +RNAinsight panel found no pathogenic mutations. The [facility] CancerNext+RNAinsight Panel includes sequencing, rearrangement analysis, and RNA analysis for the following 40 genes: APC, ATM, BAP1, BARD1, BMPR1A, BRCA1, BRCA2, BRIP1, CDH1, CDKN2A, CHEK2, FH, FLCN, MET, MLH1, MSH2, MSH6, MUTYH, NF1, NTHL1, PALB2, PMS2, PTEN, RAD51C, RAD51D, RPS20, SMAD4, STK11, TP53, TSC1, TSC2 and VHL (sequencing and deletion/duplication); AXIN2, HOXB13, MBD4, MSH3, POLD1 and POLE (sequencing only); EPCAM and GREM1 (deletion/duplication only). The test report has been scanned into EPIC and is located under the Molecular Pathology section of the Results Review tab.  A portion of the result report is included below for reference.     We discussed with Mr. Justen that  because current genetic testing is not perfect, it is possible there may be a gene mutation in one of these genes that current testing cannot detect, but that chance is small.  We also discussed, that there could be another gene that has not yet been discovered, or that we have not yet tested, that is responsible for the cancer diagnoses in the family. It is also possible there is a hereditary cause for the cancer in the family that Mr. Vandervelden did not inherit and therefore was not identified in his testing.  Therefore, it is important to remain in touch with cancer genetics in the future so that we can continue to offer Mr. Fantroy the most up to date genetic testing.   ADDITIONAL GENETIC TESTING: We discussed with Mr. Gastineau that there are other genes that are associated with increased cancer risk that can be analyzed. Should Mr. Beggs wish to pursue additional genetic testing, we are happy to discuss and coordinate this testing, at any time.    CANCER SCREENING RECOMMENDATIONS: Mr. Gathright's test result is considered negative (normal).  This means that we have not identified  a hereditary cause for his personal and family history of cancer at this time. Most cancers happen by chance and this negative test suggests that his personal and family history of cancer may fall into this category.    Possible reasons for Mr. Winecoff's negative genetic test include:  1. There may be a gene mutation in one of these genes that current testing methods cannot detect but that chance is small.  2. There could be another gene that has not yet been discovered, or that we have not yet tested, that is responsible for the cancer diagnoses in the family.  3.  There may be no hereditary risk for cancer in the family. The cancers in Mr. Geerts and/or his family may be sporadic/familial or due to other genetic and environmental factors. 4. It is also possible there is a hereditary cause for the cancer in the family that Mr. Tift did not  inherit.  Therefore, it is recommended he continue to follow the cancer management and screening guidelines provided by his oncology and primary healthcare provider. An individual's cancer risk and medical management are not determined by genetic test results alone. Overall cancer risk assessment incorporates additional factors, including personal medical history, family history, and any available genetic information that may result in a personalized plan for cancer prevention and surveillance.  An individual's cancer risk and medical management are not determined by genetic test results alone. Overall cancer risk assessment incorporates additional factors, including personal medical history, family history, and any available genetic information that may result in a personalized plan for cancer prevention and surveillance.  RECOMMENDATIONS FOR FAMILY MEMBERS:  Individuals in this family might be at some increased risk of developing cancer, over the general population risk, simply due to the family history of cancer.  We recommended women in this family have a yearly mammogram beginning at age 41, or 50 years younger than the earliest onset of cancer, an annual clinical breast exam, and perform monthly breast self-exams. Women in this family should also have a gynecological exam as recommended by their primary provider. All family members should be referred for colonoscopy starting at age 19, or 76 years younger than the earliest onset of cancer.  FOLLOW-UP: Lastly, we discussed with Mr. Chesnut that cancer genetics is a rapidly advancing field and it is possible that new genetic tests will be appropriate for him and/or his family members in the future. We encouraged him to remain in contact with cancer genetics on an annual basis so we can update his personal and family histories and let him know of advances in cancer genetics that may benefit this family.   Our contact number was provided. Mr. Funches's  questions were answered to his satisfaction, and he knows he is welcome to call us  at anytime with additional questions or concerns.   Jobie Mulders, MS, [facility] Licensed, Retail [email or web address] [phone]

## 2023-11-10 NOTE — Progress Notes (Unsigned)
 [facility] OFFICE PROGRESS NOTE  Patient Care Team: Emerson Hanly, MD as PCP - General (Family Medicine) Olinda Bertrand, DO as PCP - Cardiology (Cardiology) Lei Pump, MD as PCP - Electrophysiology (Cardiology)  Douglas Zhang is a 68 y.o.male with history of prostate cancer status post prostatectomy, salvage radiation in 2014, DM2, hypertension, hyperlipidemia, GERD, arthritis [facility] for metastatic prostate cancer. He is currently with hormone sensitive metastatic disease.   Current diagnosis: mHSPC Initial diagnosis: [id number] Gleason score of 8 (4+4) +ECE, + Margin involved L lobe from prostatectomy (2012) followed by salvage radiation in 2014 Germline testing: Negative CancerNext +RNAinsight panel.  Somatic testing: not yet Previous Treatment: 2012 RP 2014 Salvage radiation 1/29-07/09/23 SBRT right lung  Last PET in March was NED.  Genetic testing negative. Will continue follow up with Dr. Rozanne Corners for surveillance. Follow up with us  as needed. 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 Rozanne Corners in July.  May continue labs and PSA at [facility]. If rising PSA again quickly, can repeat PET and if still oligometastasis, consider SBRT if feasible If significant disease burden, will consider ADT/ARPI  Follow up as needed. Communicated with Dr. Rozanne Corners.   Lowanda Ruddy, MD  INTERVAL HISTORY: Patient returns for follow-up. Overall feeling well. Recovering well.  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 Three radiotracer avid pulmonary nodules most consistent with prostate cancer pulmonary metastasis. 2. No evidence of local prostate carcinoma recurrence in the prostate  fossa. 3. No evidence of metastatic adenopathy in the pelvis or periaortic retroperitoneum. 4. No evidence of skeletal metastasis.   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 No evidence of metastatic disease.    05/11/2023 Surgery   FINAL MICROSCOPIC DIAGNOSIS:   A. LYMPH NODE, LEVEL 9, EXCISION:       One lymph node, negative for metastatic carcinoma (0/1).   B. LYMPH NODE, LEVEL 8, EXCISION:       One lymph node, negative for metastatic carcinoma (0/1).   C. LYMPH NODE, LEVEL 9 #2, EXCISION:       One lymph node, negative for metastatic carcinoma (0/1).   D. LYMPH NODE, LEVEL 9 #3, EXCISION:       One lymph node, negative for metastatic carcinoma (0/1).   E. LYMPH NODE, LEVEL 7, EXCISION:       One lymph node, negative for metastatic carcinoma (0/1).   F. LYMPH NODE, LEVEL 10, EXCISION:       One lymph node, negative for metastatic carcinoma (0/1).   G. LYMPH NODE, LEVEL 5, EXCISION:       One lymph node, negative for metastatic carcinoma (0/1).   H. LYMPH NODE, LEVEL 11, EXCISION:       One lymph node, negative for metastatic carcinoma (0/1).   I. LYMPH NODE, LEVEL 10 #2, EXCISION:       One lymph node, negative for metastatic carcinoma (0/1).   J. LYMPH NODE, LEVEL 12, EXCISION:       One lymph node, negative for metastatic carcinoma (0/1).   K. LYMPH NODE, LEVEL 11 #2, EXCISION:  One lymph node, negative for metastatic carcinoma (0/1).   L. LUNG, LEFT UPPER LOBE, LOBECTOMY:       Metastatic prostatic adenocarcinoma.       Lymphovascular invasion identified.       Surgical margins of resection are negative for carcinoma.       Seven lymph nodes, negative for metastatic carcinoma (0/7).       See comment.   COMMENT:   The lobectomy specimen demonstrates a complex glandular proliferation  forming cribriform architecture with multiple satellite foci.   Immunohistochemical stains show the cells are strongly and diffusely  positive for NKX3.1 and are negative for TTF-1. The patients remote  history of prostatic carcinoma is noted. The findings are consistent  with metastasis from the patients known prostatic adenocarcinoma with  Gleason score 4+4=8. Lymphovascular invasion is seen. Surgical margins  are negative for carcinoma.   Controls worked appropriately.    06/19/2023 Cancer Staging   Staging form: Prostate, AJCC 8th Edition - Clinical: Stage IVB (cTX, cN0, pM1a, Grade Group: 4) - Signed by Lowanda Ruddy, MD on 06/19/2023 Gleason score: 8 Histologic grading system: 5 grade system   09/03/2023 PET scan   PSMA PET IMPRESSION: 1. No evidence of prostate cancer recurrence in the prostatectomy bed. 2. No evidence of metastatic adenopathy in the pelvis or periaortic retroperitoneum. 3. No evidence of visceral metastasis or skeletal metastasis. 4. Chronic compression fracture in the lower thoracic spine.    Genetic Testing   Negative CancerNext +RNAinsight panel. The [facility] CancerNext+RNAinsight Panel includes sequencing, rearrangement analysis, and RNA analysis for the following 40 genes: APC, ATM, BAP1, BARD1, BMPR1A, BRCA1, BRCA2, BRIP1, CDH1, CDKN2A, CHEK2, FH, FLCN, MET, MLH1, MSH2, MSH6, MUTYH, NF1, NTHL1, PALB2, PMS2, PTEN, RAD51C, RAD51D, RPS20, SMAD4, STK11, TP53, TSC1, TSC2 and VHL (sequencing and deletion/duplication); AXIN2, HOXB13, MBD4, MSH3, POLD1 and POLE (sequencing only); EPCAM and GREM1 (deletion/duplication only). Report date 10/27/23.       PHYSICAL EXAMINATION: ECOG PERFORMANCE STATUS: 0 - Asymptomatic  Vitals:   11/13/23 1029  BP: 137/76  Pulse: 78  Resp: 16  Temp: 97.8 F (36.6 C)  SpO2: 98%   Filed Weights   11/13/23 1029  Weight: 173 lb 6 oz (78.6 kg)    Relevant data reviewed during this visit included lab and results.

## 2023-11-12 ENCOUNTER — Ambulatory Visit: Admitting: Student

## 2023-11-12 ENCOUNTER — Ambulatory Visit

## 2023-11-13 ENCOUNTER — Inpatient Hospital Stay: Attending: Internal Medicine

## 2023-11-13 VITALS — BP 137/76 | HR 78 | Temp 97.8°F | Resp 16 | Ht 69.0 in | Wt 173.4 lb

## 2023-11-13 DIAGNOSIS — M4854XA Collapsed vertebra, not elsewhere classified, thoracic region, initial encounter for fracture: Secondary | ICD-10-CM | POA: Diagnosis not present

## 2023-11-13 DIAGNOSIS — C61 Malignant neoplasm of prostate: Secondary | ICD-10-CM | POA: Diagnosis present

## 2023-11-13 DIAGNOSIS — C7800 Secondary malignant neoplasm of unspecified lung: Secondary | ICD-10-CM | POA: Diagnosis not present

## 2023-11-13 DIAGNOSIS — Z923 Personal history of irradiation: Secondary | ICD-10-CM | POA: Diagnosis not present

## 2023-11-13 NOTE — Assessment & Plan Note (Addendum)
 Has follow up with Dr. Rozanne Corners in July.  May continue labs and PSA at [facility]. If rising PSA again quickly, can repeat PET and if still oligometastasis, consider SBRT if feasible If significant disease burden, will consider ADT/ARPI

## 2023-11-14 NOTE — Progress Notes (Signed)
  Electrophysiology Office Note:   Date:  11/15/2023  ID:  Douglas Zhang, DOB Mar 25, 1956, MRN [id number]  Primary Cardiologist: Olinda Bertrand, DO Electrophysiologist: Lei Pump, MD      History of Present Illness:   Douglas Zhang is a 68 y.o. male with h/o type 2 diabetes, CKD stage IIIa, former tobacco, hypertension, prostate cancer, aortic atherosclerosis, post left upper lobectomy, and atrial fibrillation seen today for routine electrophysiology followup and loop implantation.   Since last being seen in our clinic the patient reports doing well. Overall, he denies chest pain, palpitations, dyspnea, PND, orthopnea, nausea, vomiting, dizziness, syncope, edema, weight gain, or early satiety.   Review of systems complete and found to be negative unless listed in HPI.   EP Information / Studies Reviewed:    EKG is not ordered today. EKG from 10/15/2023 reviewed which showed NSR at 73 bpm        Arrhythmia/Device History No specialty comments available.   Physical Exam:   VS:  BP 114/70   Pulse 84   Ht 5' 9 (1.753 m)   Wt 171 lb 3.2 oz (77.7 kg)   SpO2 96%   BMI 25.28 kg/m    Wt Readings from Last 3 Encounters:  11/15/23 171 lb 3.2 oz (77.7 kg)  11/13/23 173 lb 6 oz (78.6 kg)  10/15/23 172 lb (78 kg)     GEN: No acute distress NECK: No JVD; No carotid bruits CARDIAC: Regular rate and rhythm, no murmurs, rubs, gallops RESPIRATORY:  Clear to auscultation without rales, wheezing or rhonchi  ABDOMEN: Soft, non-tender, non-distended EXTREMITIES:  No edema; No deformity   ASSESSMENT AND PLAN:    Paroxysmal atrial fibrillation  Occurred in the setting of lung surgery.  STOP amiodarone  and will monitor further needs via loop recorder.   I spoke at length with the patient about monitoring for afib with an implantable loop recorder.  Risks, benefits, and alteratives to implantable loop recorder were discussed with the patient today.  HTN Stable on current regimen     Follow up with EP Team in 6 months  Signed, Tylene Galla, PA-C

## 2023-11-15 ENCOUNTER — Encounter: Payer: Self-pay | Admitting: Student

## 2023-11-15 ENCOUNTER — Ambulatory Visit: Attending: Student | Admitting: Student

## 2023-11-15 VITALS — BP 114/70 | HR 84 | Ht 69.0 in | Wt 171.2 lb

## 2023-11-15 DIAGNOSIS — I10 Essential (primary) hypertension: Secondary | ICD-10-CM

## 2023-11-15 DIAGNOSIS — I9789 Other postprocedural complications and disorders of the circulatory system, not elsewhere classified: Secondary | ICD-10-CM

## 2023-11-15 DIAGNOSIS — I4891 Unspecified atrial fibrillation: Secondary | ICD-10-CM

## 2023-11-15 NOTE — Progress Notes (Signed)
 SURGEON:  Tylene Galla, PA-C     PREPROCEDURE DIAGNOSIS:  Atrial fibrillation    POSTPROCEDURE DIAGNOSIS: Atrial fibrillation     PROCEDURES:   1. Implantable loop recorder implantation    INTRODUCTION:  Douglas Zhang presents with a history of atrial fibrillation The costs of loop recorder monitoring have been discussed with the patient.    DESCRIPTION OF PROCEDURE:  Informed written consent was obtained.   Time Out Completed with CMA    The patient required no sedation for the procedure today.  Mapping over the patient's chest was performed to identify the area where electrograms were most prominent for ILR recording.  This area was found to be the left parasternal region over the 4th intercostal space. The patients left chest was therefore prepped and draped in the usual sterile fashion. The skin overlying the left parasternal region was infiltrated with lidocaine  for local analgesia.  A 0.5-cm incision was made over the left parasternal region over the 3rd intercostal space.  A subcutaneous ILR pocket was fashioned using a combination of sharp and blunt dissection.  A Medtronic Reveal LINQ 2 implantable loop recorder (serial # [id number]) was then placed into the pocket  R waves were very prominent and measuring >0.10mV.  Steri- Strips and a sterile dressing were then applied.  There were no early apparent complications.     CONCLUSIONS:   1. Successful implantation of a implantable loop recorder for a history of atrial fibrillation.  2. No early apparent complications.   Tylene Galla, PA-C  Cardiac Electrophysiology

## 2023-11-15 NOTE — Patient Instructions (Addendum)
 Medication Instructions:  STOP Amiodarone . *If you need a refill on your cardiac medications before your next appointment, please call your pharmacy*  Lab Work: No labwork ordered today. If you have labs (blood work) drawn today and your tests are completely normal, you will receive your results only by: MyChart Message (if you have MyChart) OR A paper copy in the mail If you have any lab test that is abnormal or we need to change your treatment, we will call you to review the results.  Testing/Procedures: No testing ordered today  Follow-Up: At [facility], you and your health needs are our priority.  As part of our continuing mission to provide you with exceptional heart care, our providers are all part of one team.  This team includes your primary Cardiologist (physician) and Advanced Practice Providers or APPs (Physician Assistants and Nurse Practitioners) who all work together to provide you with the care you need, when you need it.  Your next appointment:   6 month(s)  Provider:   You may see Will Cortland Ding, MD or one of the following Advanced Practice Providers on your designated Care Team:   Mertha Abrahams, PA-C Malakhai Beitler Andy Zakeya Junker, PA-C Suzann Riddle, NP Creighton Doffing, NP    We recommend signing up for the patient portal called MyChart.  Sign up information is provided on this After Visit Summary.  MyChart is used to connect with patients for Virtual Visits (Telemedicine).  Patients are able to view lab/test results, encounter notes, upcoming appointments, etc.  Non-urgent messages can be sent to your provider as well.   To learn more about what you can do with MyChart, go to [email or web address].    Care After Your Loop Recorder  You have a Medtronic Loop Recorder   Monitor your cardiac device site for redness, swelling, and drainage. Call the device clinic at [phone] if you experience these symptoms or fever/chills.  If you notice bleeding from  your site, hold firm, but gently pressure with two fingers for 5 minutes. Dried blood on the steri-strips when removing the outer bandage is normal.   Keep the large square bandage on your site for 24 hours and then you may remove it yourself. Keep the steri-strips underneath in place.   You may shower after 72 hours / 3 days from your procedure with the steri-strips in place. They will usually fall off on their own, or may be removed after 10 days. Pat dry.   Avoid lotions, ointments, or perfumes over your incision until it is well-healed.  Please do not submerge in water  until your site is completely healed.   Your device is MRI compatible.   Remote monitoring is used to monitor your cardiac device from home. This monitoring is scheduled every month by our office. It allows us  to keep an eye on the function of your device to ensure it is working properly.

## 2023-12-16 ENCOUNTER — Ambulatory Visit

## 2023-12-16 ENCOUNTER — Encounter

## 2023-12-16 DIAGNOSIS — I9789 Other postprocedural complications and disorders of the circulatory system, not elsewhere classified: Secondary | ICD-10-CM

## 2023-12-16 DIAGNOSIS — I4891 Unspecified atrial fibrillation: Secondary | ICD-10-CM

## 2023-12-17 ENCOUNTER — Ambulatory Visit: Payer: Self-pay | Admitting: Cardiology

## 2023-12-17 ENCOUNTER — Encounter

## 2023-12-17 LAB — CUP PACEART REMOTE DEVICE CHECK
Date Time Interrogation Session: 20250714010452
Implantable Pulse Generator Implant Date: 20250612

## 2023-12-18 ENCOUNTER — Encounter

## 2023-12-19 ENCOUNTER — Encounter

## 2023-12-20 ENCOUNTER — Encounter

## 2023-12-21 ENCOUNTER — Encounter

## 2023-12-22 ENCOUNTER — Encounter

## 2023-12-23 ENCOUNTER — Encounter

## 2024-01-16 ENCOUNTER — Ambulatory Visit (INDEPENDENT_AMBULATORY_CARE_PROVIDER_SITE_OTHER)

## 2024-01-16 DIAGNOSIS — I4891 Unspecified atrial fibrillation: Secondary | ICD-10-CM | POA: Diagnosis not present

## 2024-01-16 DIAGNOSIS — I9789 Other postprocedural complications and disorders of the circulatory system, not elsewhere classified: Secondary | ICD-10-CM

## 2024-01-17 LAB — CUP PACEART REMOTE DEVICE CHECK
Date Time Interrogation Session: 20250813234923
Implantable Pulse Generator Implant Date: 20250612

## 2024-01-19 ENCOUNTER — Ambulatory Visit: Payer: Self-pay | Admitting: Cardiology

## 2024-02-16 ENCOUNTER — Ambulatory Visit

## 2024-02-16 DIAGNOSIS — I4891 Unspecified atrial fibrillation: Secondary | ICD-10-CM

## 2024-02-16 DIAGNOSIS — I9789 Other postprocedural complications and disorders of the circulatory system, not elsewhere classified: Secondary | ICD-10-CM

## 2024-02-18 LAB — CUP PACEART REMOTE DEVICE CHECK
Date Time Interrogation Session: 20250912232945
Implantable Pulse Generator Implant Date: 20250612

## 2024-02-19 ENCOUNTER — Ambulatory Visit: Payer: Self-pay | Admitting: Cardiology

## 2024-02-22 NOTE — Progress Notes (Signed)
 Remote Loop Recorder Transmission

## 2024-02-28 NOTE — Progress Notes (Signed)
 Remote Loop Recorder Transmission

## 2024-03-13 NOTE — Progress Notes (Signed)
 Remote Loop Recorder Transmission

## 2024-03-18 ENCOUNTER — Ambulatory Visit (INDEPENDENT_AMBULATORY_CARE_PROVIDER_SITE_OTHER)

## 2024-03-18 DIAGNOSIS — I4891 Unspecified atrial fibrillation: Secondary | ICD-10-CM | POA: Diagnosis not present

## 2024-03-18 DIAGNOSIS — I9789 Other postprocedural complications and disorders of the circulatory system, not elsewhere classified: Secondary | ICD-10-CM

## 2024-03-18 LAB — CUP PACEART REMOTE DEVICE CHECK
Date Time Interrogation Session: 20251013233856
Implantable Pulse Generator Implant Date: 20250612

## 2024-03-19 NOTE — Progress Notes (Signed)
 Remote Loop Recorder Transmission

## 2024-03-21 ENCOUNTER — Ambulatory Visit (HOSPITAL_COMMUNITY)
Admission: RE | Admit: 2024-03-21 | Discharge: 2024-03-21 | Disposition: A | Discharge status: Home / Self Care | Source: Ambulatory Visit | Attending: Cardiology | Admitting: Cardiology

## 2024-03-21 DIAGNOSIS — I700 Atherosclerosis of aorta: Secondary | ICD-10-CM

## 2024-03-21 DIAGNOSIS — I082 Rheumatic disorders of both aortic and tricuspid valves: Secondary | ICD-10-CM | POA: Diagnosis not present

## 2024-03-21 DIAGNOSIS — I2584 Coronary atherosclerosis due to calcified coronary lesion: Secondary | ICD-10-CM

## 2024-03-21 DIAGNOSIS — I2510 Atherosclerotic heart disease of native coronary artery without angina pectoris: Secondary | ICD-10-CM | POA: Diagnosis not present

## 2024-03-21 LAB — ECHOCARDIOGRAM COMPLETE
Area-P 1/2: 2.72 cm2
S' Lateral: 2.3 cm

## 2024-03-29 ENCOUNTER — Ambulatory Visit: Payer: Self-pay | Admitting: Cardiology

## 2024-03-29 DIAGNOSIS — I77810 Thoracic aortic ectasia: Secondary | ICD-10-CM

## 2024-04-01 ENCOUNTER — Ambulatory Visit: Payer: Self-pay | Admitting: Cardiology

## 2024-04-18 ENCOUNTER — Ambulatory Visit

## 2024-04-18 DIAGNOSIS — I4891 Unspecified atrial fibrillation: Secondary | ICD-10-CM | POA: Diagnosis not present

## 2024-04-18 DIAGNOSIS — I9789 Other postprocedural complications and disorders of the circulatory system, not elsewhere classified: Secondary | ICD-10-CM | POA: Diagnosis not present

## 2024-04-20 LAB — CUP PACEART REMOTE DEVICE CHECK
Date Time Interrogation Session: 20251113233929
Implantable Pulse Generator Implant Date: 20250612

## 2024-04-22 NOTE — Progress Notes (Signed)
 Remote Loop Recorder Transmission

## 2024-04-23 ENCOUNTER — Ambulatory Visit: Payer: Self-pay | Admitting: Cardiology

## 2024-05-19 ENCOUNTER — Ambulatory Visit

## 2024-05-19 DIAGNOSIS — I9789 Other postprocedural complications and disorders of the circulatory system, not elsewhere classified: Secondary | ICD-10-CM

## 2024-05-19 DIAGNOSIS — I4891 Unspecified atrial fibrillation: Secondary | ICD-10-CM

## 2024-05-20 LAB — CUP PACEART REMOTE DEVICE CHECK
Date Time Interrogation Session: 20251214234841
Implantable Pulse Generator Implant Date: 20250612

## 2024-05-23 NOTE — Progress Notes (Signed)
 Remote Loop Recorder Transmission

## 2024-06-02 ENCOUNTER — Ambulatory Visit: Payer: Self-pay | Admitting: Cardiology

## 2024-06-19 ENCOUNTER — Ambulatory Visit

## 2024-06-19 DIAGNOSIS — I9789 Other postprocedural complications and disorders of the circulatory system, not elsewhere classified: Secondary | ICD-10-CM

## 2024-06-19 DIAGNOSIS — I4891 Unspecified atrial fibrillation: Secondary | ICD-10-CM

## 2024-06-23 LAB — CUP PACEART REMOTE DEVICE CHECK
Date Time Interrogation Session: 20260117055053
Implantable Pulse Generator Implant Date: 20250612
Zone Setting Status: 755011
Zone Setting Status: 755011
Zone Setting Status: 755011
Zone Setting Status: 755011

## 2024-06-24 ENCOUNTER — Ambulatory Visit: Payer: Self-pay | Admitting: Cardiology

## 2024-06-27 NOTE — Progress Notes (Signed)
 Remote Loop Recorder Transmission

## 2024-07-20 ENCOUNTER — Encounter

## 2024-08-20 ENCOUNTER — Ambulatory Visit: Payer: Self-pay

## 2024-09-20 ENCOUNTER — Ambulatory Visit: Payer: Self-pay

## 2024-10-21 ENCOUNTER — Ambulatory Visit: Payer: Self-pay

## 2024-11-21 ENCOUNTER — Ambulatory Visit: Payer: Self-pay

## 2024-12-22 ENCOUNTER — Ambulatory Visit: Payer: Self-pay

## 2025-01-22 ENCOUNTER — Ambulatory Visit: Payer: Self-pay

## 2025-02-22 ENCOUNTER — Ambulatory Visit: Payer: Self-pay

## 2025-03-25 ENCOUNTER — Ambulatory Visit: Payer: Self-pay
